# Patient Record
Sex: Male | Born: 1955 | Race: White | Hispanic: No | Marital: Married | Smoking: Never smoker
Health system: Southern US, Community
[De-identification: ages and names within clinical notes are randomized; demographics above are authoritative.]

## PROBLEM LIST (undated history)

## (undated) DIAGNOSIS — Z860100 Personal history of colon polyps, unspecified: Secondary | ICD-10-CM

## (undated) DIAGNOSIS — T7840XA Allergy, unspecified, initial encounter: Secondary | ICD-10-CM

## (undated) DIAGNOSIS — E785 Hyperlipidemia, unspecified: Secondary | ICD-10-CM

## (undated) DIAGNOSIS — I341 Nonrheumatic mitral (valve) prolapse: Secondary | ICD-10-CM

## (undated) DIAGNOSIS — Q211 Atrial septal defect: Secondary | ICD-10-CM

## (undated) DIAGNOSIS — I2510 Atherosclerotic heart disease of native coronary artery without angina pectoris: Secondary | ICD-10-CM

## (undated) DIAGNOSIS — K5900 Constipation, unspecified: Secondary | ICD-10-CM

## (undated) DIAGNOSIS — Z5189 Encounter for other specified aftercare: Secondary | ICD-10-CM

## (undated) DIAGNOSIS — H269 Unspecified cataract: Secondary | ICD-10-CM

## (undated) DIAGNOSIS — I4891 Unspecified atrial fibrillation: Secondary | ICD-10-CM

## (undated) DIAGNOSIS — R011 Cardiac murmur, unspecified: Secondary | ICD-10-CM

## (undated) DIAGNOSIS — N200 Calculus of kidney: Secondary | ICD-10-CM

## (undated) DIAGNOSIS — G4730 Sleep apnea, unspecified: Secondary | ICD-10-CM

## (undated) DIAGNOSIS — M722 Plantar fascial fibromatosis: Secondary | ICD-10-CM

## (undated) DIAGNOSIS — Z8679 Personal history of other diseases of the circulatory system: Secondary | ICD-10-CM

## (undated) DIAGNOSIS — Q2112 Patent foramen ovale: Secondary | ICD-10-CM

## (undated) DIAGNOSIS — I019 Acute rheumatic heart disease, unspecified: Secondary | ICD-10-CM

## (undated) HISTORY — DX: Acute rheumatic heart disease, unspecified: I01.9

## (undated) HISTORY — DX: Nonrheumatic mitral (valve) prolapse: I34.1

## (undated) HISTORY — DX: Atrial septal defect: Q21.1

## (undated) HISTORY — PX: COLONOSCOPY: SHX174

## (undated) HISTORY — DX: Constipation, unspecified: K59.00

## (undated) HISTORY — DX: Patent foramen ovale: Q21.12

## (undated) HISTORY — DX: Hyperlipidemia, unspecified: E78.5

## (undated) HISTORY — DX: Personal history of colonic polyps: Z86.010

## (undated) HISTORY — DX: Allergy, unspecified, initial encounter: T78.40XA

## (undated) HISTORY — DX: Plantar fascial fibromatosis: M72.2

## (undated) HISTORY — DX: Encounter for other specified aftercare: Z51.89

## (undated) HISTORY — DX: Atherosclerotic heart disease of native coronary artery without angina pectoris: I25.10

## (undated) HISTORY — DX: Unspecified cataract: H26.9

## (undated) HISTORY — PX: POLYPECTOMY: SHX149

## (undated) HISTORY — DX: Unspecified atrial fibrillation: I48.91

## (undated) HISTORY — DX: Sleep apnea, unspecified: G47.30

## (undated) HISTORY — DX: Personal history of colon polyps, unspecified: Z86.0100

## (undated) HISTORY — DX: Personal history of other diseases of the circulatory system: Z86.79

## (undated) HISTORY — DX: Calculus of kidney: N20.0

## (undated) HISTORY — DX: Cardiac murmur, unspecified: R01.1

---

## 2001-05-24 ENCOUNTER — Emergency Department (HOSPITAL_COMMUNITY): Admission: EM | Admit: 2001-05-24 | Discharge: 2001-05-24 | Payer: Self-pay | Admitting: Emergency Medicine

## 2001-10-04 ENCOUNTER — Encounter: Payer: Self-pay | Admitting: Emergency Medicine

## 2001-10-04 ENCOUNTER — Emergency Department (HOSPITAL_COMMUNITY): Admission: EM | Admit: 2001-10-04 | Discharge: 2001-10-04 | Payer: Self-pay | Admitting: Emergency Medicine

## 2002-06-23 ENCOUNTER — Emergency Department (HOSPITAL_COMMUNITY): Admission: EM | Admit: 2002-06-23 | Discharge: 2002-06-23 | Payer: Self-pay | Admitting: Emergency Medicine

## 2003-04-02 ENCOUNTER — Encounter: Payer: Self-pay | Admitting: Emergency Medicine

## 2003-04-02 ENCOUNTER — Emergency Department (HOSPITAL_COMMUNITY): Admission: EM | Admit: 2003-04-02 | Discharge: 2003-04-02 | Payer: Self-pay | Admitting: Emergency Medicine

## 2003-05-03 ENCOUNTER — Encounter: Payer: Self-pay | Admitting: Emergency Medicine

## 2003-05-03 ENCOUNTER — Emergency Department (HOSPITAL_COMMUNITY): Admission: EM | Admit: 2003-05-03 | Discharge: 2003-05-03 | Payer: Self-pay | Admitting: *Deleted

## 2004-05-13 ENCOUNTER — Ambulatory Visit (HOSPITAL_COMMUNITY): Admission: RE | Admit: 2004-05-13 | Discharge: 2004-05-13 | Payer: Self-pay | Admitting: General Surgery

## 2006-11-04 ENCOUNTER — Encounter: Admission: RE | Admit: 2006-11-04 | Discharge: 2006-11-04 | Payer: Self-pay | Admitting: Family Medicine

## 2008-03-06 ENCOUNTER — Ambulatory Visit: Payer: Self-pay | Admitting: Internal Medicine

## 2008-03-06 ENCOUNTER — Encounter: Payer: Self-pay | Admitting: Internal Medicine

## 2008-03-06 DIAGNOSIS — J309 Allergic rhinitis, unspecified: Secondary | ICD-10-CM

## 2008-03-06 DIAGNOSIS — R079 Chest pain, unspecified: Secondary | ICD-10-CM

## 2008-03-06 DIAGNOSIS — I059 Rheumatic mitral valve disease, unspecified: Secondary | ICD-10-CM | POA: Insufficient documentation

## 2008-03-06 DIAGNOSIS — R5381 Other malaise: Secondary | ICD-10-CM | POA: Insufficient documentation

## 2008-03-06 DIAGNOSIS — R5383 Other fatigue: Secondary | ICD-10-CM

## 2008-03-06 DIAGNOSIS — R011 Cardiac murmur, unspecified: Secondary | ICD-10-CM

## 2008-03-06 DIAGNOSIS — R519 Headache, unspecified: Secondary | ICD-10-CM | POA: Insufficient documentation

## 2008-03-06 DIAGNOSIS — Z87442 Personal history of urinary calculi: Secondary | ICD-10-CM

## 2008-03-06 DIAGNOSIS — R51 Headache: Secondary | ICD-10-CM

## 2008-03-06 DIAGNOSIS — F528 Other sexual dysfunction not due to a substance or known physiological condition: Secondary | ICD-10-CM | POA: Insufficient documentation

## 2008-03-06 DIAGNOSIS — E785 Hyperlipidemia, unspecified: Secondary | ICD-10-CM

## 2008-03-06 LAB — CONVERTED CEMR LAB
Blood in Urine, dipstick: NEGATIVE
Ketones, urine, test strip: NEGATIVE
Nitrite: NEGATIVE
Urobilinogen, UA: 0.2
WBC Urine, dipstick: NEGATIVE

## 2008-03-09 ENCOUNTER — Encounter: Payer: Self-pay | Admitting: Internal Medicine

## 2008-04-05 LAB — CONVERTED CEMR LAB
ALT: 30 units/L (ref 0–53)
AST: 25 units/L (ref 0–37)
Basophils Relative: 0.7 % (ref 0.0–1.0)
Bilirubin, Direct: 0.2 mg/dL (ref 0.0–0.3)
CO2: 33 meq/L — ABNORMAL HIGH (ref 19–32)
Calcium: 9.8 mg/dL (ref 8.4–10.5)
Chloride: 105 meq/L (ref 96–112)
Creatinine, Ser: 1 mg/dL (ref 0.4–1.5)
Eosinophils Relative: 2.2 % (ref 0.0–5.0)
Glucose, Bld: 92 mg/dL (ref 70–99)
HDL: 49.7 mg/dL (ref >39.0)
Platelets: 199 10*3/uL (ref 150–400)
RBC: 4.99 M/uL (ref 4.22–5.81)
TSH: 1.67 microintl units/mL (ref 0.35–5.50)
Total Protein: 7.1 g/dL (ref 6.0–8.3)
Triglycerides: 94 mg/dL (ref 0–149)
VLDL: 19 mg/dL (ref 0–40)
WBC: 5.2 10*3/uL (ref 4.5–10.5)

## 2008-04-28 ENCOUNTER — Encounter: Payer: Self-pay | Admitting: Internal Medicine

## 2008-09-29 ENCOUNTER — Telehealth (INDEPENDENT_AMBULATORY_CARE_PROVIDER_SITE_OTHER): Payer: Self-pay | Admitting: *Deleted

## 2008-11-06 ENCOUNTER — Ambulatory Visit: Payer: Self-pay | Admitting: Internal Medicine

## 2008-11-06 DIAGNOSIS — R12 Heartburn: Secondary | ICD-10-CM

## 2008-11-06 LAB — CONVERTED CEMR LAB
Ketones, urine, test strip: NEGATIVE
Nitrite: NEGATIVE
Specific Gravity, Urine: 1.015
Urobilinogen, UA: 0.2

## 2008-12-01 ENCOUNTER — Encounter: Payer: Self-pay | Admitting: Internal Medicine

## 2008-12-01 ENCOUNTER — Ambulatory Visit: Payer: Self-pay

## 2008-12-01 ENCOUNTER — Ambulatory Visit: Payer: Self-pay | Admitting: Cardiology

## 2008-12-11 ENCOUNTER — Ambulatory Visit: Payer: Self-pay | Admitting: Cardiovascular Disease

## 2008-12-11 ENCOUNTER — Ambulatory Visit (HOSPITAL_COMMUNITY): Admission: RE | Admit: 2008-12-11 | Discharge: 2008-12-11 | Payer: Self-pay | Admitting: Cardiology

## 2008-12-12 ENCOUNTER — Encounter: Payer: Self-pay | Admitting: Internal Medicine

## 2008-12-12 LAB — CONVERTED CEMR LAB
AST: 23 units/L (ref 0–37)
Alkaline Phosphatase: 46 units/L (ref 39–117)
Basophils Absolute: 0 10*3/uL (ref 0.0–0.1)
Bilirubin, Direct: 0.1 mg/dL (ref 0.0–0.3)
Chloride: 105 meq/L (ref 96–112)
Eosinophils Absolute: 0.2 10*3/uL (ref 0.0–0.7)
Eosinophils Relative: 3 % (ref 0.0–5.0)
GFR calc non Af Amer: 95 mL/min
HDL: 41.6 mg/dL (ref >39.0)
MCHC: 34.8 g/dL (ref 30.0–36.0)
MCV: 90 fL (ref 78.0–100.0)
Neutrophils Relative %: 52.4 % (ref 43.0–77.0)
Platelets: 172 10*3/uL (ref 150–400)
Potassium: 4.1 meq/L (ref 3.5–5.1)
Sodium: 140 meq/L (ref 135–145)
Total Bilirubin: 0.9 mg/dL (ref 0.3–1.2)
Triglycerides: 100 mg/dL (ref 0–149)
WBC: 5.1 10*3/uL (ref 4.5–10.5)

## 2008-12-14 ENCOUNTER — Ambulatory Visit: Payer: Self-pay | Admitting: Cardiology

## 2008-12-19 ENCOUNTER — Inpatient Hospital Stay (HOSPITAL_BASED_OUTPATIENT_CLINIC_OR_DEPARTMENT_OTHER): Admission: RE | Admit: 2008-12-19 | Discharge: 2008-12-19 | Payer: Self-pay | Admitting: Cardiology

## 2008-12-19 ENCOUNTER — Ambulatory Visit: Payer: Self-pay | Admitting: Cardiology

## 2008-12-22 DIAGNOSIS — Z5189 Encounter for other specified aftercare: Secondary | ICD-10-CM

## 2008-12-22 HISTORY — PX: THORACOTOMY: SUR1349

## 2008-12-22 HISTORY — DX: Encounter for other specified aftercare: Z51.89

## 2008-12-22 HISTORY — PX: PATENT FORAMEN OVALE CLOSURE: SHX2181

## 2008-12-25 ENCOUNTER — Ambulatory Visit: Payer: Self-pay | Admitting: Thoracic Surgery (Cardiothoracic Vascular Surgery)

## 2009-01-19 ENCOUNTER — Ambulatory Visit (HOSPITAL_COMMUNITY)
Admission: RE | Admit: 2009-01-19 | Discharge: 2009-01-19 | Payer: Self-pay | Admitting: Thoracic Surgery (Cardiothoracic Vascular Surgery)

## 2009-01-19 ENCOUNTER — Ambulatory Visit: Payer: Self-pay | Admitting: Vascular Surgery

## 2009-01-19 ENCOUNTER — Encounter: Payer: Self-pay | Admitting: Thoracic Surgery (Cardiothoracic Vascular Surgery)

## 2009-01-22 ENCOUNTER — Ambulatory Visit: Payer: Self-pay | Admitting: Thoracic Surgery (Cardiothoracic Vascular Surgery)

## 2009-01-23 ENCOUNTER — Inpatient Hospital Stay (HOSPITAL_COMMUNITY)
Admission: RE | Admit: 2009-01-23 | Discharge: 2009-01-27 | Payer: Self-pay | Admitting: Thoracic Surgery (Cardiothoracic Vascular Surgery)

## 2009-01-23 ENCOUNTER — Encounter: Payer: Self-pay | Admitting: Thoracic Surgery (Cardiothoracic Vascular Surgery)

## 2009-01-23 ENCOUNTER — Ambulatory Visit: Payer: Self-pay | Admitting: Thoracic Surgery (Cardiothoracic Vascular Surgery)

## 2009-01-30 ENCOUNTER — Ambulatory Visit: Payer: Self-pay | Admitting: Thoracic Surgery (Cardiothoracic Vascular Surgery)

## 2009-01-30 ENCOUNTER — Ambulatory Visit: Payer: Self-pay | Admitting: Cardiology

## 2009-01-30 ENCOUNTER — Encounter: Payer: Self-pay | Admitting: Internal Medicine

## 2009-02-05 ENCOUNTER — Encounter
Admission: RE | Admit: 2009-02-05 | Discharge: 2009-02-05 | Payer: Self-pay | Admitting: Thoracic Surgery (Cardiothoracic Vascular Surgery)

## 2009-02-05 ENCOUNTER — Ambulatory Visit: Payer: Self-pay | Admitting: Thoracic Surgery (Cardiothoracic Vascular Surgery)

## 2009-02-05 ENCOUNTER — Ambulatory Visit: Payer: Self-pay | Admitting: Internal Medicine

## 2009-02-05 ENCOUNTER — Encounter: Payer: Self-pay | Admitting: Internal Medicine

## 2009-02-10 DIAGNOSIS — I080 Rheumatic disorders of both mitral and aortic valves: Secondary | ICD-10-CM

## 2009-02-10 DIAGNOSIS — Q211 Atrial septal defect: Secondary | ICD-10-CM

## 2009-02-10 DIAGNOSIS — I4891 Unspecified atrial fibrillation: Secondary | ICD-10-CM

## 2009-02-10 HISTORY — DX: Unspecified atrial fibrillation: I48.91

## 2009-02-12 ENCOUNTER — Encounter: Payer: Self-pay | Admitting: Cardiology

## 2009-02-12 ENCOUNTER — Ambulatory Visit: Payer: Self-pay | Admitting: Cardiology

## 2009-02-12 ENCOUNTER — Ambulatory Visit: Payer: Self-pay | Admitting: Cardiovascular Disease

## 2009-02-26 ENCOUNTER — Ambulatory Visit: Payer: Self-pay | Admitting: Internal Medicine

## 2009-03-01 ENCOUNTER — Encounter: Payer: Self-pay | Admitting: Cardiology

## 2009-03-01 ENCOUNTER — Encounter (HOSPITAL_COMMUNITY): Admission: RE | Admit: 2009-03-01 | Discharge: 2009-05-30 | Payer: Self-pay | Admitting: Cardiology

## 2009-03-05 ENCOUNTER — Encounter: Payer: Self-pay | Admitting: Internal Medicine

## 2009-03-05 ENCOUNTER — Ambulatory Visit: Payer: Self-pay | Admitting: Thoracic Surgery (Cardiothoracic Vascular Surgery)

## 2009-03-19 ENCOUNTER — Ambulatory Visit: Payer: Self-pay | Admitting: Internal Medicine

## 2009-03-26 ENCOUNTER — Ambulatory Visit: Payer: Self-pay | Admitting: Internal Medicine

## 2009-03-26 DIAGNOSIS — M79609 Pain in unspecified limb: Secondary | ICD-10-CM | POA: Insufficient documentation

## 2009-03-27 ENCOUNTER — Encounter: Payer: Self-pay | Admitting: Internal Medicine

## 2009-04-02 ENCOUNTER — Ambulatory Visit: Payer: Self-pay | Admitting: Internal Medicine

## 2009-04-13 ENCOUNTER — Ambulatory Visit: Payer: Self-pay | Admitting: Cardiology

## 2009-04-20 ENCOUNTER — Ambulatory Visit: Payer: Self-pay | Admitting: Cardiology

## 2009-04-20 ENCOUNTER — Encounter: Payer: Self-pay | Admitting: Cardiology

## 2009-04-25 ENCOUNTER — Ambulatory Visit: Payer: Self-pay | Admitting: Internal Medicine

## 2009-04-25 ENCOUNTER — Encounter: Payer: Self-pay | Admitting: Cardiology

## 2009-04-25 DIAGNOSIS — R635 Abnormal weight gain: Secondary | ICD-10-CM | POA: Insufficient documentation

## 2009-04-27 ENCOUNTER — Ambulatory Visit: Payer: Self-pay | Admitting: Cardiovascular Disease

## 2009-05-04 ENCOUNTER — Telehealth: Payer: Self-pay | Admitting: Cardiology

## 2009-05-04 LAB — CONVERTED CEMR LAB
Alkaline Phosphatase: 59 units/L (ref 39–117)
BUN: 16 mg/dL (ref 6–23)
Basophils Relative: 0.1 % (ref 0.0–3.0)
Bilirubin, Direct: 0 mg/dL (ref 0.0–0.3)
Calcium: 9.2 mg/dL (ref 8.4–10.5)
Cholesterol: 232 mg/dL — ABNORMAL HIGH (ref 0–200)
Creatinine, Ser: 1 mg/dL (ref 0.4–1.5)
Eosinophils Absolute: 0.1 10*3/uL (ref 0.0–0.7)
Eosinophils Relative: 2.7 % (ref 0.0–5.0)
Free T4: 0.9 ng/dL (ref 0.6–1.6)
Lymphocytes Relative: 35.9 % (ref 12.0–46.0)
MCHC: 33.7 g/dL (ref 30.0–36.0)
Neutrophils Relative %: 53.7 % (ref 43.0–77.0)
Platelets: 185 10*3/uL (ref 150.0–400.0)
RBC: 4.88 M/uL (ref 4.22–5.81)
T3, Free: 3.3 pg/mL (ref 2.3–4.2)
Total Bilirubin: 1.1 mg/dL (ref 0.3–1.2)
Total CHOL/HDL Ratio: 6
Total Protein: 7.2 g/dL (ref 6.0–8.3)
Triglycerides: 249 mg/dL — ABNORMAL HIGH (ref 0.0–149.0)
VLDL: 49.8 mg/dL — ABNORMAL HIGH (ref 0.0–40.0)
WBC: 5.2 10*3/uL (ref 4.5–10.5)

## 2009-05-09 ENCOUNTER — Encounter: Payer: Self-pay | Admitting: Cardiology

## 2009-05-09 ENCOUNTER — Ambulatory Visit: Payer: Self-pay | Admitting: Cardiology

## 2009-05-10 ENCOUNTER — Ambulatory Visit: Payer: Self-pay | Admitting: Cardiology

## 2009-05-23 ENCOUNTER — Ambulatory Visit: Payer: Self-pay | Admitting: Cardiology

## 2009-05-23 ENCOUNTER — Encounter: Payer: Self-pay | Admitting: *Deleted

## 2009-05-23 LAB — CONVERTED CEMR LAB
POC INR: 2
Protime: 17.6

## 2009-05-28 ENCOUNTER — Encounter: Payer: Self-pay | Admitting: Cardiology

## 2009-05-28 ENCOUNTER — Ambulatory Visit: Payer: Self-pay

## 2009-05-29 ENCOUNTER — Encounter: Payer: Self-pay | Admitting: Cardiology

## 2009-05-31 ENCOUNTER — Encounter (HOSPITAL_COMMUNITY): Admission: RE | Admit: 2009-05-31 | Discharge: 2009-06-19 | Payer: Self-pay | Admitting: Cardiology

## 2009-06-01 ENCOUNTER — Telehealth: Payer: Self-pay | Admitting: Cardiology

## 2009-06-07 ENCOUNTER — Encounter: Payer: Self-pay | Admitting: Cardiology

## 2009-06-21 ENCOUNTER — Encounter (HOSPITAL_COMMUNITY): Admission: RE | Admit: 2009-06-21 | Discharge: 2009-06-26 | Payer: Self-pay | Admitting: Cardiology

## 2009-06-27 ENCOUNTER — Encounter: Payer: Self-pay | Admitting: *Deleted

## 2009-07-18 ENCOUNTER — Encounter: Payer: Self-pay | Admitting: Cardiology

## 2009-07-18 ENCOUNTER — Ambulatory Visit: Payer: Self-pay | Admitting: Thoracic Surgery (Cardiothoracic Vascular Surgery)

## 2009-07-18 ENCOUNTER — Encounter: Payer: Self-pay | Admitting: Internal Medicine

## 2009-08-03 ENCOUNTER — Encounter: Payer: Self-pay | Admitting: Cardiology

## 2009-08-06 ENCOUNTER — Encounter (INDEPENDENT_AMBULATORY_CARE_PROVIDER_SITE_OTHER): Payer: Self-pay | Admitting: *Deleted

## 2009-08-28 ENCOUNTER — Encounter: Payer: Self-pay | Admitting: *Deleted

## 2009-10-11 ENCOUNTER — Telehealth: Payer: Self-pay | Admitting: Internal Medicine

## 2009-10-12 ENCOUNTER — Ambulatory Visit: Payer: Self-pay | Admitting: Internal Medicine

## 2009-10-12 DIAGNOSIS — R0989 Other specified symptoms and signs involving the circulatory and respiratory systems: Secondary | ICD-10-CM

## 2009-10-12 DIAGNOSIS — R6882 Decreased libido: Secondary | ICD-10-CM

## 2009-10-12 DIAGNOSIS — R0609 Other forms of dyspnea: Secondary | ICD-10-CM

## 2009-10-12 LAB — CONVERTED CEMR LAB
AST: 28 units/L (ref 0–37)
Albumin: 4.2 g/dL (ref 3.5–5.2)
Alkaline Phosphatase: 58 units/L (ref 39–117)
BUN: 16 mg/dL (ref 6–23)
Basophils Absolute: 0 10*3/uL (ref 0.0–0.1)
Calcium: 9 mg/dL (ref 8.4–10.5)
Creatinine, Ser: 1 mg/dL (ref 0.4–1.5)
Eosinophils Relative: 3 % (ref 0.0–5.0)
GFR calc non Af Amer: 83.12 mL/min (ref >60)
Glucose, Bld: 101 mg/dL — ABNORMAL HIGH (ref 70–99)
HDL: 43.3 mg/dL (ref >39.00)
LDL Cholesterol: 121 mg/dL — ABNORMAL HIGH (ref 0–99)
Lymphocytes Relative: 36.3 % (ref 12.0–46.0)
Lymphs Abs: 1.7 10*3/uL (ref 0.7–4.0)
Monocytes Relative: 6.1 % (ref 3.0–12.0)
Neutrophils Relative %: 54.3 % (ref 43.0–77.0)
Platelets: 180 10*3/uL (ref 150.0–400.0)
Potassium: 4.8 meq/L (ref 3.5–5.1)
RDW: 12.3 % (ref 11.5–14.6)
TSH: 0.67 microintl units/mL (ref 0.35–5.50)
Testosterone: 220.67 ng/dL — ABNORMAL LOW (ref 350.00–890.00)
Total Bilirubin: 1.1 mg/dL (ref 0.3–1.2)
Total CK: 127 units/L (ref 7–232)
VLDL: 22.4 mg/dL (ref 0.0–40.0)
Vitamin B-12: 536 pg/mL (ref 211–911)
WBC: 4.8 10*3/uL (ref 4.5–10.5)

## 2009-10-14 DIAGNOSIS — E291 Testicular hypofunction: Secondary | ICD-10-CM

## 2009-10-17 LAB — CONVERTED CEMR LAB: PSA: 0.69 ng/mL (ref 0.10–4.00)

## 2009-10-24 ENCOUNTER — Ambulatory Visit: Payer: Self-pay | Admitting: Pulmonary Disease

## 2009-10-24 DIAGNOSIS — G4710 Hypersomnia, unspecified: Secondary | ICD-10-CM

## 2009-11-08 ENCOUNTER — Ambulatory Visit: Payer: Self-pay | Admitting: Internal Medicine

## 2009-11-23 ENCOUNTER — Encounter: Payer: Self-pay | Admitting: Pulmonary Disease

## 2009-11-23 ENCOUNTER — Ambulatory Visit (HOSPITAL_BASED_OUTPATIENT_CLINIC_OR_DEPARTMENT_OTHER): Admission: RE | Admit: 2009-11-23 | Discharge: 2009-11-23 | Payer: Self-pay | Admitting: Pulmonary Disease

## 2009-12-06 ENCOUNTER — Ambulatory Visit: Payer: Self-pay | Admitting: Pulmonary Disease

## 2009-12-06 ENCOUNTER — Telehealth (INDEPENDENT_AMBULATORY_CARE_PROVIDER_SITE_OTHER): Payer: Self-pay | Admitting: *Deleted

## 2009-12-12 ENCOUNTER — Encounter (INDEPENDENT_AMBULATORY_CARE_PROVIDER_SITE_OTHER): Payer: Self-pay | Admitting: *Deleted

## 2009-12-13 ENCOUNTER — Ambulatory Visit: Payer: Self-pay | Admitting: Gastroenterology

## 2009-12-13 ENCOUNTER — Ambulatory Visit: Payer: Self-pay | Admitting: Pulmonary Disease

## 2009-12-13 DIAGNOSIS — G4733 Obstructive sleep apnea (adult) (pediatric): Secondary | ICD-10-CM

## 2009-12-19 ENCOUNTER — Telehealth: Payer: Self-pay | Admitting: *Deleted

## 2009-12-20 ENCOUNTER — Ambulatory Visit: Payer: Self-pay | Admitting: Gastroenterology

## 2010-01-10 ENCOUNTER — Encounter: Payer: Self-pay | Admitting: Gastroenterology

## 2010-01-11 ENCOUNTER — Ambulatory Visit: Payer: Self-pay | Admitting: Internal Medicine

## 2010-01-11 LAB — CONVERTED CEMR LAB: Testosterone: 225.57 ng/dL — ABNORMAL LOW (ref 350.00–890.00)

## 2010-01-28 ENCOUNTER — Ambulatory Visit: Payer: Self-pay | Admitting: Internal Medicine

## 2010-02-18 IMAGING — CR DG CHEST 1V PORT
1 series · 1 of 1 positions shown · non-contrast
Comparison: 01/23/2009

CLINICAL DATA: Mitral regurgitation

PORTABLE CHEST - 1 VIEW

[view not recorded]
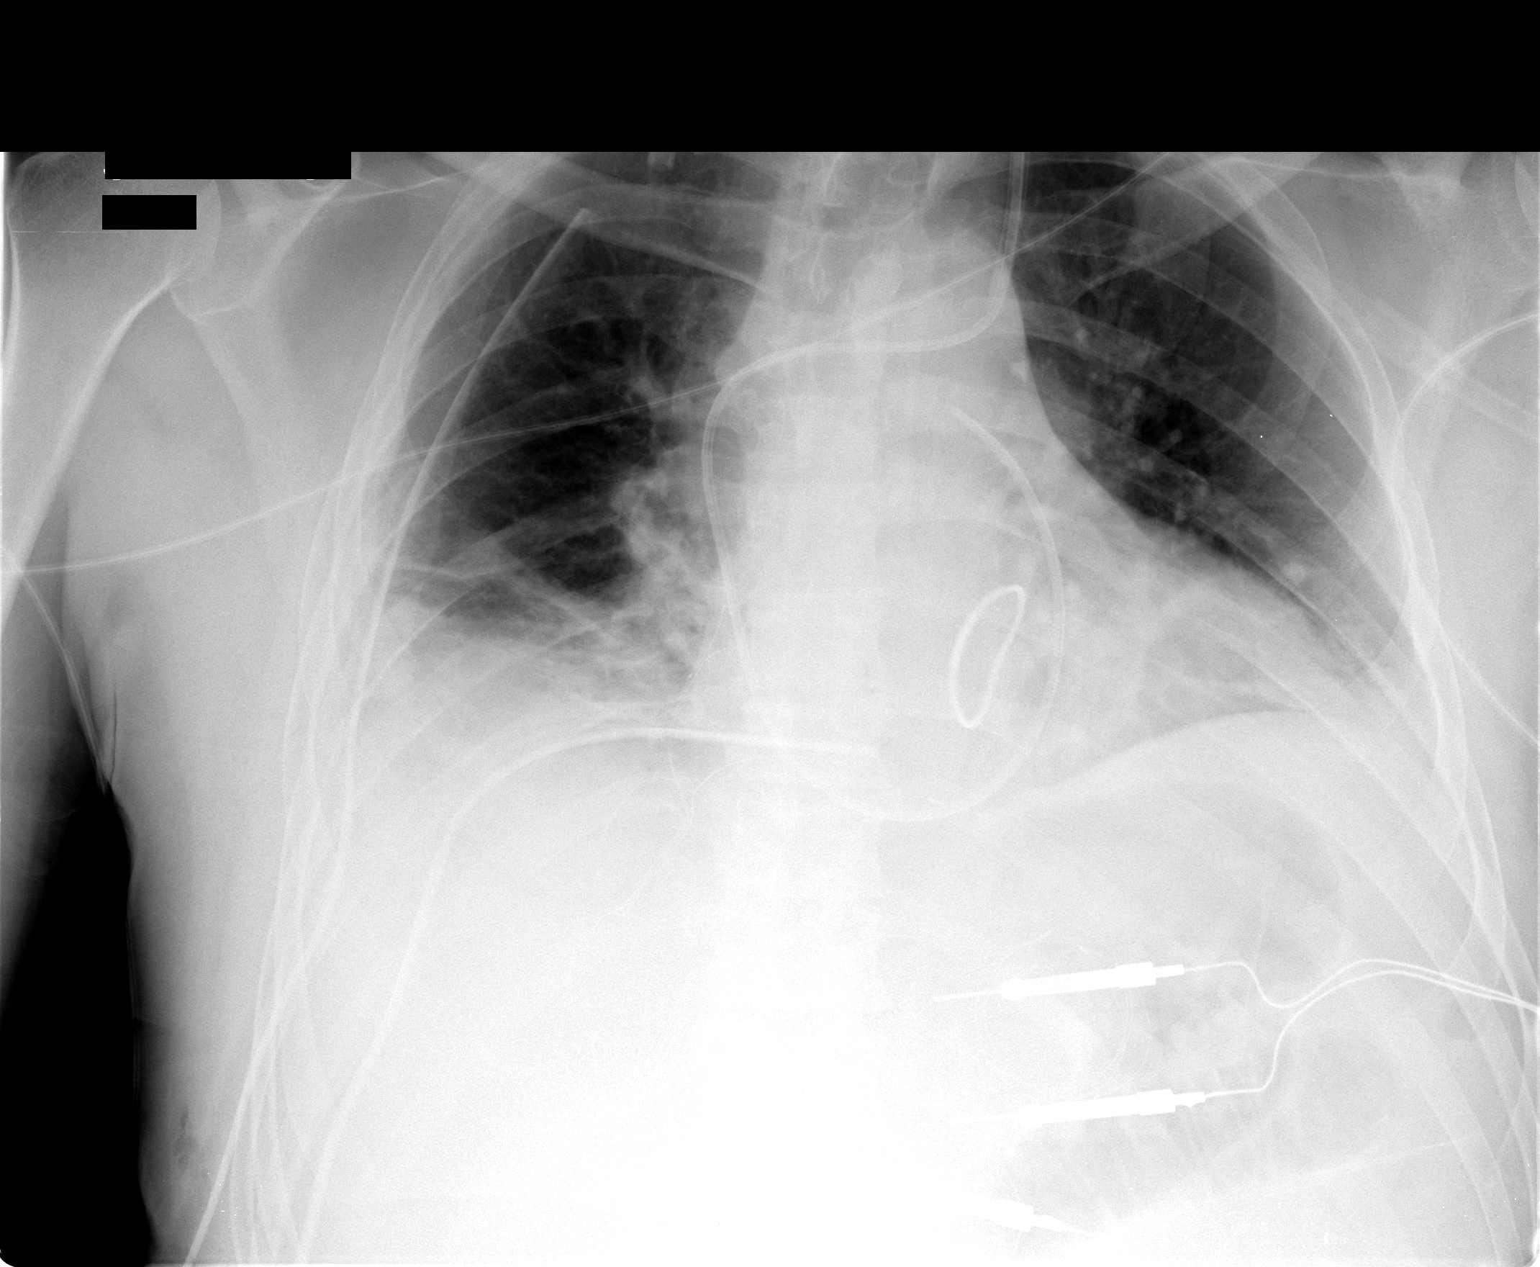

[1 of 1 positions shown; findings below may reference images not displayed]

FINDINGS: Cardiomediastinal silhouette is stable.  Bilateral
basilar atelectasis again noted without significant change in
aeration.  No diagnostic pneumothorax.  No change in left Swan-Ganz
catheter position.  Mitral valve prosthesis again noted.  Right
chest tubes are unchanged in position.  No pulmonary edema.
Endotracheal tube and NG tube has been removed.
IMPRESSION: Endotracheal tube and NG tube has been removed.  Bilateral basilar
atelectasis right greater than left again noted. Stable right chest
tubes .  No diagnostic pneumothorax.

## 2010-02-20 IMAGING — CR DG CHEST 2V
2 series · 2 of 2 positions shown · non-contrast
Comparison: Chest x-ray of 01/25/2009

CLINICAL DATA: Postop for surgery for mitral regurgitation

CHEST - 2 VIEW

[w chest pa]
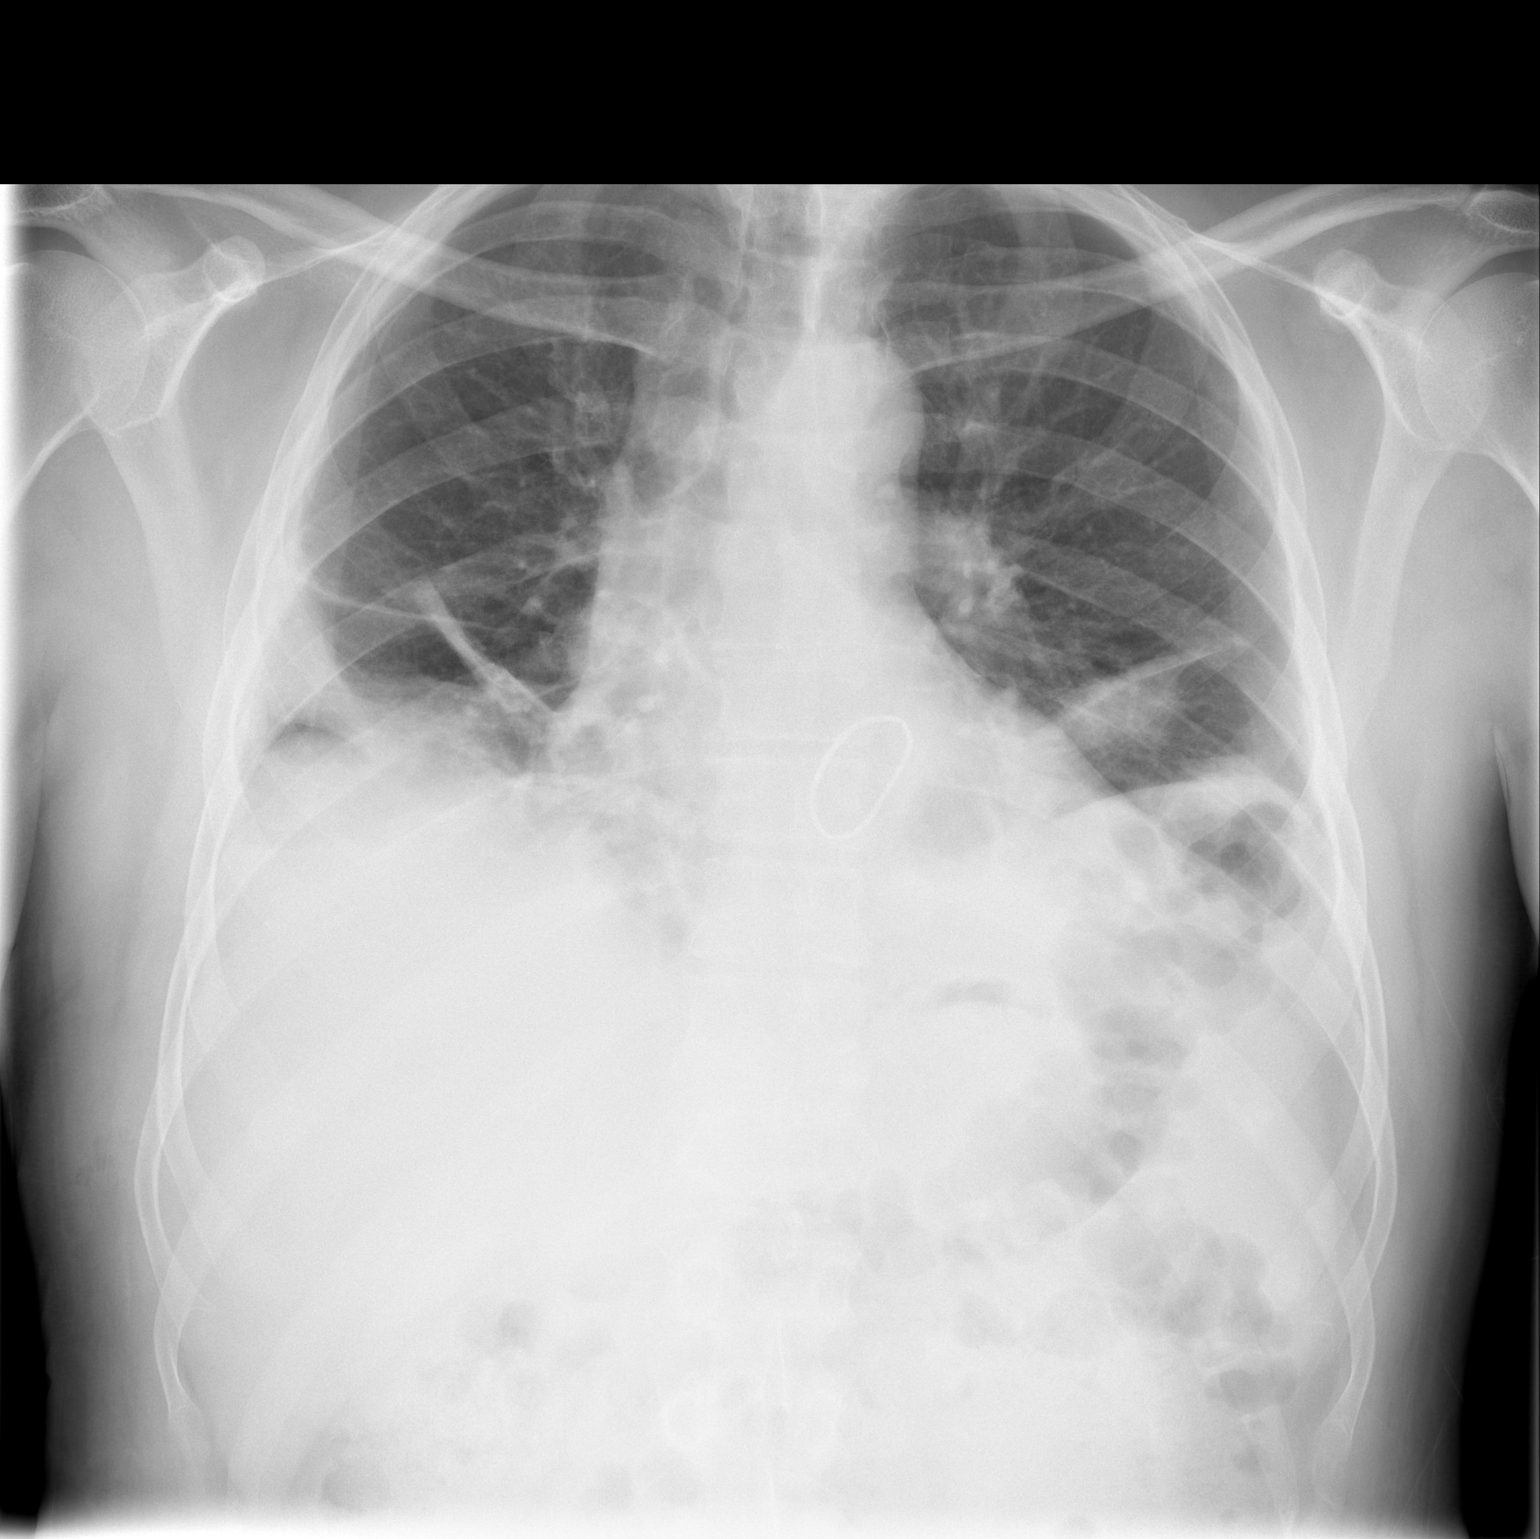

[w chest lat]
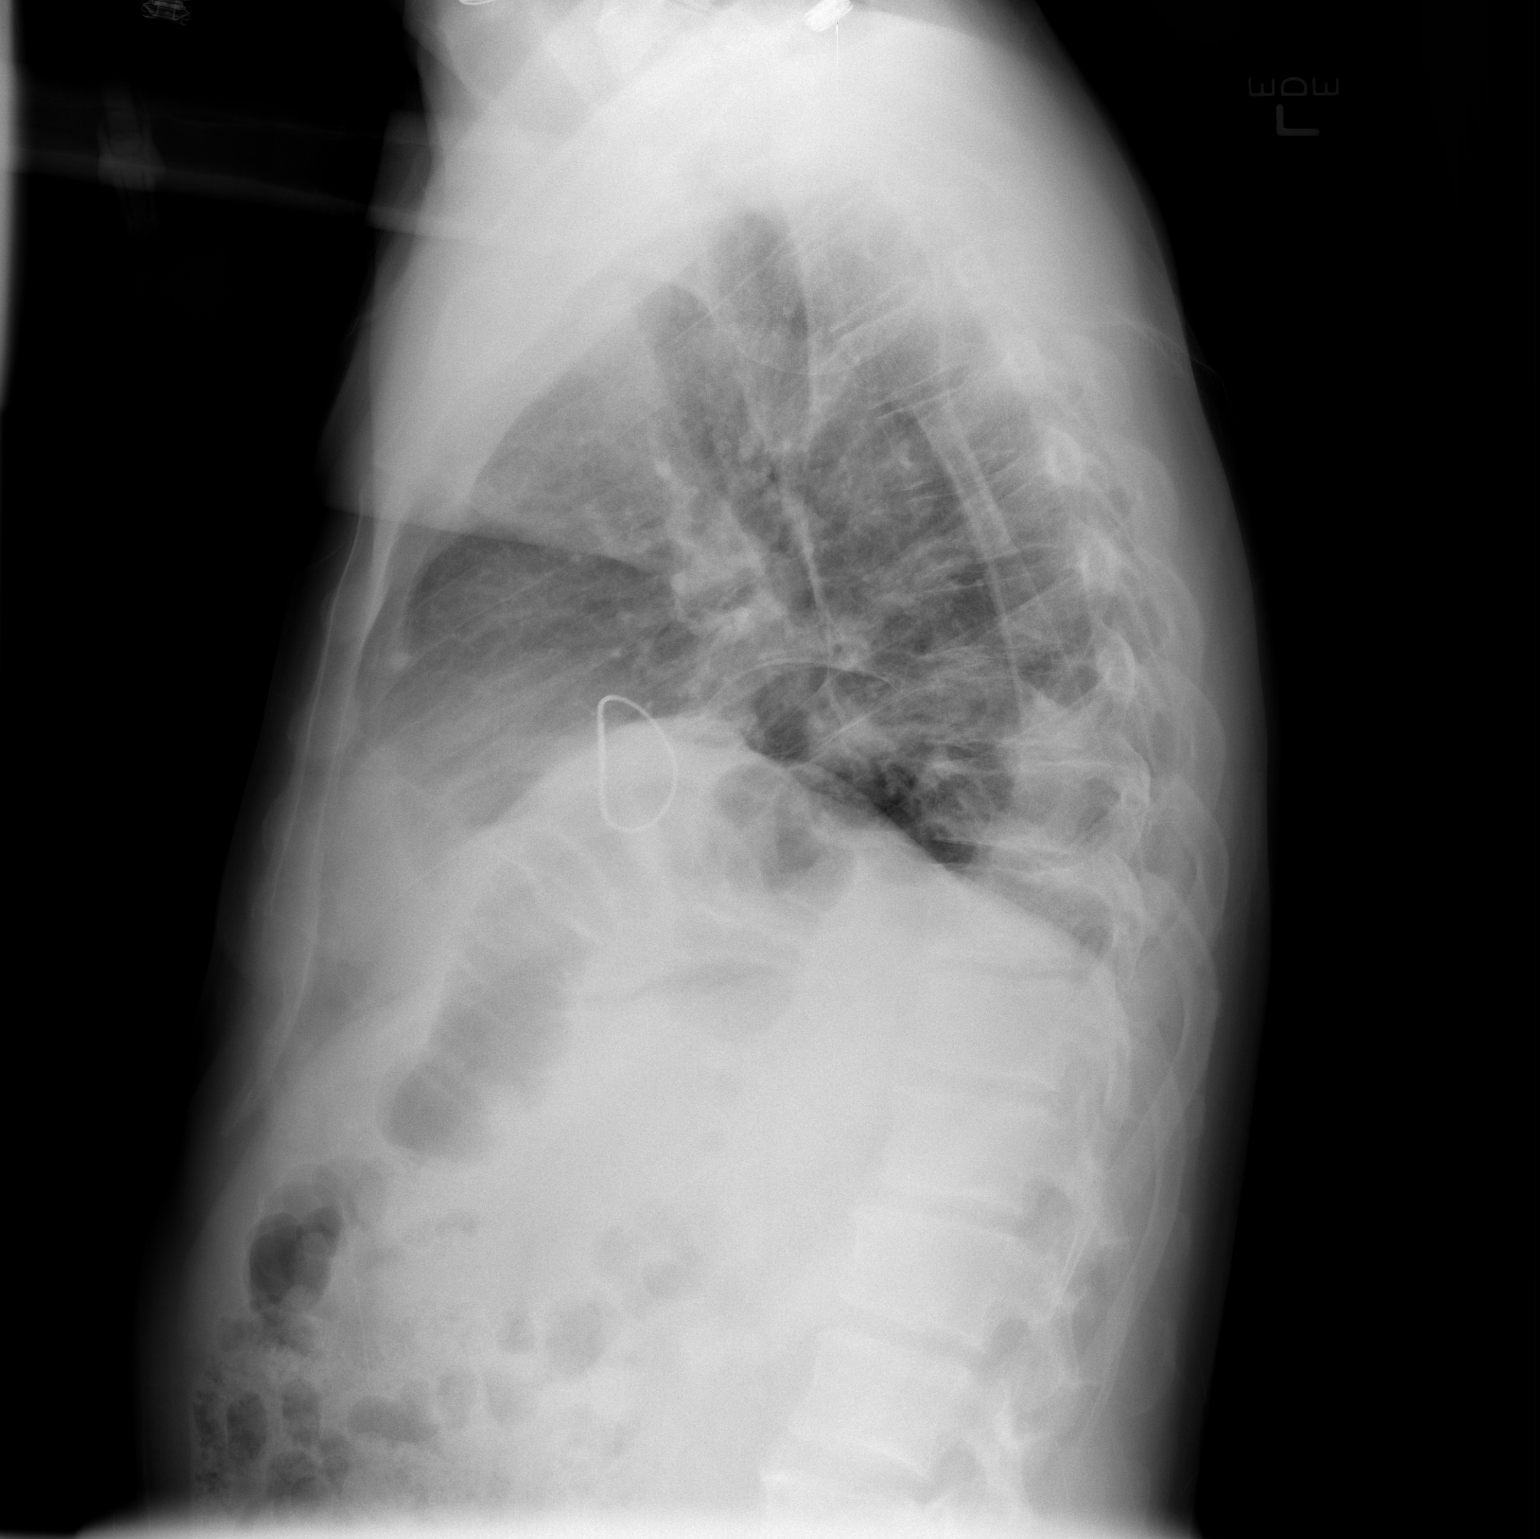

[2 of 2 positions shown; findings below may reference images not displayed]

FINDINGS: The right chest tube has been removed.  Aeration has
improved slightly.  Bibasilar opacities remain most consistent with
atelectasis and possible small right effusion.  Heart size is
stable.  No pneumothorax is noted.
IMPRESSION: Right chest tube removed.  Minimally better aeration.  Persistent
basilar atelectasis.

## 2010-04-16 ENCOUNTER — Telehealth: Payer: Self-pay | Admitting: *Deleted

## 2010-05-13 ENCOUNTER — Ambulatory Visit: Payer: Self-pay | Admitting: Family Medicine

## 2010-05-14 LAB — CONVERTED CEMR LAB
ALT: 31 units/L (ref 0–53)
AST: 21 units/L (ref 0–37)
Albumin: 4.4 g/dL (ref 3.5–5.2)
Chloride: 103 meq/L (ref 96–112)
Eosinophils Relative: 0.4 % (ref 0.0–5.0)
GFR calc non Af Amer: 90.18 mL/min (ref >60)
Glucose, Bld: 89 mg/dL (ref 70–99)
HCT: 43.2 % (ref 39.0–52.0)
Hemoglobin: 14.9 g/dL (ref 13.0–17.0)
Lymphs Abs: 2 10*3/uL (ref 0.7–4.0)
MCV: 89.2 fL (ref 78.0–100.0)
Monocytes Absolute: 0.4 10*3/uL (ref 0.1–1.0)
Monocytes Relative: 5.7 % (ref 3.0–12.0)
Neutro Abs: 3.9 10*3/uL (ref 1.4–7.7)
Potassium: 5 meq/L (ref 3.5–5.1)
Sodium: 142 meq/L (ref 135–145)
TSH: 0.76 microintl units/mL (ref 0.35–5.50)
Total Protein: 7.3 g/dL (ref 6.0–8.3)
Vitamin B-12: 475 pg/mL (ref 211–911)
WBC: 6.4 10*3/uL (ref 4.5–10.5)

## 2010-05-23 ENCOUNTER — Encounter: Payer: Self-pay | Admitting: Cardiology

## 2010-05-27 ENCOUNTER — Ambulatory Visit: Payer: Self-pay | Admitting: Cardiology

## 2010-06-28 ENCOUNTER — Ambulatory Visit (HOSPITAL_COMMUNITY): Admission: RE | Admit: 2010-06-28 | Discharge: 2010-06-28 | Payer: Self-pay | Admitting: Cardiology

## 2010-06-28 ENCOUNTER — Ambulatory Visit: Payer: Self-pay

## 2010-06-28 ENCOUNTER — Encounter: Payer: Self-pay | Admitting: Cardiology

## 2010-06-28 ENCOUNTER — Ambulatory Visit: Payer: Self-pay | Admitting: Cardiovascular Disease

## 2010-06-28 ENCOUNTER — Ambulatory Visit: Payer: Self-pay | Admitting: Cardiology

## 2010-07-23 ENCOUNTER — Encounter: Payer: Self-pay | Admitting: Internal Medicine

## 2010-08-16 ENCOUNTER — Encounter: Payer: Self-pay | Admitting: Internal Medicine

## 2010-10-11 ENCOUNTER — Encounter: Payer: Self-pay | Admitting: Internal Medicine

## 2010-12-06 ENCOUNTER — Encounter: Payer: Self-pay | Admitting: Internal Medicine

## 2010-12-09 ENCOUNTER — Ambulatory Visit (HOSPITAL_COMMUNITY)
Admission: RE | Admit: 2010-12-09 | Discharge: 2010-12-10 | Payer: Self-pay | Source: Home / Self Care | Attending: Neurosurgery | Admitting: Neurosurgery

## 2010-12-09 HISTORY — PX: SPINE SURGERY: SHX786

## 2010-12-13 ENCOUNTER — Encounter: Payer: Self-pay | Admitting: Internal Medicine

## 2010-12-22 HISTORY — PX: HERNIA REPAIR: SHX51

## 2011-01-21 NOTE — Letter (Signed)
Summary: [facility]  [facility]   Imported By: Maryln Gottron 09/03/2010 13:22:02  _____________________________________________________________________  External Attachment:    Type:   Image     Comment:   External Document

## 2011-01-21 NOTE — Letter (Signed)
Summary: [facility]  [facility]   Imported By: Sherian Rein 08/30/2010 11:23:23  _____________________________________________________________________  External Attachment:    Type:   Image     Comment:   External Document

## 2011-01-21 NOTE — Assessment & Plan Note (Signed)
Summary: weakness arms and legs/lightheaded/low bp/cjr   Vital Signs:  Patient profile:   55 year old male Weight:      191 pounds BMI:     27.50 Temp:     98.3 degrees F oral Pulse rate:   92 / minute Pulse rhythm:   regular BP sitting:   120 / 84  (left arm) Cuff size:   regular  Vitals Entered By: Raechel Ache, RN (May 13, 2010 10:50 AM) CC: C/o tired, dizziness, arms, legs and chest heavy- says BP has been low.   History of Present Illness: Here with his wife for fatigue. He has been dealing with this for several years, but it became more profound over this past weekend. he describes feeling weak and lightheaded, but no SOB or palpitations or chest pain. No other specific symptoms. he last saw Dr. Antoine Poche one year ago.   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       LAST Td: 4 years ago Colonscopy: n/a Humphrey saw him   in the past.     hx of Atrial fibrillation, sees Dr. Antoine Poche  Past Surgical History: Reviewed history from 01/28/2010 and no changes required.  Right miniature thoracotomy for mitral valve repair   (quadrangular resection of posterior leaflet with transposition of   native chordae tendineae x2 and 34-mm Edwards Physio II ring   annuloplasty) and closure of patent foramen ovale.  Inguinal herniorrhaphy   3 years ago.      Left testicular   trauma wrestling.   [facility]   2007   Review of Systems  The patient denies anorexia, fever, weight loss, weight gain, vision loss, decreased hearing, hoarseness, chest pain, syncope, dyspnea on exertion, peripheral edema, prolonged cough, headaches, hemoptysis, abdominal pain, melena, hematochezia, severe indigestion/heartburn, hematuria, incontinence, genital sores, muscle weakness, suspicious skin lesions,  transient blindness, difficulty walking, depression, unusual weight change, abnormal bleeding, enlarged lymph nodes, angioedema, breast masses, and testicular masses.    Physical Exam  General:  Well-developed,well-nourished,in no acute distress; alert,appropriate and cooperative throughout examination Head:  Normocephalic and atraumatic without obvious abnormalities. No apparent alopecia or balding. Eyes:  No corneal or conjunctival inflammation noted. EOMI. Perrla. Funduscopic exam benign, without hemorrhages, exudates or papilledema. Vision grossly normal. Ears:  External ear exam shows no significant lesions or deformities.  Otoscopic examination reveals clear canals, tympanic membranes are intact bilaterally without bulging, retraction, inflammation or discharge. Hearing is grossly normal bilaterally. Nose:  External nasal examination shows no deformity or inflammation. Nasal mucosa are pink and moist without lesions or exudates. Mouth:  Oral mucosa and oropharynx without lesions or exudates.  Teeth in good repair. Neck:  No deformities, masses, or tenderness noted. Lungs:  Normal respiratory effort, chest expands symmetrically. Lungs are clear to auscultation, no crackles or wheezes. Heart:  Normal rate and regular rhythm. S1 and S2 normal without gallop, murmur, click, rub or other extra sounds. Abdomen:  Bowel sounds positive,abdomen soft and non-tender without masses, organomegaly or hernias noted. Extremities:  no edema Neurologic:  alert & oriented X3, cranial nerves II-XII intact, and gait normal.   Cervical Nodes:  No lymphadenopathy noted Psych:  Cognition and judgment appear intact. Alert and cooperative with normal attention span and concentration. No apparent delusions, illusions, hallucinations   Impression & Recommendations:  Problem # 1:  FATIGUE (  ICD-780.79)  Orders: UA Dipstick w/o Micro (automated)  (81003) Venipuncture (67893) TLB-BMP (Basic Metabolic Panel-BMET)  (80048-METABOL) TLB-CBC Platelet - w/Differential (85025-CBCD) TLB-Hepatic/Liver Function Pnl (80076-HEPATIC) TLB-TSH (Thyroid Stimulating Hormone) (84443-TSH) TLB-B12, Serum-Total ONLY (81017-P10)  Problem # 2:  ATRIAL FIBRILLATION (ICD-427.31)  His updated medication list for this problem includes:    Aspirin 81 Mg Tbec (Aspirin) ..... One by mouth dialy  Problem # 3:  OBSTRUCTIVE SLEEP APNEA (ICD-327.23)  Complete Medication List: 1)  Simvastatin 40 Mg Tabs (Simvastatin) .Marland Kitchen.. 1 by mouth once daily 2)  Aspirin 81 Mg Tbec (Aspirin) .... One by mouth dialy 3)  Fish Oil Oil (Fish oil) .... Take one tablet by mouth once daily. 4)  Multivitamins Tabs (Multiple vitamin) .Marland Kitchen.. 1 tab once daily 5)  Androgel Pump 1 % Gel (Testosterone) .... 6 pumps daily  Patient Instructions: 1)  it is not clear where this fatigue is coming from. Screen with labs today. He is due to see Dr. Antoine Poche again soon.   Appended Document: weakness arms and legs/lightheaded/low bp/cjr  Laboratory Results   Urine Tests    Routine Urinalysis   Color: yellow Appearance: Clear Glucose: negative   (Normal Range: Negative) Bilirubin: negative   (Normal Range: Negative) Ketone: negative   (Normal Range: Negative) Spec. Gravity: 1.020   (Normal Range: 1.003-1.035) Blood: negative   (Normal Range: Negative) pH: 6.0   (Normal Range: 5.0-8.0) Protein: negative   (Normal Range: Negative) Urobilinogen: 0.2   (Normal Range: 0-1) Nitrite: negative   (Normal Range: Negative) Leukocyte Esterace: negative   (Normal Range: Negative)    Comments: Rita Ohara  May 13, 2010 12:06 PM

## 2011-01-21 NOTE — Letter (Signed)
Summary: Patient Notice- Polyp Results  [facility]  [address] [place], [place] [address]   Phone: [phone]  Fax: [phone]        January 10, 2010 MRN: [id number]    JERE BOSTROM [address] [place], [place]  [address]    Dear Mr. ALIPIO,  I am pleased to inform you that the colon polyp(s) removed during your recent colonoscopy was (were) found to be benign (no cancer detected) upon pathologic examination.  I recommend you have a repeat colonoscopy examination in 5_ years to look for recurrent polyps, as having colon polyps increases your risk for having recurrent polyps or even colon cancer in the future.  Should you develop new or worsening symptoms of abdominal pain, bowel habit changes or bleeding from the rectum or bowels, please schedule an evaluation with either your primary care physician or with me.  Additional information/recommendations:  __ No further action with gastroenterology is needed at this time. Please      follow-up with your primary care physician for your other healthcare      needs.  __ Please call [phone] to schedule a return visit to review your      situation.  __ Please keep your follow-up visit as already scheduled.  _x_ Continue treatment plan as outlined the day of your exam.  Please call us if you are having persistent problems or have questions about your condition that have not been fully answered at this time.  Sincerely,  Louis Meckel MD  This letter has been electronically signed by your physician.  Appended Document: Patient Notice- Polyp Results Letter mailed 1.25.11

## 2011-01-21 NOTE — Letter (Signed)
Summary: Work Writer, Main Office  [address]   [place], [place] [address]   Phone: [phone]  Fax: [phone]         May 27, 2010      [facility]   The above named patient had a medical visit today.  Please take this into consideration when reviewing the time away from work/school.      Sincerely yours,    Sander Nephew, RN for Dr Rollene Rotunda [facility]

## 2011-01-21 NOTE — Miscellaneous (Signed)
Clinical Lists Changes  Observations: Added new observation of SLEEP STUDY: 1. Mild obstructive sleep apnea/hypopnea syndrome with an apnea-       hypopnea index of 7 events per hour and oxygen desaturation as low       as 82%.  Treatment for this degree of sleep apnea can include a       trial of weight loss, upper airway surgery, dental appliance, and       also CPAP.  Clinical correlation is suggested.   2. Premature ventricular contractions noted during the night, but no       clinically significant arrhythmias seen.        (11/23/2009 11:38) Added new observation of ECHOINTERP:   1. Left ventricle: The cavity size was normal. Wall thickness was      normal. Systolic function was mildly reduced. The estimated      ejection fraction was in the range of 45% to 50%. Mild global      hypokinesis. Doppler parameters are consistent with abnormal left      ventricular relaxation (grade 1 diastolic dysfunction).   2. Mitral valve: Status post mitral valve repair. There is an      annuloplasty ring. There appears to be mild to moderate mitral      regurgitation. Doubt severe regurgitation (delayed relaxation      mitral inflow pattern). The findings are consistent with trivial      stenosis. Mean gradient: 5mm Hg (D).   3. Left atrium: The atrium was mildly dilated.   4. Right atrium: The atrium was mildly dilated.   5. Pulmonary arteries: PA peak pressure: 39mm Hg (S).   Impressions:    - Normal LV size with mildly decreased systolic function, EF 45-50%.     There is mild global hypokinesis. The patient is status post     mitral valve repair with an annuloplasty ring noted. There is no     significant mitral stenosis. There is mitral regurgitation     present. It is difficult to quantify due to shadowing from     surgical changes, but it is not severe. It is mild to moderate.     There is mild pulmonary hypertension.  (05/28/2009 11:39)      Sleep Study  Procedure date:   11/23/2009  Findings:      1. Mild obstructive sleep apnea/hypopnea syndrome with an apnea-       hypopnea index of 7 events per hour and oxygen desaturation as low       as 82%.  Treatment for this degree of sleep apnea can include a       trial of weight loss, upper airway surgery, dental appliance, and       also CPAP.  Clinical correlation is suggested.   2. Premature ventricular contractions noted during the night, but no       clinically significant arrhythmias seen.         Echocardiogram  Procedure date:  05/28/2009  Findings:        1. Left ventricle: The cavity size was normal. Wall thickness was      normal. Systolic function was mildly reduced. The estimated      ejection fraction was in the range of 45% to 50%. Mild global      hypokinesis. Doppler parameters are consistent with abnormal left      ventricular relaxation (grade 1 diastolic dysfunction).   2. Mitral valve: Status post mitral valve repair. There is  an      annuloplasty ring. There appears to be mild to moderate mitral      regurgitation. Doubt severe regurgitation (delayed relaxation      mitral inflow pattern). The findings are consistent with trivial      stenosis. Mean gradient: 5mm Hg (D).   3. Left atrium: The atrium was mildly dilated.   4. Right atrium: The atrium was mildly dilated.   5. Pulmonary arteries: PA peak pressure: 39mm Hg (S).   Impressions:    - Normal LV size with mildly decreased systolic function, EF 45-50%.     There is mild global hypokinesis. The patient is status post     mitral valve repair with an annuloplasty ring noted. There is no     significant mitral stenosis. There is mitral regurgitation     present. It is difficult to quantify due to shadowing from     surgical changes, but it is not severe. It is mild to moderate.     There is mild pulmonary hypertension.

## 2011-01-21 NOTE — Assessment & Plan Note (Signed)
Summary: F/U ON LABS // RS   Vital Signs:  Patient profile:   55 year old male Weight:      198 pounds Pulse rate:   72 / minute BP sitting:   110 / 70  (right arm) Cuff size:   regular  Vitals Entered By: Romualdo Bolk, CMA (AAMA) (January 28, 2010 8:38 AM) CC: Follow-up visit on labs    History of Present Illness: Jeremy Wolf  comesin today for  follow up of his low testoterone level.  Still tired but has long days upt to 12 hours  and   decresed libido which he still relates to hernia repair surgery in the past   6-8 hours  of sleep.  Has seen  sleep consult. Using 4 pumps a day of androgel but sometimes  misses because of work. concern about not  transfering to others also .   no Cp sob or se of medications .   Preventive Screening-Counseling & Management  Alcohol-Tobacco     Alcohol drinks/day: 0     Smoking Status: never  Caffeine-Diet-Exercise     Caffeine use/day: 1-2      Does Patient Exercise: no     Type of exercise: cardiac rehab  Current Medications (verified): 1)  Simvastatin 40 Mg Tabs (Simvastatin) .Marland Kitchen.. 1 By Mouth Once Daily 2)  Aspirin 81 Mg Tbec (Aspirin) .... One By Mouth Dialy 3)  Fish Oil   Oil (Fish Oil) .... Take One Tablet By Mouth Once Daily. 4)  Multivitamins   Tabs (Multiple Vitamin) .Marland Kitchen.. 1 Tab Once Daily 5)  Androgel Pump 1 % Gel (Testosterone) .... 4 Pumps Daily  Allergies (verified): No Known Drug Allergies  Past History:  Past Surgical History:  Right miniature thoracotomy for mitral valve repair   (quadrangular resection of posterior leaflet with transposition of   native chordae tendineae x2 and 34-mm Edwards Physio II ring   annuloplasty) and closure of patent foramen ovale.  Inguinal herniorrhaphy   3 years ago.      Left testicular   trauma wrestling.   [facility]   2007   Review of Systems  The patient denies anorexia, fever, weight loss, weight gain, vision loss, hoarseness, chest pain, syncope, peripheral edema,  prolonged cough, headaches, abdominal pain, difficulty walking, unusual weight change, enlarged lymph nodes, angioedema, and testicular masses.    Physical Exam  General:  Well-developed,well-nourished,in no acute distress; alert,appropriate and cooperative throughout examination looks tired  Head:  normocephalic and atraumatic.   Eyes:  vision grossly intact, pupils equal, and pupils round.   Neck:  No deformities, masses, or tenderness noted. Lungs:  Normal respiratory effort, chest expands symmetrically. Lungs are clear to auscultation, no crackles or wheezes. Heart:  Normal rate and regular rhythm. S1 and S2 normal without gallop, murmur, click, rub or other extra sounds. Abdomen:  Bowel sounds positive,abdomen soft and non-tender without masses, organomegaly or   noted. Pulses:  pulses intact without delay   Extremities:  no clubbing cyanosis or edema  Neurologic:  grossly non focal  Skin:  turgor normal and color normal.   Cervical Nodes:  No lymphadenopathy noted Psych:  Oriented X3, good eye contact, not anxious appearing, and subdued.     Impression & Recommendations:  Problem # 1:  TESTICULAR HYPOFUNCTION (ICD-257.2) Assessment Unchanged levels still low   still not applying every day but better   poss  technique     would do better at higher level.   Problem # 2:  LIBIDO, DECREASED (ICD-799.81) see above   with fatigue   sleep is poss inadequate with his job.    will monitor.   Problem # 3:  ERECTILE DYSFUNCTION (ICD-302.72) continuing     pt relates this to his  hernia repair.  not better     Problem # 4:  OBSTRUCTIVE SLEEP APNEA (ICD-327.23)  mild  not felt to be sig enough to intervene except for lifestyle intervention .   Problem # 5:  * MITRAL VALVE REPAIR  Problem # 6:  HYPERSOMNIA (ICD-780.54) Assessment: Comment Only consider   medication  if appropriate if continuing with nl testosterone.   Medications Added to Medication List This Visit: 1)  Androgel  Pump 1 % Gel (Testosterone) .... 6 pumps daily  Complete Medication List: 1)  Simvastatin 40 Mg Tabs (Simvastatin) .Marland Kitchen.. 1 by mouth once daily 2)  Aspirin 81 Mg Tbec (Aspirin) .... One by mouth dialy 3)  Fish Oil Oil (Fish oil) .... Take one tablet by mouth once daily. 4)  Multivitamins Tabs (Multiple vitamin) .Marland Kitchen.. 1 tab once daily 5)  Androgel Pump 1 % Gel (Testosterone) .... 6 pumps daily  Patient Instructions: 1)  increase    androgel to 6 pumps per day.    2)  recheck  testosterone level in  2-3 months. and then rov.   3)  If problem persist after getting a good level then we can refer to another urology group.  4)  Try to get more sleep  7 -8 hours is better than 6 .     5-10 pounds of weight loss may help also.    5)  Before your come back   do a 2 week  sleep diary before visit.  Prescriptions: ANDROGEL PUMP 1 % GEL (TESTOSTERONE) 6 pumps daily  #1 bottle x 3   Entered and Authorized by:   Madelin Headings MD   Signed by:   Madelin Headings MD on 01/28/2010   Method used:   Print then Give to Patient   RxID:   [id number]

## 2011-01-21 NOTE — Assessment & Plan Note (Signed)
Summary: ok per pam/f/u fitague/weakness/pressure in chest/saf   Visit Type:  Follow-up Primary Provider:  Madelin Headings MD  CC:  MR, Fatigue, and Chest Pain.  History of Present Illness: The  patient presents for followup status post mitral valve are clear. He says that for several weeks he has not been feeling well. He's had some chest discomfort. This happened sporadically and is a pressure-like sensation that may last for hours at a time. He's had one severe episode while unloading a truck. He has not ever had this kind of discomfort before. He doesn't seem to radiate to his neck or to his arms. He doesn't describe associated symptoms. He has had no nausea vomiting or diaphoresis. He has been more fatigued. He had episodes of lightheadedness associated with some lower blood pressures and symptoms of orthostasis. Is not describing palpitations and has had no frank syncope. He did have labs drawn recently to include basic metabolic profile CBC and TSH and I reviewed these and they were normal. He had any fevers or chills. He has not had any weight loss, PND or orthopnea. His abdomen change in bowel habits and no rashes.  Current Medications (verified): 1)  Simvastatin 40 Mg Tabs (Simvastatin) .Marland Kitchen.. 1 By Mouth Once Daily 2)  Aspirin 81 Mg Tbec (Aspirin) .... One By Mouth Dialy 3)  Multivitamins   Tabs (Multiple Vitamin) .Marland Kitchen.. 1 Tab Once Daily 4)  Androgel Pump 1 % Gel (Testosterone) .... 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 LAST Td: 4 years ago Colonscopy: n/a Humph[place] saw him   in the past.     hx of Atrial fibrillation, sees Dr. Antoine Poche  Past Surgical History: Right miniature thoracotomy for mitral valve repair   (quadrangular resection of posterior leaflet  with transposition of   native chordae tendineae x2 and 34-mm Edwards Physio II ring   annuloplasty) and closure of patent foramen ovale.  Inguinal herniorrhaphy   3 years ago.      Left testicular   trauma wrestling.   [place]   2007   Review of Systems       As stated in the HPI and negative for all other systems.   Vital Signs:  Patient profile:   55 year old male Height:      70 inches Weight:      198 pounds BMI:     28.51 Pulse rate:   78 / minute Pulse (ortho):   82 / minute Pulse rhythm:   regular Resp:     16 per minute BP sitting:   134 / 68  (right arm) BP standing:   121 / 83  Vitals Entered By: Marrion Coy, CNA (May 27, 2010 10:24 AM)  Serial Vital Signs/Assessments:  Time      Position  BP       Pulse  Resp  Temp     By           Lying RA  121/81   74                    Christy Tuck, CNA           Sitting   124/88   76  Marrion Coy, CNA           Standing  121/83   [address] CNA 2 Min     Standing  124/89   [address], CNA 5  Min    Standing  129/90   82                    [place], CNA  Comments: Lying- No conmplaints Sitting- No complaints Standing- No complaints By: Marrion Coy, CNA  2 Min 2 Min- Dizziness By: Marrion Coy, CNA  5  Min 5 Min- Dizziness By: Marrion Coy, CNA  \  Physical Exam  General:  Well developed, well nourished, in no acute distress. Head:  normocephalic and atraumatic Eyes:  PERRLA/EOM intact; conjunctiva and lids normal. Mouth:  Teeth, gums and palate normal. Oral mucosa normal. Neck:  Neck supple, no JVD. No masses, thyromegaly or abnormal cervical nodes. Chest Wall:  chest well-healed right mini thoracotomy scar Lungs:  Clear bilaterally to auscultation and percussion. Abdomen:  Bowel sounds positive; abdomen soft and non-tender without masses, organomegaly, or hernias noted. No hepatosplenomegaly. Msk:  Back normal, normal gait.  Muscle strength and tone normal. Extremities:  No clubbing or cyanosis. Neurologic:  Alert and oriented x 3. Skin:  Intact without lesions or rashes. Cervical Nodes:  no significant adenopathy Axillary Nodes:  no significant adenopathy Inguinal Nodes:  no significant adenopathy Psych:  Normal affect.   Detailed Cardiovascular Exam  Neck    Carotids: Carotids full and equal bilaterally without bruits.      Neck Veins: Normal, no JVD.    Heart    Inspection: no deformities or lifts noted.      Palpation: normal PMI with no thrills palpable.      Auscultation: regular rate and rhythm, S1, S2 without murmurs, rubs, gallops, or clicks.    Vascular    Abdominal Aorta: no palpable masses, pulsations, or audible bruits.      Femoral Pulses: normal femoral pulses bilaterally.      Pedal Pulses: normal pedal pulses bilaterally.      Radial Pulses: normal radial pulses bilaterally.      Peripheral Circulation: no clubbing, cyanosis, or edema noted with normal capillary refill.     EKG  Procedure date:  05/27/2010  Findings:      Sinus rhythm, rate 78, left axis deviation, no acute ST-T wave changes  Impression & Recommendations:  Problem # 1:  * MITRAL VALVE REPAIR The patient did have some mild mitral regurgitation on followup echo with a slightly reduced ejection fraction. Given the acute complaints I will review this with another echocardiogram. However, I do not strongly suspect a valvular etiology to his complaints. In particular he has no symptoms consistent with endocarditis. Orders: Echocardiogram (Echo)  Problem # 2:  CHEST PAIN (ICD-786.50) He did have some mild nonobstructive coronary disease on catheterization with a 50% diagonal lesion. I will evaluate his chest discomfort with exercise treadmill test.  Problem # 3:  FATIGUE (ICD-780.79) The etiology of his fatigue is not clear. His sleep study and extensive blood work. He is on testosterone replacement therapy. At  this point I will evaluate as above. Orders: Treadmill (Treadmill)  Other Orders: EKG w/ Interpretation (93000)  Patient Instructions: 1)  Your physician recommends that you schedule a follow-up appointment as directed 2)  Your physician recommends that you continue on your current medications as directed. Please refer to the Current Medication list given to you today. 3)  Your physician has requested that you have an echocardiogram.  Echocardiography is a painless test that uses sound waves to create images of your heart. It provides your doctor with information about the size and shape of your heart and how well your heart's chambers and valves are working.  This procedure takes approximately one hour. There are no restrictions for this procedure. 4)  Your physician has requested that you have an exercise tolerance test.  For further information please visit [email or web address]  Please also follow instruction sheet, as given.

## 2011-01-21 NOTE — Letter (Signed)
Summary: [facility]  [facility]   Imported By: Maryln Gottron 10/25/2010 11:17:58  _____________________________________________________________________  External Attachment:    Type:   Image     Comment:   External Document

## 2011-01-21 NOTE — Progress Notes (Signed)
Summary: pt did d/c androgel but will try it again  ---- Converted from flag ---- ---- 04/06/2010 8:41 AM, Madelin Headings MD wrote: make sure he does his follow up lab and appt  by end of MAY  Also tell him if he isnt doing this already to try pump  the androgel directly on this skin and then rub it in ( instad of pumpinf it on to hand) . H e may get better levels that way. ------------------------------    Pt aware of this. He had actually stopped the androgel but will start this back up and apply it to the skin then rub it in. Pt to call back to schedule labs and rov in May. Romualdo Bolk, CMA [facility])  April 16, 2010 2:47 PM   If he has just restarted this then May would be too early .. rec  3 months  (July   )on a given dose before getting the labs done. Madelin Headings MD  April 16, 2010 5:38 PM  Pt aware of this. Romualdo Bolk, CMA (AAMA)  April 17, 2010 1:45 PM

## 2011-01-23 NOTE — Letter (Signed)
Summary: [facility]  [facility]   Imported By: Maryln Gottron 01/16/2011 10:39:05  _____________________________________________________________________  External Attachment:    Type:   Image     Comment:   External Document

## 2011-01-23 NOTE — Letter (Signed)
Summary: [facility]  [facility]   Imported By: Maryln Gottron 01/10/2011 11:09:51  _____________________________________________________________________  External Attachment:    Type:   Image     Comment:   External Document

## 2011-03-03 LAB — BASIC METABOLIC PANEL
BUN: 13 mg/dL (ref 6–23)
Calcium: 9.5 mg/dL (ref 8.4–10.5)
Creatinine, Ser: 0.88 mg/dL (ref 0.4–1.5)
GFR calc non Af Amer: >60 mL/min (ref >60)
Glucose, Bld: 101 mg/dL — ABNORMAL HIGH (ref 70–99)

## 2011-03-03 LAB — CBC
HCT: 45.1 % (ref 39.0–52.0)
MCHC: 33.7 g/dL (ref 30.0–36.0)
Platelets: 215 10*3/uL (ref 150–400)
RDW: 12.4 % (ref 11.5–15.5)

## 2011-03-03 LAB — SURGICAL PCR SCREEN: MRSA, PCR: NEGATIVE

## 2011-04-02 ENCOUNTER — Telehealth: Payer: Self-pay | Admitting: Internal Medicine

## 2011-04-02 MED ORDER — SIMVASTATIN 40 MG PO TABS: 40.0000 mg | ORAL_TABLET | Freq: Every day | ORAL | Status: DC

## 2011-04-02 NOTE — Telephone Encounter (Signed)
Rx sent to pharmacy   

## 2011-04-02 NOTE — Telephone Encounter (Signed)
Pt needs renewal of script for Simvastatin 40 mg 90 day supply, to [facility] order pharmacy [phone]. Pt has sch fasting cpx for 07/09/11 at 10:45am.

## 2011-04-07 LAB — URINALYSIS, ROUTINE W REFLEX MICROSCOPIC
Bilirubin Urine: NEGATIVE
Glucose, UA: NEGATIVE mg/dL
Hgb urine dipstick: NEGATIVE
Ketones, ur: NEGATIVE mg/dL
Nitrite: NEGATIVE
Specific Gravity, Urine: 1.015 (ref 1.005–1.030)
pH: 6.5 (ref 5.0–8.0)

## 2011-04-07 LAB — CBC
Hemoglobin: 14 g/dL (ref 13.0–17.0)
MCHC: 33.5 g/dL (ref 30.0–36.0)
MCV: 90.4 fL (ref 78.0–100.0)
RBC: 4.63 MIL/uL (ref 4.22–5.81)

## 2011-04-07 LAB — TYPE AND SCREEN: Antibody Screen: NEGATIVE

## 2011-04-07 LAB — COMPREHENSIVE METABOLIC PANEL
CO2: 22 mEq/L (ref 19–32)
Calcium: 9.2 mg/dL (ref 8.4–10.5)
Creatinine, Ser: 0.86 mg/dL (ref 0.4–1.5)
GFR calc Af Amer: >60 mL/min (ref >60)
GFR calc non Af Amer: >60 mL/min (ref >60)
Glucose, Bld: 86 mg/dL (ref 70–99)

## 2011-04-07 LAB — DIFFERENTIAL
Lymphocytes Relative: 36 % (ref 12–46)
Lymphs Abs: 1.8 10*3/uL (ref 0.7–4.0)
Neutrophils Relative %: 55 % (ref 43–77)

## 2011-04-07 LAB — BLOOD GAS, ARTERIAL
Acid-Base Excess: 2.1 mmol/L — ABNORMAL HIGH (ref 0.0–2.0)
Drawn by: 206361
O2 Saturation: 95.2 %
Patient temperature: 98.6
pO2, Arterial: 73.4 mmHg — ABNORMAL LOW (ref 80.0–100.0)

## 2011-04-07 LAB — ABO/RH: ABO/RH(D): O POS

## 2011-04-07 LAB — HEMOGLOBIN A1C
Hgb A1c MFr Bld: 5.2 % (ref 4.6–6.1)
Mean Plasma Glucose: 103 mg/dL

## 2011-04-08 LAB — POCT I-STAT 3, ART BLOOD GAS (G3+)
Acid-Base Excess: 3 mmol/L — ABNORMAL HIGH (ref 0.0–2.0)
Acid-base deficit: 1 mmol/L (ref 0.0–2.0)
Acid-base deficit: 2 mmol/L (ref 0.0–2.0)
Bicarbonate: 22 mEq/L (ref 20.0–24.0)
Bicarbonate: 22.6 mEq/L (ref 20.0–24.0)
Bicarbonate: 24.4 mEq/L — ABNORMAL HIGH (ref 20.0–24.0)
Bicarbonate: 25.3 mEq/L — ABNORMAL HIGH (ref 20.0–24.0)
Bicarbonate: 29 mEq/L — ABNORMAL HIGH (ref 20.0–24.0)
O2 Saturation: 100 %
O2 Saturation: 100 %
O2 Saturation: 100 %
Patient temperature: 33.9
TCO2: 23 mmol/L (ref 0–100)
TCO2: 26 mmol/L (ref 0–100)
TCO2: 26 mmol/L (ref 0–100)
TCO2: 26 mmol/L (ref 0–100)
TCO2: 31 mmol/L (ref 0–100)
pCO2 arterial: 37.5 mmHg (ref 35.0–45.0)
pH, Arterial: 7.286 — ABNORMAL LOW (ref 7.350–7.450)
pH, Arterial: 7.337 — ABNORMAL LOW (ref 7.350–7.450)
pH, Arterial: 7.369 (ref 7.350–7.450)
pH, Arterial: 7.461 — ABNORMAL HIGH (ref 7.350–7.450)
pO2, Arterial: 341 mmHg — ABNORMAL HIGH (ref 80.0–100.0)
pO2, Arterial: 67 mmHg — ABNORMAL LOW (ref 80.0–100.0)
pO2, Arterial: 94 mmHg (ref 80.0–100.0)

## 2011-04-08 LAB — CBC
HCT: 28 % — ABNORMAL LOW (ref 39.0–52.0)
HCT: 30.7 % — ABNORMAL LOW (ref 39.0–52.0)
Hemoglobin: 10.3 g/dL — ABNORMAL LOW (ref 13.0–17.0)
Hemoglobin: 10.8 g/dL — ABNORMAL LOW (ref 13.0–17.0)
Hemoglobin: 9.9 g/dL — ABNORMAL LOW (ref 13.0–17.0)
Platelets: 114 10*3/uL — ABNORMAL LOW (ref 150–400)
Platelets: 114 10*3/uL — ABNORMAL LOW (ref 150–400)
Platelets: 131 10*3/uL — ABNORMAL LOW (ref 150–400)
Platelets: 94 10*3/uL — ABNORMAL LOW (ref 150–400)
RBC: 3.42 MIL/uL — ABNORMAL LOW (ref 4.22–5.81)
RDW: 12.7 % (ref 11.5–15.5)
RDW: 12.8 % (ref 11.5–15.5)
RDW: 13.3 % (ref 11.5–15.5)
WBC: 10.3 10*3/uL (ref 4.0–10.5)
WBC: 8.7 10*3/uL (ref 4.0–10.5)
WBC: 8.8 10*3/uL (ref 4.0–10.5)

## 2011-04-08 LAB — BASIC METABOLIC PANEL
BUN: 13 mg/dL (ref 6–23)
BUN: 16 mg/dL (ref 6–23)
Calcium: 7.5 mg/dL — ABNORMAL LOW (ref 8.4–10.5)
Calcium: 8.3 mg/dL — ABNORMAL LOW (ref 8.4–10.5)
Calcium: 8.8 mg/dL (ref 8.4–10.5)
Creatinine, Ser: 1 mg/dL (ref 0.4–1.5)
GFR calc Af Amer: >60 mL/min (ref >60)
GFR calc non Af Amer: >60 mL/min (ref >60)
GFR calc non Af Amer: >60 mL/min (ref >60)
GFR calc non Af Amer: >60 mL/min (ref >60)
GFR calc non Af Amer: >60 mL/min (ref >60)
Glucose, Bld: 156 mg/dL — ABNORMAL HIGH (ref 70–99)
Glucose, Bld: 98 mg/dL (ref 70–99)
Potassium: 3.9 mEq/L (ref 3.5–5.1)
Sodium: 135 mEq/L (ref 135–145)
Sodium: 135 mEq/L (ref 135–145)

## 2011-04-08 LAB — POCT I-STAT 4, (NA,K, GLUC, HGB,HCT)
Glucose, Bld: 110 mg/dL — ABNORMAL HIGH (ref 70–99)
Glucose, Bld: 114 mg/dL — ABNORMAL HIGH (ref 70–99)
Glucose, Bld: 133 mg/dL — ABNORMAL HIGH (ref 70–99)
HCT: 26 % — ABNORMAL LOW (ref 39.0–52.0)
HCT: 26 % — ABNORMAL LOW (ref 39.0–52.0)
HCT: 35 % — ABNORMAL LOW (ref 39.0–52.0)
Hemoglobin: 9.2 g/dL — ABNORMAL LOW (ref 13.0–17.0)
Potassium: 3.6 mEq/L (ref 3.5–5.1)
Potassium: 3.9 mEq/L (ref 3.5–5.1)
Potassium: 4.2 mEq/L (ref 3.5–5.1)
Potassium: 4.3 mEq/L (ref 3.5–5.1)
Potassium: 5.8 mEq/L — ABNORMAL HIGH (ref 3.5–5.1)
Potassium: 5.9 mEq/L — ABNORMAL HIGH (ref 3.5–5.1)
Sodium: 134 mEq/L — ABNORMAL LOW (ref 135–145)
Sodium: 134 mEq/L — ABNORMAL LOW (ref 135–145)
Sodium: 135 mEq/L (ref 135–145)
Sodium: 136 mEq/L (ref 135–145)
Sodium: 140 mEq/L (ref 135–145)
Sodium: 143 mEq/L (ref 135–145)

## 2011-04-08 LAB — PROTIME-INR
INR: 1.2 (ref 0.00–1.49)
INR: 1.3 (ref 0.00–1.49)
Prothrombin Time: 14.4 seconds (ref 11.6–15.2)
Prothrombin Time: 15.1 seconds (ref 11.6–15.2)
Prothrombin Time: 16.3 seconds — ABNORMAL HIGH (ref 11.6–15.2)

## 2011-04-08 LAB — GLUCOSE, CAPILLARY
Glucose-Capillary: 119 mg/dL — ABNORMAL HIGH (ref 70–99)
Glucose-Capillary: 130 mg/dL — ABNORMAL HIGH (ref 70–99)
Glucose-Capillary: 137 mg/dL — ABNORMAL HIGH (ref 70–99)
Glucose-Capillary: 154 mg/dL — ABNORMAL HIGH (ref 70–99)
Glucose-Capillary: 154 mg/dL — ABNORMAL HIGH (ref 70–99)

## 2011-04-08 LAB — POCT I-STAT, CHEM 8
Glucose, Bld: 166 mg/dL — ABNORMAL HIGH (ref 70–99)
HCT: 30 % — ABNORMAL LOW (ref 39.0–52.0)
Hemoglobin: 10.2 g/dL — ABNORMAL LOW (ref 13.0–17.0)
Potassium: 4.5 mEq/L (ref 3.5–5.1)
Sodium: 140 mEq/L (ref 135–145)

## 2011-04-08 LAB — PREPARE FRESH FROZEN PLASMA

## 2011-04-08 LAB — POCT I-STAT 3, VENOUS BLOOD GAS (G3P V)
Acid-base deficit: 2 mmol/L (ref 0.0–2.0)
O2 Saturation: 75 %
pCO2, Ven: 56 mmHg — ABNORMAL HIGH (ref 45.0–50.0)

## 2011-04-08 LAB — CREATININE, SERUM
Creatinine, Ser: 0.83 mg/dL (ref 0.4–1.5)
GFR calc non Af Amer: >60 mL/min (ref >60)

## 2011-04-08 LAB — APTT: aPTT: 31 seconds (ref 24–37)

## 2011-04-08 LAB — MAGNESIUM: Magnesium: 2.3 mg/dL (ref 1.5–2.5)

## 2011-04-08 LAB — PLATELET COUNT: Platelets: 94 10*3/uL — ABNORMAL LOW (ref 150–400)

## 2011-04-08 LAB — POCT I-STAT GLUCOSE: Operator id: 300801

## 2011-04-08 LAB — HEMOGLOBIN AND HEMATOCRIT, BLOOD: HCT: 27.2 % — ABNORMAL LOW (ref 39.0–52.0)

## 2011-05-06 NOTE — Op Note (Signed)
NAME:  Jeremy Wolf, Jeremy Wolf NO.:  [id number]   MEDICAL RECORD NO.:  [id number]          PATIENT TYPE:  INP   LOCATION:  2316                         FACILITY:  [facility]   PHYSICIAN:  Guadalupe Maple, M.D.  DATE OF BIRTH:  1956-10-04   DATE OF PROCEDURE:  01/23/2009  DATE OF DISCHARGE:                               OPERATIVE REPORT   PROCEDURE:  Intraoperative transesophageal echocardiography.   Mr. Jeremy Wolf is a 55 year old white male with a history of severe  mitral regurgitation and normal coronary artery who is scheduled to  undergo mitral valve repair by Dr. Cornelius Moras.  Intraoperative transesophageal  echocardiography was requested to evaluate the mitral apparatus to  assist with the repair and determine the adequacy of repair and to serve  as a monitor for intraoperative volume status.   The patient was brought to the operating room of [facility] and  general anesthesia was induced without difficulty.  Trachea was  intubated without difficulty.  The transesophageal echocardiography  probe was inserted into the esophagus without difficulty.   IMPRESSION:  Prebypass Findings:  1. Aortic valve.  The aortic valve was trileaflet and the leaflets      were thin and pliable and were not calcified and opened normally      and there was no aortic insufficiency.  2. Mitral valve.  There was severe mitral regurgitation.  There was a      flail segment in the posterior leaflet involving the P2 and P3      region.  There were torn chordae and a severe anteriorly-directed      mitral regurgitation jet.  The anterior leaflet was thickened and      appeared myxomatous, but was not prolapsing.  3-D images confirmed      the location of the flail segments to the P2 and a small polyp at      the P3 region.  3. The left ventricle.  The left ventricular function appeared      vigorous.  The left ventricular cavity was dilated and measured 63      mm at end diastole at mid  papillary level.  Left ventricular wall      thickness measured 1.2 cm.  Concentrically, there was good      contractility in all segments interrogated.  Ejection fraction was      calculated at 60%.  There was no thrombus in the left ventricular      apex.  4. Right ventricle.  The right ventricular size was normal with good      contractility of the right ventricular free wall.  5. Tricuspid valve.  The tricuspid valve appeared structurally intact      with no tricuspid insufficiency.  6. Interatrial septum.  There appeared to be no patent foramen ovale      by color Doppler or bubble study.  7. Left atrium.  The left atrial size was enlarged with no thrombus in      the left atrial appendage or left atrial cavity.  8. Ascending aorta.  The ascending aorta appeared normal  with a well-      defined sinotubular ridge and no atheromatous disease of the      ascending aorta noted.  9. Descending aorta.  The descending aorta appeared normal with no      atheromatous disease noted.   Postbypass Findings:  1. Aortic valve.  The aortic valve appeared normal and was unchanged      from the prebypass study.  2. Mitral valve.  The mitral valve showed that there was annuloplasty      ring in place.  The anterior and posterior leaflets were mobile      with restricted mobility of the posterior leaflet.  There was no      mitral insufficiency noted.  This was confirmed by multiple 2-D      views as well as 3-D, and three color views.  3. Left ventricle.  The left ventricular size was decreased.  There      appeared to be good contractility in all segments.  Ejection      fraction estimated at 50-55%.  4. Right ventricle.  The right ventricular size appeared normal with      good contractility of the right ventricular free      wall.  5. Tricuspid valve.  The tricuspid valve was unchanged from the      prebypass study.  It appeared normal with no tricuspid      insufficiency.            ______________________________  Guadalupe Maple, M.D.     DCJ/MEDQ  D:  01/23/2009  T:  01/24/2009  Job:  [id number]

## 2011-05-06 NOTE — Assessment & Plan Note (Signed)
OFFICE VISIT   Jeremy Wolf, Jeremy Wolf  DOB:  [birth year]                                        March 05, 2009  CHART #:  [id number]   The patient returns for further followup, status post right miniature  thoracotomy for mitral valve repair on January 23, 2009.  His  postoperative recovery has been uneventful.  He was last seen here in  the office on February 05, 2009.  Since then, he has continued to do  quite well.  The patient reports that he has very mild residual soreness  associated with his minithoracotomy.  This is no longer requiring any  sort of pain reliever.  He has some mild numbness extending anteriorly  with occasional paresthesias, typically for mild intercostal neurapraxia  which is resolving.  He has no shortness of breath.  His activity level  is good.  He reports that a couple of weeks ago, he had a slight amount  of drainage from his right groin incision, but this resolved.  He has  not had any fevers or chills.  He has no other complaints and overall he  feels well.   PHYSICAL EXAMINATION:  GENERAL:  A well-appearing male.  VITAL SIGNS:  Blood pressure 119/80, pulse 100, and oxygen saturation  98% on room air.  CHEST:  A miniature thoracotomy incision that has healed nicely.  LUNGS:  Breath sounds are clear to auscultation and symmetrical  bilaterally.  CARDIOVASCULAR:  Regular rate and rhythm.  No murmurs, rubs, or gallops  are appreciated.  ABDOMEN:  Soft and nontender.  The right groin incision has healed  nicely.  There is no surrounding cellulitis, palpable fluid collection,  or fluctuation.  EXTREMITIES:  Warm and well perfused.  There is no lower extremity  edema.   IMPRESSION:  The patient continues to do quite well.   PLAN:  I have encouraged the patient to continue to increase his  physical activity as tolerated.  He has no specific physical limitations  at this point in time, other than the fact that if he still has some  mild soreness, he should avoid any heavy lifting or strenuous use of his  arms that might exacerbate soreness as this continues to slowly resolve.  All of his questions have been addressed.  We will plan to see him back  in 4 months for routine followup.  At some point, late  followup, repeat echocardiogram might be appropriate, and I suspect this  may have already been arranged through Dr. Jenene Slicker office.   Jeremy Wolf, M.D.  Electronically Signed   CHO/MEDQ  D:  03/05/2009  T:  03/06/2009  Job:  [id number]   cc:   Rollene Rotunda, MD, [facility]  Neta Mends. Fabian Sharp, MD

## 2011-05-06 NOTE — Assessment & Plan Note (Signed)
OFFICE VISIT   LINDBERGH, WINKLES  DOB:  01-26-1956                                        July 18, 2009  CHART #:  [id number]   HISTORY OF PRESENT ILLNESS:  The patient returns for followup status  post right miniature thoracotomy for mitral valve repair on January 23, 2009.  He was last seen here in the office on March 05, 2009, at which  time he was doing well.  Since then, he has continued to do well and he  is back at work on a part-time basis.  He underwent a followup  echocardiogram on May 28, 2009, at the [facility].  He  has not had any further clinical problems and he is scheduled to go back  to see Dr. Antoine Poche for followup sometime next year.  The patient  reports that since he finished the cardiac rehab program, he is no  longer exercising on a regular basis.  He cannot comment on his exercise  tolerance.  He still has some vague occasional spells of weakness and  feeling weak in his legs, particularly in the extreme hot weather.  He  thinks that he is bothered by the heat more than he had been in the  past.  He has not had any shortness of breath.  He has not had any tachy  palpitations.  He reports occasional atypical chest pain that seems to  be related to spicy foods, although he also reports occasional spells  where he feels tenderness along the left costal margin.  He otherwise  seems to be doing quite well and he is back at work and has no other  problems or complaints.  He has not had any fevers or chills.   PHYSICAL EXAMINATION:  General:  Well-appearing male.  Vital Signs:  Blood pressure 106/64, pulse 90 and regular, oxygen saturation 97% on  room air.  Chest: Well-healed surgical scar from his mini thoracotomy.  Breath sounds are clear to auscultation and symmetrical bilaterally.  Cardiovascular:  Regular rate and rhythm.  There is a soft systolic  murmur heard best between the left lower sternal border and the apex.  The murmur is not heard at the apex, and it cannot be appreciated in the  axilla.  Otherwise, no murmurs are appreciated.  Abdomen:  Soft,  nontender.  Extremities:  Warm and well perfused.  Distal pulses are  palpable.   DIAGNOSTICS TEST:  A 2-D echocardiogram performed May 28, 2009, at  [facility] is reviewed.  This demonstrates normal left  ventricular size and slightly reduced systolic function.  Ejection  fraction is probably 50%.  There is mitral annuloplasty ring in place  and the mitral valve appears to be functioning normally.  I do not see  much of any mitral regurgitation.  At most, there is mild mitral  regurgitation.  There is no sign of mitral stenosis.  Annuloplasty ring  was fairly large.  The valve is opening well.  The aortic valve was  functioning normally.  No other abnormalities are noted.   IMPRESSION:  The patient seems to be doing well now nearly 6 months  status post mitral valve repair.   PLAN:  In the future, the patient will call or return to see [place] here at  [facility] only  should further problems or  difficulties arise.  I have encouraged him to get back into a regular  exercise program.  All of his questions have been addressed.   [name], M.D.  Electronically Signed   CHO/MEDQ  D:  07/18/2009  T:  07/19/2009  Job:  [id number]   cc:   Rollene Rotunda, MD, [facility]  Neta Mends. Fabian Sharp, MD

## 2011-05-06 NOTE — H&P (Signed)
HISTORY AND PHYSICAL EXAMINATION   January 22, 2009   Re:  Jeremy Wolf, Jeremy Wolf         DOB:  25-Sep-1956   Date of hospital admission January 23, 2009.   HISTORY OF PRESENT ILLNESS:  The patient returns to the office today  with tentative plans to proceed with elective mitral valve repair  tomorrow.  He was originally seen in consultation on December 25, 2008.  A  full consultation note and history and physical exam was dictated at  that time.  Since then, the patient reports no new problems or  complaints.  He has occasional atypical symptoms of chest pain.  He has  mild exertional shortness of breath.  He otherwise feels well.  The  remainder of his review of systems is unchanged from previously.   Past medical history, past surgical history, family history, and social  history all remain unchanged.   CURRENT MEDICATIONS:  1. Aspirin 81 mg daily.  2. Simvastatin 20 mg daily.  3. Amiodarone 400 mg p.o. twice daily.   DRUG ALLERGIES:  None known.   PHYSICAL EXAMINATION:  GENERAL:  The patient is a well-appearing male  who appears of stated age, in no acute distress.  VITAL SIGNS:  Blood pressure 133/80, pulse 70 and regular, and oxygen  saturation 97% on room air.  HEENT:  Unrevealing.  NECK:  Supple.  There is no cervical or supraclavicular lymphadenopathy.  CHEST:  Auscultation of the chest demonstrates clear breath sounds,  which are symmetrical bilaterally.  No wheezes or rhonchi are noted.  CARDIOVASCULAR:  Notable for regular rate and rhythm.  There is a  prominent grade 4/6 holosystolic murmur heard all across the precordium  with radiation to the axilla and back.  No diastolic murmurs are noted.  ABDOMEN:  Soft, nontender.  Bowel sounds are present.  EXTREMITIES:  Warm and well perfused.  Pulses are palpable.  There is no  lower extremity edema.  RECTAL/GU:  Both deferred.  NEUROLOGIC:  Grossly nonfocal.   IMPRESSION:  Mitral valve prolapse with  severe mitral regurgitation.   PLAN:  We will plan to proceed with elective mitral valve repair through  right miniature thoracotomy in the operating room tomorrow.  The patient  and his wife understand and accept all potential associated risks of  surgery and desire to proceed as described.  All their questions have  been addressed.   [name], M.D.  Electronically Signed   CHO/MEDQ  D:  01/22/2009  T:  01/22/2009  Job:  [id number]

## 2011-05-06 NOTE — Op Note (Signed)
NAME:  Jeremy Wolf, Jeremy Wolf NO.:  [id number]   MEDICAL RECORD NO.:  [id number]          PATIENT TYPE:  INP   LOCATION:  2316                         FACILITY:  [facility]   PHYSICIAN:  [name], M.D. DATE OF BIRTH:  Apr 30, 1956   DATE OF PROCEDURE:  01/23/2009  DATE OF DISCHARGE:                               OPERATIVE REPORT   PREOPERATIVE DIAGNOSIS:  Mitral valve prolapse with severe mitral  regurgitation.   POSTOPERATIVE DIAGNOSIS:  Mitral valve prolapse with severe mitral  regurgitation and small patent foramen ovale.   PROCEDURE:  Right miniature thoracotomy for mitral valve repair  (quadrangular resection of posterior leaflet with transposition of  native chordae tendineae x2 and 34-mm Edwards Physio II ring  annuloplasty) and closure of patent foramen ovale.   SURGEON:  [name], MD   ASSISTANT:  Rowe Clack, PA-C   ANESTHESIA:  General.   BRIEF CLINICAL NOTE:  The patient is a 55 year old male who was found to  have heart murmur on routine physical exam.  He subsequently underwent  echocardiogram demonstrating mitral valve prolapse with severe mitral  regurgitation.  Transesophageal echocardiogram confirmed the presence of  mitral valve prolapse with a flail segment of the posterior leaflet of  the mitral valve and severe (4+) mitral regurgitation.  There was normal  left ventricular function.  Cardiac catheterization demonstrates no  significant coronary artery disease with normal coronary artery anatomy.  A full consultation has been dictated previously.  The patient has been  counseled at length regarding the indications, risks, and potential  benefits of surgery.  Alternative treatment strategies have been  discussed.  He understands and accepts all potential associated risks  and desires to proceed with surgery as described.   OPERATIVE FINDINGS:  1. Normal left ventricular size and function.  2. Barlow's-type degenerative mitral  valve prolapse with flail segment      of the posterior leaflet (P2).  3. Severe mitral regurgitation.  4. Small patent foramen ovale.  5. No residual mitral regurgitation following successful mitral valve      repair.   OPERATIVE NOTE IN DETAIL:  The patient was brought to the operating room  on the above-mentioned date.  A central monitoring was established by  the anesthesia team under the care and direction of Dr. Kipp Brood.  Specifically, a Swan-Ganz catheter was placed through the left internal  jugular approach.  A radial arterial line was placed.  Intravenous  antibiotics were administered.  Following induction with general  endotracheal anesthesia, a Foley catheter was placed.  Initially, the  patient was indicated using a dual-lumen endotracheal tube.  The patient  was placed in the supine position on the operating table with a soft  roll behind the right scapula to bump the right side somewhat.  The neck  was turned towards the left and gently extended.  The patient's right  neck, chest, abdomen, both groins, and both lower extremities were  prepared and draped in a sterile manner.   Baseline transesophageal echocardiogram was performed by Dr. Noreene Larsson.  This confirms the presence of mitral valve prolapse  with severe mitral  regurgitation.  The mitral valve was large with billowing leaflets and a  fairly tall posterior leaflet which was flail in the middle scallop  (P2).  There were numerous ruptured primary chords to the middle scallop  of the posterior leaflet.  The jet of mitral regurgitation extends  eccentrically anteriorly around the left atrium and was broad and filled  the entire left atrium.  There was normal left ventricular size and  function.  No other significant abnormalities were noted.   A small incision was made in the right inguinal crease.  Through this  incision, the anterior surface of the right common femoral artery and  the right common femoral vein  were dissected away from the associated  structures.  The right common femoral artery was soft and normal in  appearance.   Single-lung ventilation was begun.  A right miniature anterolateral  thoracotomy incision was made just lateral to the right nipple.  The  right pleural space was entered through the fourth intercostal space.  Exposure was felt to be excellent.  Two 11-mm thoracoscopic ports were  placed through separate stab incisions inferiorly.  Ultimately, these  incisions would be utilized for chest tube placement at the completion  of the operation.  Carbon dioxide gas was insufflated through one of  these ports to insufflate the right chest throughout the duration of the  remainder of the procedure.  A soft tissue retractor was placed through  the small incision, and a small retractor was gently placed with care to  avoid significant rib spreading.  A longitudinal incision was made in  the pericardium 2 cm anterior to the phrenic nerve.  Silk traction  sutures were placed in the pericardium and exited posteriorly and  anteriorly to facilitate exposure.   The patient was heparinized systemically.  Two concentric purse-string  sutures were placed on the anterior surface of the right common femoral  artery.  A single concentric purse-string suture was placed on the  anterior surface of the right common femoral vein.  The femoral vein was  now cannulated using a Seldinger technique through the purse-string  suture, and a long flexible guidewire was advanced up through the vein  through the inferior vena cava, through the right atrium into the  superior vena cava using transesophageal echocardiogram for guidance.  The vein was then dilated with serial dilators and the 23 x 25 French  long femoral venous cannula was then advanced over the guidewire through  the inferior vena cava up through the right atrium until the tip extends  into the superior vena cava, again using  transesophageal echocardiogram  for guidance.  The right common femoral artery was now cannulated with  the Seldinger technique through the purse-string sutures, the artery was  subsequently dilated and an 18-French femoral arterial cannula was  advanced uneventfully.  A small stab incision was made now made low in  the right neck.  The right internal jugular vein was cannulated with  Seldinger technique and flexible guidewire was advanced into the  superior vena cava and right atrium using transesophageal cardiogram for  guidance.  The internal jugular vein was then dilated with serial  dilators, and a 16-French femoral cannula was advanced into the internal  jugular vein to serve as bicaval venous drainage.   Cardiopulmonary pass was begun.  Vacuum-assist venous drainage was  utilized.  Drainage was notably excellent and exposure was excellent.  The incision in the pericardium was extended in both directions.  The  ascending aorta was normal in appearance.  Two concentric purse-string  sutures were placed in the right anterolateral surface of the ascending  aorta for placement of the antegrade cardioplegic cannula.  This cannula  was placed uneventfully and snared with keepers.  A purse-string suture  was placed in the surface of the right atrium.  A retrograde  cardioplegic cannula was then placed through the right atrium into the  coronary sinus using transesophageal echocardiogram for guidance.  The  patient was cooled to 28 degrees systemic temperature.  The aortic  crossclamp was applied and cold blood cardioplegia was delivered  initially in an antegrade fashion through the aortic root.  The initial  cardioplegic arrest was rapid.  Supplemental cardioplegia was  administered retrograde through the coronary sinus catheter.  Repeat  doses of cardioplegia were administered intermittently throughout the  crossclamp portion of the operation every 20 minutes or more frequently  both  antegrade through the aortic root and retrograde through the  coronary sinus catheter to maintain completely isoelectric  electrocardiogram.   A left atriotomy incision was performed posteriorly through the  interatrial groove.  The mitral valve was exposed using a retractor  blade, attached to a separate side arm, placed through a small stab  incision just to the right side of the sternum through the fourth  intercostal space.  Exposure was felt to be excellent.  The mitral valve  was carefully examined.  The mitral valve was large with billowing  leaflets and redundant leaflet tissue.  There was a flail segment of the  middle scallop (P2) of the posterior leaflet of the mitral valve.  There  were numerous ruptured primary chords to this leaflet.  There was no  significant anterior leaflet pathology.  The remainder of the  subvalvular apparatus appeared reasonably normal, although there was  some mild calcification and foreshortening of secondary chords on the  undersurface of the posterior leaflet in the P2-P3 region.   Interrupted 2-0 Ethibond horizontal mattress sutures were placed  circumferentially around the mitral annulus.  These sutures ultimately  were to be utilized for ring annuloplasty, and at this juncture they  facilitated suspension of the valve for further analysis and repair.  A  quadrangular resection of the flail segment of the middle scallop (P2)  of the posterior leaflet was then performed.  Once this has been  accomplished, the somewhat foreshortened and calcified tertiary chords  on the underside of the posterior leaflet were divided to free up this  area of the leaflet.  Two secondary chords from the undersurface of  posterior leaflet that were nearby appeared to be normal length and were  preserved.  These were transected from the underside surface of the  posterior leaflet and subsequently transposed to the free margin of the  posterior leaflet alongside the  quadrangular resection.  The vertical  defect in the posterior leaflet was closed initially with a single 2-0  Ethibond compression suture placed in the posterior annulus.  The  remaining vertical defect was then closed with interrupted CV-5 Gore-Tex  sutures.  The transposed secondary chords were then fixed to the free  margin of the posterior leaflet with CV-5 Gore-Tex sutures to provide  extra support in the midportion of the repair.  At this juncture, the  valve was tested with saline and appears to be competent even without  annuloplasty completed.   The valve was sized to accept a 34-mm annuloplasty ring.  This size  corresponded fairly precisely  to the dimensions of the anterior leaflet.  Care was taken not to underside this ring.  An Edwards Physio II  annuloplasty ring (model number 5200, serial number [id number]) was secured  in place uneventfully.  The valve was tested with saline and appeared to  be perfectly competent.  There was a broad surface area of coaptation of  the anterior posterior leaflets which was symmetrical.  There was no  residual mitral regurgitation.  Rewarming was begun.   A sump drain was placed across the mitral valve to serve as a left  ventricular vent.  A bipolar epicardial pacing wires were fixed to the  undersurface of the left ventricle.  A small patent foramen ovale was  noted, and this was closed with a single figure of eight suture.  The  left atriotomy incision was now closed using a 2-layer closure of  running 3-0 Prolene suture.  The lungs were ventilated and heart allowed  to fill briefly.  One final dose of warm hot-shot cardioplegia was  administered.  The aortic crossclamp was removed after a total  crossclamp time of 137 minutes.   The heart began to beat spontaneously without need for cardioversion.  The left ventricular vent was removed and the atriotomy closure was  completed.  The atrial pacing wires were fixed to the right atrial   appendage after removal of the retrograde cardioplegic cannula.  The  patient was rewarmed to 37 degrees centigrade temperature.  The aortic  cardioplegic cannula was removed and cannulation site was carefully  inspected for meticulous hemostasis.   The patient weaned from cardiopulmonary bypass without difficulty.  The  patient's rhythm at separation from bypass was sinus bradycardia.  Atrial pacing was employed to increase heart rate.  No inotropic support  was required.  Total cardiopulmonary bypass at the time of the operation  was 185 minutes.   Followup transesophageal echocardiogram performed by Dr. Noreene Larsson after  separation from bypass demonstrated well-seated annuloplasty ring in the  mitral position with normal functioning mitral valve.  There was no  residual mitral regurgitation.  There was no sign of any systolic  anterior motion of the mitral valve.  There was no residual air.  No  other abnormalities were noted.   Protamine was administered to reverse the anticoagulation.  The femoral  venous cannula was removed and the purse-string suture was secured.  The  femoral arterial cannula was removed and its purse-string suture was  secured.  There was a palpable pulse in the distal right common femoral  artery.  The internal jugular cannula was removed and the cannulation  site was controlled with a single mattress-pledgeted sutures through and  through the skin.   Single-lung ventilation was begun.  The right chest was inspected for  meticulous hemostasis.  The pericardial space was drained with a single  28-French Bard drain exited through one of the inferior port incisions.  The pericardium was reapproximated laterally.  The On-Q continuous pain  management system was utilized to facilitate postoperative pain control.  One 5-inch catheter supplied with the On-Q kit was tunneled initially  through the subcutaneous tissues to the posterior port incision and then  passed  through the intercostal space and tunneled into the subpleural  space posteriorly to cover the second through the sixth intercostal  spaces.  The catheter was flushed with 5 mL of 0.5% bupivacaine solution  and ultimately connected to a continuous infusion pump.  The right  pleural space drained with a 28-French Bard  drain exited through the posterior port incision.  The miniature thoracotomy incision was closed  in multiple layers in a routine fashion.  The skin incision was closed  with subcuticular skin closure.  The right groin incision was inspected  for hemostasis and subsequently closed in multiple layers in routine  fashion.  The skin incision was closed with subcuticular skin closure.  Chest tubes were fixed to closed suction drainage device.   The patient tolerated the procedure well.  The patient was reintubated  using a single-lumen endotracheal tube and subsequently transported to  the Surgical Intensive Care Unit in stable condition.  There were no  intraoperative complications.  All sponge, instrument, and needle counts  were verified as correct at the completion of the operation.      [name], M.D.  Electronically Signed     CHO/MEDQ  D:  01/23/2009  T:  01/24/2009  Job:  [id number]   cc:   Rollene Rotunda, MD, [facility]  Peter C. Eden Emms, MD, [facility]  Neta Mends. Fabian Sharp, MD

## 2011-05-06 NOTE — Cardiovascular Report (Signed)
NAME:  Jeremy Wolf, Jeremy Wolf NO.:  [id number]   MEDICAL RECORD NO.:  [id number]          PATIENT TYPE:  OIB   LOCATION:  1963                         FACILITY:  [facility]   PHYSICIAN:  Rollene Rotunda, MD, FACCDATE OF BIRTH:  01/05/1956   DATE OF PROCEDURE:  12/19/2008  DATE OF DISCHARGE:  12/19/2008                            CARDIAC CATHETERIZATION   PRIMARY CARE PHYSICIAN:  Neta Mends. Panosh, MD   CARDIOLOGIST:  Rollene Rotunda, MD, [facility]   PROCEDURE:  Right left and heart catheterization/coronary arteriography.  I also did a distal aortogram.   INDICATIONS:  Severe mitral regurgitation requiring mitral valve repair.   PROCEDURE NOTE:  Left and right heart catheterization performed via the  left femoral artery.  The vessels were cannulated using the anterior  wall puncture.  A #4-French arterial sheath and a #7-French venous  sheath were inserted via the modified Seldinger technique.  Preformed  Judkins, pigtail, and a Swan-Ganz catheter were utilized.  The patient  tolerated the procedure well and left the lab in stable condition.   RESULTS:  Hemodynamics:  RA mean 7, RV 31/8, PA 37/15 with a mean of 26,  pulmonary capillary wedge pressure mean 16 with a large V-wave, LV  101/13, and AO 101/70.  Cardiac output/cardiac index (Fick) 4.2/2.1.  Coronaries:  Left main was normal.  The LAD had mild proximal luminal  irregularities.  First diagonal was moderate size with ostial 50%  stenosis.  Circumflex in the AV groove was normal.  There was mid obtuse  marginal which was large and normal.  Posterolateral was small and  normal.  The right coronary artery was dominant vessel and normal  throughout its course.  PDA was moderate size and normal.  Left  ventriculogram:  The left ventriculogram was obtained in the RAO  projection.  The EF was 65% with mild inferior hypokinesis and severe  mitral regurgitation.  Aortogram:  A distal aortogram was obtained at  the request of Dr.  Cornelius Moras.  He will be cannulating the right groin for  the heart and lung bypass.  The aortogram including the bifurcation and  iliac vessels/femorals were free of any plaque.   CONCLUSIONS:  Severe mitral regurgitation.  Mild coronary plaquing.   PLAN:  The patient will be referred back to Dr. Cornelius Moras for consideration  of mitral valve repair.      Rollene Rotunda, MD, [facility]  Electronically Signed     JH/MEDQ  D:  12/19/2008  T:  12/20/2008  Job:  [id number]   cc:   Neta Mends. Fabian Sharp, MD  Salvatore Decent Cornelius Moras, M.D.

## 2011-05-06 NOTE — Assessment & Plan Note (Signed)
OFFICE VISIT   Jeremy Wolf, Jeremy Wolf  DOB:  [birth year]                                        February 05, 2009  CHART #:  [id number]   HISTORY OF PRESENT ILLNESS:  The patient returns for routine followup  now approximately 2 weeks following right miniature thoracotomy for  mitral valve repair and closure of patent foramen ovale.  The patient's  early postoperative recovery was notable for some transient  postoperative atrial fibrillation, which resolved while he remained in  the hospital.  He was discharged home in normal sinus rhythm on the  fourth postoperative day.  Following hospital discharge, she has  continued to recover well.  He was seen in the office last week by Dr.  Dorris Fetch due to concerns regarding the small incision in his right  lower neck.  He was noted to have some mild erythema around this  incision.  This was treated with topical antibiotics (Polysporin  ointment) and the redness has resolved.  The patient has otherwise  continued to do quite well.  He has had his prothrombin time checked and  monitored through the [facility].  He returned to our  office today.  He reports that he has only mild residual soreness in his  chest.  He has stopped taking all pain medicines.  He has no shortness  of breath.  His activity level is quite good.  His appetite is good.  He  feels as though he can start pushing his physical activity.  He has no  complaints.   PHYSICAL EXAMINATION:  GENERAL:  Notable for a well-appearing male.  VITAL SIGNS:  Blood pressure 117/81.  Pulse is 96 and regular.  A 2-  channel telemetry rhythm strip demonstrates what appears to be normal  sinus rhythm.  Oxygen saturation is 98% on room air.  CHEST:  A miniature thoracotomy incision that is healing nicely.  Chest  tube sutures are removed.  Breath sounds are clear to auscultation and  symmetrical bilaterally.  CARDIOVASCULAR:  Notable for regular rate and  rhythm.  No murmurs, rubs,  or gallops are noted.  ABDOMEN:  Soft and nontender.  The right groin incision is healing well.  EXTREMITIES:  There is no lower extremity edema.  Distal pulses are  palpable.   DIAGNOSTIC TEST:  Chest x-ray obtained today at the [facility] is reviewed.  This demonstrates clear lung fields with improved  bibasilar atelectasis.  There is a trace right pleural effusion,  decreased from previously.  No other abnormalities are noted.   IMPRESSION:  The patient appears to be recovering quite nicely.   PLAN:  I have instructed the patient to cut his dose of amiodarone back  to 200 mg daily.  He will continue to take 200 mg daily until his  current prescription runs out.  I do think he should resume this unless  he has evidence for recurrent atrial fibrillation.  Assuming that he  remains in normal sinus rhythm, I would plan on stopping Coumadin within  2-3 months.  I think, he can resume driving an automobile.  I have also  encouraged him start increasing his physical activity as tolerated.  His  only limitation at this point is that he should avoid any heavy lifting  with his arms or shoulders for at least  another couple of weeks.  All of  his questions have been addressed.  We will plan to see him back for  further followup in 4 weeks.   [name], M.D.  Electronically Signed   CHO/MEDQ  D:  02/05/2009  T:  02/05/2009  Job:  [id number]   cc:   Rollene Rotunda, MD, [facility]  Neta Mends. Fabian Sharp, MD

## 2011-05-06 NOTE — H&P (Signed)
HISTORY AND PHYSICAL EXAMINATION   December 25, 2008   Re:  Jeremy Wolf, Jeremy Wolf         DOB:  1956-07-24   Date of planned hospital admission January 23, 2009   PRESENTING CHIEF COMPLAINT:  Leaking mitral valve.   HISTORY OF PRESENT ILLNESS:  The patient is a 55 year old male from  [place] who has been fairly healthy most of his life.  Many years  ago, he was told that he had an abnormal heart sound and was instructed  to take antibiotics prior to any dental work for dental procedure.  However, no formal evaluation was entertained.  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  He was noted to have a prominent heart  murmur on exam.  Subsequent to this, the patient started seeing Dr.  Berniece Andreas at [facility].  She noted prominent murmur  consistent with mitral regurgitation and ordered an echocardiogram and  referred him for formal cardiac evaluation.  The patient underwent  echocardiogram on December 01, 2008, and was seen by Dr. Rollene Rotunda.  He was found to have mitral valve prolapse with severe mitral  regurgitation.  He subsequently underwent transesophageal echocardiogram  and left and right heart catheterization.  Transesophageal  echocardiogram confirmed the presence of mitral valve prolapse with  severe (4+) mitral regurgitation.  There is normal left ventricular size  and function with significant left atrial enlargement.  Cardiac  catheterization revealed normal coronary artery anatomy with no  significant coronary artery disease.  There is mild pulmonary  hypertension with large V-wave on pulmonary capillary wedge pressure.  The patient has now been referred for possible elective mitral valve  repair.   REVIEW OF SYSTEMS:  General:  The patient reports normal appetite.  He  has not been gaining nor losing weight recently.  He does report some  exertional  fatigue.  Cardiac:  The patient essentially denies any  symptoms of congestive heart failure.  He does report that occasionally  he feels some tightness and shortness of breath with strenuous activity,  particularly during cold weather.  However, this has been very mild and  has not limited his physical activity in anyway whatsoever.  He  otherwise denies any chest pain, chest tightness, chest pressure either  with activity or at rest.  He denies any problems with shortness of  breath.  He has not had PND, orthopnea, nor lower extremity edema.  He  does report some tachypalpitations.  He has never had any dizzy spells.  Respiratory:  Negative.  The patient denies productive cough,  hemoptysis, and wheezing.  Gastrointestinal:  Notable for some symptoms  of reflux in the past, but this resolved without intervention.  He has  no difficulty swallowing.  He denies any hematochezia, hematemesis, and  melena.  His bowel function is regular.  Musculoskeletal:  Negative.  The patient denies significant arthritis or arthralgias.  Neurologic:  Negative.  The patient denies transient monocular blindness, transient  numbness, or weakness involving either upper or lower extremity.  Genitourinary:  Notable for some erectile dysfunction.  The patient has  no difficulty urinating.  He denies urgency or frequency.  Infectious:  Negative.  HEENT:  Negative.  The patient sees his dentist twice every  year for routine dental exams and gets treated with antibiotics prior to  all dental work.  Psychiatric:  Negative.   PAST MEDICAL HISTORY:  Hyperlipidemia.   PAST SURGICAL  HISTORY:  Right inguinal hernia repair with mesh  approximately 4 years ago.   FAMILY HISTORY:  Noncontributory.   SOCIAL HISTORY:  The patient is married, lives with his wife here in  [place].  He currently works full-time as a Naval architect.  This  requires fairly strenuous physical activity.  He is a nonsmoker.  He  denies  significant alcohol consumption.   CURRENT MEDICATIONS:  1. Simvastatin 20 mg daily  2. Enteric-coated aspirin 81 mg daily.   DRUG ALLERGIES:  None known.   PHYSICAL EXAMINATION:  GENERAL:  The patient is a well-appearing male  who appears of stated age, in no acute distress.  VITAL SIGNS:  Pulse 66 and regular, oxygen saturation 96% on room air,  and blood pressure 133/78.  HEENT:  Unrevealing.  NECK:  Supple.  There are no carotid bruits.  There is no palpable  lymphadenopathy.  CHEST:  Auscultation of the chest demonstrates clear breath sounds,  which are symmetrical bilaterally.  No wheezes nor rhonchi are noted.  CARDIOVASCULAR:  Regular rate and rhythm.  There is a prominent grade  4/6 holosystolic murmur heard best at the apex with radiation all across  the precordium to the axilla into the back.  No diastolic murmurs are  noted.  ABDOMEN:  Soft, nondistended, nontender.  The liver edge is not  palpable.  Bowel sounds are present.  EXTREMITIES:  Warm and well perfused.  There is no lower extremity  edema.  There is ecchymosis on the left groin from recent  catheterization.  There are palpable femoral pulses.  There are palpable  posterior tibial pulses bilaterally.  There is no significant venous  insufficiency.  RECTAL/GU:  Both deferred.  NEUROLOGIC:  Grossly nonfocal and symmetrical throughout.   DIAGNOSTIC TESTS:  Transesophageal echocardiogram performed on December 11, 2008, by Dr. Charlton Haws is reviewed.  This demonstrates mitral  valve prolapse with severe (4+) mitral regurgitation.  Specifically, the  valve itself is somewhat large with slightly thickened anterior and  posterior leaflets.  There is a very long middle scallop of the  posterior leaflet (P2, which is flail with multiple ruptured primary  cords).  The anterior leaflet does not appear to be prolapsing.  The  remainder of the subvalvular apparatus appears essentially normal.  Left  ventricular size  and function appears normal.  There is a broad jet of  mitral regurgitation standing eccentrically around the anterior portion  of the left atrium.  There does not appear to be any aortic  insufficiency.  The aortic valve is tricuspid.  Left atrium is enlarged.  No other significant abnormalities are noted.   Left and right heart catheterization performed by Dr. Antoine Poche on  December 19, 2008, is reviewed.  This demonstrates normal coronary  artery anatomy with right dominant coronary circulation and no  significant coronary artery disease.  Left ventricular function appears  normal, although there is severe mitral regurgitation.  PA pressures  measured 37/15 with a pulmonary capillary wedge pressure of 16 and a  large V-wave.  Cardiac output was normal.  Central venous pressure was  7.  Aortogram was normal with no significant aortoiliac disease.   IMPRESSION:  Mitral valve prolapse with flail posterior leaflet and  severe (4+) mitral regurgitation.  I believe that the patient would be  an excellent candidate for elective mitral valve repair using minimally  invasive technique.   PLAN:  I have discussed matters at length with the patient and his wife  here  in the office today.  Long-term prognosis with medical therapy has  been reviewed.  The reasons to proceed with surgery have been discussed.  All potential associated risks of surgery have been reviewed.  The  relative risks and benefits of minimally invasive approach versus a  conventional sternotomy has been discussed at length.  I believe, the  patient's valve will likely be repairable.  However, the small  possibility that valve replacement would be necessary was discussed and  under the circumstances, we would replace his valve using a mechanical  prosthesis.  The patient and his wife both understand and accept all  potential associated risks of surgery including but not limited to risk  of death, stroke, myocardial infarction,  congestive heart failure,  respiratory failure, pneumonia, bleeding requiring blood transfusion,  arrhythmia, heart block or bradycardia requiring permanent pacemaker,  late complications related to valve repair, and possible need of valve  replacement.  All of their questions have been addressed.  The patient  desires to wait until January 22, 2009, to proceed with surgery.  We  will tentatively plan to proceed with surgery on Tuesday January 23, 2009.  He has been given a prescription for amiodarone 400 mg p.o. twice  daily to begin 1 week prior to surgery on Tuesday January 16, 2009.  We  will see him in the office for final consultation and followup the day  before surgery on Monday January 22, 2009.   [name], M.D.  Electronically Signed   CHO/MEDQ  D:  12/25/2008  T:  12/25/2008  Job:  [id number]   cc:   Rollene Rotunda, MD, Berniece Andreas C. Eden Emms, MD, [facility]  Neta Mends. Fabian Sharp, MD

## 2011-05-06 NOTE — Assessment & Plan Note (Signed)
[facility]                            CARDIOLOGY OFFICE NOTE   DWAN, HEMMELGARN                      MRN:          [id number]  DATE:12/01/2008                            DOB:          [birth year]    PRIMARY CARE PHYSICIAN:  Neta Mends. Panosh, MD   REASON FOR PRESENTATION:  Evaluate the patient with systolic murmur.   HISTORY OF PRESENT ILLNESS:  The patient is a very pleasant 55 year old  gentleman who was noted to have a murmur a year ago on a work physical.  However, he deferred any evaluation.  He saw Dr. Fabian Sharp recently and she  convinced him to have an echocardiogram.  He had that done today.  It  demonstrates severe mitral regurgitation with posterior prolapsing and  flail leaflet.  The left atrial dimension is slightly enlarged.  The  ejection fraction is well preserved.   The patient works in active job, though he does not exercise routinely.  He has no significant symptoms.  He denies any shortness of breath  unless it is very cold outside.  This has been a long-standing issue,  however.  He has no PND or orthopnea.  He will rarely have some  palpitations.  These have not been associated with presyncope or  syncope.  He has not had any chest pressure, neck, or arm discomfort.  He will occasionally get some fleeting chest discomfort under his left  breast.  This has been chronic for the last couple of years.  He cannot  bring this on with any activity.  He has never had any other  cardiovascular testing.   PAST MEDICAL HISTORY:  Hyperlipidemia x2 years.   PAST SURGICAL HISTORY:  Right hernia repair.   ALLERGIES:  None.   MEDICATIONS:  1. Simvastatin 20 mg daily.  2. Aspirin 81 mg daily.   SOCIAL HISTORY:  The patient is a Naval architect.  He is married.  He has  4 children and 1 stepchild.  He does not smoke cigarettes.  He  occasionally drinks alcohol.   FAMILY HISTORY:  Noncontributory for early coronary artery disease,  though  both of his parents in an elderly age have had bypass.  They are  alive and well.  He interestingly had 2 sisters with rheumatic fever as  children, but no apparent sequelae.  He did not have this that he has  ever have been told.   REVIEW OF SYSTEMS:  As stated in the HPI and positive for reading  glasses, palpitations as described, and reflux.  Negative for all other  systems.   PHYSICAL EXAMINATION:  GENERAL:  The patient is pleasant and in no  distress.  VITAL SIGNS:  Blood pressure 123/78, heart rate 60 and regular, weight  186 pounds, and body mass index 24.  HEENT:  Eyelids are unremarkable, pupils are equal, round, and reactive  to light, fundi within normal limits, oral mucosa unremarkable.  NECK:  No jugular venous distention at 45 degrees.  Carotid upstroke  brisk and symmetric, no bruits, transmitted systolic murmur, no  thyromegaly.  LYMPHATICS:  No cervical, axillary,  or inguinal adenopathy.  LUNGS:  Clear to auscultation bilaterally.  BACK:  No costovertebral angle tenderness.  CHEST:  Unremarkable.  HEART:  PMI not displaced or sustained, S1 and S2 within normal limits,  no S3, no S4, 3/6 apical holosystolic murmur radiating to the entire  anterior precordium, but not to the axilla, no diastolic murmurs.  ABDOMEN:  Flat, positive bowel sounds.  Normal in frequency and pitch,  no bruits, no rebound, no guarding, no midline pulsatile mass, no  hepatomegaly, no splenomegaly.  SKIN:  No rashes, no nodules.  EXTREMITIES:  Pulses are 2+ throughout, no edema, no cyanosis, no  clubbing.  NEURO:  Oriented to person, place, and time, cranial nerves II-XII are  grossly intact, motor grossly intact.   EKG; sinus rhythm, rate 60, axis within normal limits, intervals within  normal limits, and no acute ST-T wave changes.   ASSESSMENT AND PLAN:  1. Mitral regurgitation.  The patient has severe mitral regurgitation      wrapping the left atrium.  He has an abnormal posterior  leaflet as      described.  I have reviewed this extensively with the patient and      his wife.  This looks to be a repairable leaflet.  It is something      that should be fixed in the next few months in my opinion.  I will      get a transesophageal echocardiography as the next step in      evaluating this.  We will then proceed with probable      catheterization and surgical referral.  2. Dyslipidemia.  He will remain on the medications as listed.  We can      check a lipid profile.  3. Fatigue.  This may or may not be related to his valve though I      could not guarantee this.  I would like to see if Dr. Fabian Sharp has      done the usual labs including CBC, TSH, and vitamin D.  4. Erectile dysfunction.  We will discuss this further.  5. Followup.  I will see the patient again after his transesophageal      echocardiography.     Rollene Rotunda, MD, [facility]  Electronically Signed    JH/MedQ  DD: 12/01/2008  DT: 12/02/2008  Job #: [id number]   cc:   Neta Mends. Fabian Sharp, MD

## 2011-05-06 NOTE — Consult Note (Signed)
NAME:  Jeremy Wolf, Jeremy Wolf NO.:  [id number]   MEDICAL RECORD NO.:  [id number]          PATIENT TYPE:  AMB   LOCATION:  ENDO                         FACILITY:  [facility]   PHYSICIAN:  Peter C. Eden Emms, MD, FACCDATE OF BIRTH:  1956-01-14   DATE OF CONSULTATION:  DATE OF DISCHARGE:                                 CONSULTATION   A 55 year old patient with severe mitral insufficiency assess mitral  valve anatomy.   The patient was sedated with 6 mg of Versed and 100 mcg of fentanyl.   Using digital technique, an OmniPlane probe was advanced into the distal  esophagus without incident.   Transgastric imaging revealed mild left ventricular cavity enlargement,  the EF was preserved at 55-60%.   The papillary muscles and primary cords appeared intact.   The posterior leaflet of the mitral valve was markedly abnormal.  The  middle scallop P2 and the lateral scallop P1 appeared to be severely  prolapsed.  The middle scallop also appeared to be flail.   There was severe eccentric mitral insufficiency which coursed anteriorly  and wrapped the entire left atrium.  There was a mild left atrial  enlargement.  The atrial septum was stretched but did not have a PFO.  Right-sided cardiac chambers were normal.  Aortic valve was trileaflet  and structurally normal.  Left atrial appendage was normal with no  thrombus.  Imaging of the aorta showed no significant debris.   FINAL IMPRESSION:  1. Severe posterior mitral leaflet prolapse involving the P1 and P2      segments.  Probable flail segment to the P2 scallop with severe      anteriorly directed MR.  This valve should be repairable.  2. Normal aortic valve.  3. Mild left ventricular cavity enlargement, normal left ventricular      function, ejection fraction 55-60%.  4. Normal right heart chambers.  5. No left atrial appendage sinus.  6. No atrial septal defect.   I personally called Dr. Tressie Stalker regarding seeing the patient  in  the next couple of weeks.  The patient will have a diagnostic right and  left heart catheterization by Dr. Antoine Poche which will be scheduled on  December 19, 2008.   I spoke to the patient's wife and extended family and advised that given  his current anatomy that he should probably proceed to mitral valve  repair sometime in the month of January.   The patient will follow up with Dr. Antoine Poche for his heart cath on  December 19, 2008, and then hopefully see Dr. Cornelius Moras in the first part of  January.      Noralyn Pick. Eden Emms, MD, [facility]  Electronically Signed     PCN/MEDQ  D:  12/11/2008  T:  12/11/2008  Job:  [id number]   cc:   Rollene Rotunda, MD, [facility]

## 2011-05-06 NOTE — Discharge Summary (Signed)
NAME:  Jeremy Wolf, Jeremy Wolf NO.:  [id number]   MEDICAL RECORD NO.:  [id number]          PATIENT TYPE:  INP   LOCATION:  2037                         FACILITY:  [facility]   PHYSICIAN:  [name], M.D. DATE OF BIRTH:  February 14, 1956   DATE OF ADMISSION:  01/23/2009  DATE OF DISCHARGE:  01/27/2009                               DISCHARGE SUMMARY   ADMITTING DIAGNOSES:  1. Mitral valve prolapse with severe mitral regurgitation.  2. History of hyperlipidemia.   DISCHARGE DIAGNOSES:  1. Mitral valve prolapse with severe mitral regurgitation.  2. History of hyperlipidemia.  3. Postoperative atrial fibrillation (resolved).  4. Mild acute blood loss anemia (last hematocrit 29.2).  5. Thrombocytopenia (last platelet count 94000).  6. Patent foramen ovale (closed)   PROCEDURE:  Right mini thoracotomy for mitral valve repair (quadrangular  resection of posterior leaflet with transposition of native chordae  tendineae x2, 34 mm Edwards Physio II ring annuloplasty) and closure of  patent foramen ovale by Dr. Cornelius Moras on January 23, 2009.   HISTORY OF PRESENT ILLNESS:  This is a 55 year old Caucasian male with a  past medical history of hyperlipidemia, who was told for many years that  he had a abnormal heart sounds and instructed to take antibiotics  prior to undergoing any dental procedure.  The patient has been  evaluated by Dr. Fabian Sharp at [facility].  She noted that the  patient had a prominent heart murmur, consistent with mitral  regurgitation.  The patient was then referred to Dr. Antoine Poche for  further cardiac evaluation, as well as to obtain an echocardiogram.  The  patient was seen by Dr. Antoine Poche on December 01, 2008.  He was found to  have mitral valve prolapse with severe MR.  An echocardiogram was then  performed.  The patient was found to have a preserved EF of 55-65%.  There was moderate thickening of the mitral valve.  There was a  partially flail mitral valve  prolapse involving the posterior leaflet  and severe MR (a great 4+ on a scale of 0-4).  The left atrium was mild  to moderately dilated as well.  The patient also underwent cardiac  catheterization on December 19, 2008.  This revealed normal coronary  artery anatomy with no significant coronary artery disease.  The patient  was noted to have mild pulmonary hypertension with a large V wave on  pulmonary capillary wedge pressure, however.  The patient was then  referred to Dr. Cornelius Moras for possible mitral valve repair.  He was seen and  evaluated by Dr. Cornelius Moras in the office on December 25, 2008.  The patient was  placed on amiodarone preoperatively, and he was admitted to [facility]  on January 23, 2009, to undergo a right mini thoracotomy for mitral  valve repair and closure of patent foramen ovale.   BRIEF HOSPITAL COURSE STAY:  The patient was extubated late the evening  of surgery.  He did go into postoperative atrial fibrillation.  Upon  arrival to the intensive care unit, he did convert back to normal sinus  rhythm with amiodarone.  The patient remained afebrile, hemodynamically  stable.  He was initially AAI paced.  He was volume overloaded and  diuresed accordingly.  He was also placed on Coumadin.  He was  transferred from the intensive care unit to [facility] for further  convalescence.  He was also found to have mild acute blood loss anemia.  He did not require a transfusion postoperatively.  Mediastinal tube was  removed on January 24, 2009.  Followup chest x-ray revealed no  pneumothorax, bibasilar atelectasis, right greater than left, and mild  enlargement of the cardiac silhouette.  Remaining chest tube was then  removed on January 26, 2009.  The patient continued to progress well  with cardiac rehab, and currently on postoperative day #3, the patient's  T-max 99.2, later became afebrile.  Vital signs were stable.  O2 sat was  97% on 2 liters nasal cannula.  On tele, the patient was in  sinus rhythm  with a primary A-V block.  Preoperative weight was 86 kg, today's weight  is 87 kg.   PHYSICAL EXAMINATION:  CARDIOVASCULAR:  Regular rate and rhythm.  No  murmur.  No rubs.  LUNGS:  Diminished at bases bilaterally.  EXTREMITIES:  No edema.  Wounds clean, dry, and continuing to heal.   Epicardial pacing wires were removed on this day; however, the patient  remains afebrile, hemodynamically stable.  He was discharged on January 27, 2009.  Today's laboratory studies revealed potassium of 4.5, BUN and  creatinine were 17 and 0.92 respectively.  CBC done on January 25, 2009,  revealed H&H to be 10.2 and 29.2 respectively with white count of 8700,  platelet count of 94,000.  Last chest x-ray done on January 25, 2009,  again revealed no evidence pneumothorax, bibasilar atelectasis, right  greater than left.  A chest x-ray will be obtained in the morning as  well.   Discharge instructions include the following,  1. The patient is to remain on low-fat, low-salt diet.  No driving or      lifting more than 10 pounds.  The patient is to continue with      breathing exercise daily, is to walk every day, and increase his      frequency and duration as tolerates.  He may shower.  He may      cleanse his wounds with mild soap and water.  He is to call the      office if any wound problems arise.  He will have a PT and INR      obtained 48 hours after his date of discharge.  The patient will      have to contact Dr. Jenene Slicker office to arrange this.  2. The patient needs to call Dr. Jenene Slicker office for a followup      appointment in 2 weeks, and the patient has a followup appointment      to see Dr. Cornelius Moras on February 05, 2009, at 12:30 p.m.  Prior to this      office appointment, a chest x-ray will be obtained.   DISCHARGE MEDICATIONS:  1. Enteric-coated aspirin 81 mg p.o. daily.  2. Simvastatin 20 mg p.o. at bedtime.  3. Amiodarone 200 mg p.o. 2 times daily.  4. Coumadin p.o.  daily or (as directed), dosage will be determined at      the time of discharge.  5. Oxycodone 5 mg 1-2 tablets q.4-6 h. as needed for pain.      Doree Fudge, PA  [name], M.D.  Electronically Signed    DZ/MEDQ  D:  01/26/2009  T:  01/27/2009  Job:  [id number]   cc:   Rollene Rotunda, MD, [facility]  Neta Mends. Fabian Sharp, MD

## 2011-05-06 NOTE — Assessment & Plan Note (Signed)
OFFICE VISIT   VANDY, TSUCHIYA  DOB:  23-Jul-1956                                        January 30, 2009  CHART #:  [id number]   The patient is a 55 year old gentleman who recently underwent mitral  valve repair through a right thoracotomy on January 23, 2009.  He was  being seen at Dr. Henrietta Hoover office today and complained about some redness  around his central line site over his right clavicle.  Since the office  to check on that issue, he otherwise is having some soreness in his  groin, some discomfort in his chest particularly when he coughs.   He has noted some hot flashes associated with taking oxycodone, but does  not had any fevers or chills.   PHYSICAL EXAMINATION:  GENERAL:  The patient is a 55 year old white  male, in no acute distress.  VITAL SIGNS:  Stable.  He is afebrile.  There is minimal erythema around the central line site in the right  supraclavicular region.   IMPRESSION:  Localized wound site infection, has no evidence of  cellulitis.  This can be treated with local care.  I advised him to use  topical antibiotic ointment such as Polysporin and keep the site  covered.  He has an appointment with Dr. Cornelius Moras next week and it can be re-  examined at that time.  There is no indication for systemic antibiotics.   Salvatore Decent Dorris Fetch, M.D.  Electronically Signed   SCH/MEDQ  D:  01/30/2009  T:  01/30/2009  Job:  [id number]   cc:   [name], M.D.  Rollene Rotunda, MD, [facility]  Neta Mends. Fabian Sharp, MD

## 2011-05-09 NOTE — Op Note (Signed)
NAME:  Jeremy Wolf, Jeremy Wolf NO.:  [id number]   MEDICAL RECORD NO.:  [id number]                   PATIENT TYPE:  AMB   LOCATION:  DAY                                  FACILITY:  [facility]   PHYSICIAN:  Angelia Mould. Derrell Lolling, M.D.             DATE OF BIRTH:  12-01-1956   DATE OF PROCEDURE:  05/13/2004  DATE OF DISCHARGE:                                 OPERATIVE REPORT   PREOPERATIVE DIAGNOSIS:  Right inguinal hernia.   POSTOPERATIVE DIAGNOSIS:  Right inguinal hernia.   OPERATION PERFORMED:  Laparoscopic preperitoneal repair of right inguinal  hernia with polypropylene mesh.   SURGEON:  Dr. Claud Kelp   OPERATIVE INDICATIONS:  This is a 55 year old white man, who has had a large  right inguinal hernia for several years.  It has become more painful.  On  exam, he has a moderately large right inguinal hernia.  There is no evidence  of hernia on the left clinically.  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  He is brought to the operating  room electively for repair of his right inguinal hernia.   OPERATIVE TECHNIQUE:  Following the induction of general endotracheal  anesthesia, the bladder was emptied with a Foley catheter.  And after that  was done, the Foley balloon was deflated.  The abdomen and genitalia were  prepped and draped in a sterile fashion.  Marcaine 0.5% with epinephrine was  used for local infiltration anesthetic.  A curved transverse incision was  made at the lower rim of the umbilicus.  The fascia was incised  transversely, exposing the medial border of the right rectus muscle.  The  space medial and posterior to the right rectus muscle was bluntly dissected  with a Kelly clamp and my finger, and then a dissector balloon was inserted  into the right rectus sheath, down to the midline and the symphysis pubis.  Great care was taken to keep this anterior.  The video cam was inserted into  the lumen of  the balloon dissector trocar.  The balloon was inflated under  direct vision with air using the bulb.  The balloon deployed fairly nicely,  a little bit more on the right than on the left but did deploy quite nicely  in the preperitoneal space.  We held this in place for about 5 minutes for  tamponade and then removed the balloon.  The insufflation trocar was  inserted into the right rectus muscle.  The 10 mL balloon was inflated with  saline.  this was then secured and connected to the insufflator at 12 mmHg.  The preperitoneal space was insufflated.  Video cam was inserted.  We had  good visus of the symphysis pubis and what appeared to be a right direct  hernia.  We cleaned off the symphysis pubis and Cooper's ligament on both  sides.  We  identified the iliac vessels from their pulsations on both sides.  We identified the inferior epigastric vessels anteriorly, and they were left  uninjured.  We placed a 5 mm trocar into the right lower quadrant and a 5 mm  trocar in the left lower quadrant.  We cleaned off the right lower quadrant  abdominal muscles.  We mobilized the cord structures on the right.  We  searched for an indirect hernia, and he did not have an indirect hernia.  In  fact, we did see the edge of the peritoneum more superiorly and dissected  that back a little bit.  What he did have was a large direct hernia.  We  identified the hernia sac and dissected it away from the fatty tissues, and  this allowed the direct hernia pseudosac to retract anteriorly.  We then  made a search on the left side for hernia.  He did not have any evidence of  indirect or direct hernia on the left side, and we did dissect the cord  structures adequately, and we did identify the peritoneal edge more  superiorly.   The hernia on the right side was repaired with a large piece of  polypropylene convex mesh.  This was rolled up and inserted into the  preperitoneal space.  This was positioned so as to  overlap the symphysis  pubis medially about 1 cm, the Cooper's ligament inferiorly about 1 cm, and  it was then tacked in place with a 5 mm tacking device.  The tacks were  placed along the superior rim of the symphysis pubis and Cooper's ligament  as far as was safe.  We then tacked the mesh up along the midline and across  the posterior belly of the rectus muscle.  We were careful to avoid injury  to the inferior epigastric vessels.  Laterally, we tacked the lateral aspect  of the mesh to the abdominal wall muscles above the ileopubic tract.  We  were very careful to palpate the tip of the tacking device through the  abdominal wall to keep the tacks above the ileopubic tract.  After this was  done, the best fixation was complete.  We used 14 tacks.  The mesh lay quite  nicely over the direct and indirect spaces and appeared quite secure.  We  inspected for bleeding bilaterally, and there did not appear to be any  bleeding.  The trocars were removed and the pneumoperitoneum released.  The  fascia at the umbilicus was closed with two interrupted figure-of-eight  sutures of 0 Vicryl after we removed the insufflating trocar.  The skin  incisions were closed with subcuticular sutures of 4-0 Vicryl and Steri-  Strips.  Clean bandages were placed and the patient taken to the recovery  room in stable condition.  Estimated blood loss was about 20 mL.  Complications none.  Sponge, needle, and instrument counts were correct.                                               Angelia Mould. Derrell Lolling, M.D.    HMI/MEDQ  D:  05/13/2004  T:  05/13/2004  Job:  [id number]   cc:   Dario Guardian, M.D.  [address]  [place]  [place] [address]  Fax: [phone]

## 2011-07-03 ENCOUNTER — Encounter: Payer: Self-pay | Admitting: Internal Medicine

## 2011-07-09 ENCOUNTER — Ambulatory Visit (INDEPENDENT_AMBULATORY_CARE_PROVIDER_SITE_OTHER): Payer: 59 | Admitting: Internal Medicine

## 2011-07-09 ENCOUNTER — Encounter: Payer: Self-pay | Admitting: Internal Medicine

## 2011-07-09 VITALS — BP 134/82 | HR 66 | Ht 69.75 in | Wt 195.0 lb

## 2011-07-09 DIAGNOSIS — E785 Hyperlipidemia, unspecified: Secondary | ICD-10-CM

## 2011-07-09 DIAGNOSIS — Z0000 Encounter for general adult medical examination without abnormal findings: Secondary | ICD-10-CM | POA: Insufficient documentation

## 2011-07-09 DIAGNOSIS — E291 Testicular hypofunction: Secondary | ICD-10-CM

## 2011-07-09 DIAGNOSIS — F528 Other sexual dysfunction not due to a substance or known physiological condition: Secondary | ICD-10-CM

## 2011-07-09 DIAGNOSIS — Z9889 Other specified postprocedural states: Secondary | ICD-10-CM

## 2011-07-09 DIAGNOSIS — I341 Nonrheumatic mitral (valve) prolapse: Secondary | ICD-10-CM | POA: Insufficient documentation

## 2011-07-09 DIAGNOSIS — N200 Calculus of kidney: Secondary | ICD-10-CM | POA: Insufficient documentation

## 2011-07-09 LAB — BASIC METABOLIC PANEL
BUN: 18 mg/dL (ref 6–23)
Calcium: 9.5 mg/dL (ref 8.4–10.5)
Creatinine, Ser: 0.9 mg/dL (ref 0.4–1.5)
GFR: 90.92 mL/min (ref >60.00)
Glucose, Bld: 101 mg/dL — ABNORMAL HIGH (ref 70–99)
Sodium: 141 mEq/L (ref 135–145)

## 2011-07-09 LAB — PSA: PSA: 0.53 ng/mL (ref 0.10–4.00)

## 2011-07-09 LAB — POCT URINALYSIS DIPSTICK
Leukocytes, UA: NEGATIVE
Protein, UA: NEGATIVE
Urobilinogen, UA: 0.2
pH, UA: 5.5

## 2011-07-09 LAB — HEPATIC FUNCTION PANEL
Bilirubin, Direct: 0.1 mg/dL (ref 0.0–0.3)
Total Bilirubin: 0.8 mg/dL (ref 0.3–1.2)
Total Protein: 7.1 g/dL (ref 6.0–8.3)

## 2011-07-09 LAB — CBC WITH DIFFERENTIAL/PLATELET
Basophils Absolute: 0 10*3/uL (ref 0.0–0.1)
Eosinophils Absolute: 0.1 10*3/uL (ref 0.0–0.7)
Lymphocytes Relative: 35.7 % (ref 12.0–46.0)
MCHC: 34 g/dL (ref 30.0–36.0)
Neutrophils Relative %: 54.7 % (ref 43.0–77.0)
Platelets: 167 10*3/uL (ref 150.0–400.0)
RBC: 4.54 Mil/uL (ref 4.22–5.81)
RDW: 13.5 % (ref 11.5–14.6)

## 2011-07-09 LAB — LIPID PANEL
HDL: 47 mg/dL (ref >39.00)
Triglycerides: 122 mg/dL (ref 0.0–149.0)

## 2011-07-09 LAB — TESTOSTERONE: Testosterone: 127.76 ng/dL — ABNORMAL LOW (ref 350.00–890.00)

## 2011-07-09 MED ORDER — SIMVASTATIN 40 MG PO TABS: 40.0000 mg | ORAL_TABLET | Freq: Every day | ORAL | Status: DC

## 2011-07-09 NOTE — Progress Notes (Signed)
Subjective:    Patient ID: Jeremy Wolf, male    DOB: 06/09/1956, 55 y.o.   MRN: [id number]  HPI Patient comes in today for wellness visit. Followup of medications. Since his last visit he did have surgery on his C-spine for upper extremity weakness and numbness in December of 2011. He did well without complication he does have some mild residual numbness in his right fingers.  He ran out of his simvastatin most recently and hasn't been on it for almost a month. 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  He is off the AndroGel as he taking it 6 times a day but he noticed no difference but was lost to followup. He has mild obstructive sleep apnea no intervention needed. Denies any recent increase in snoring or fatigue. UTD PHC  Review of Systems ROS:  GEN/ HEENTNo fever, significant weight changes sweats headaches but does get opthalmic migraines  vision problems hearing changes, being followed for spot on right eye. CV/ PULM; No chest pain shortness of breath cough, syncope,edema  change in exercise tolerance. GI /GU: No adominal pain, vomiting, change in bowel habits. No blood in the stool. No significant GU symptoms. But Decrease libido and ED .   SKIN/HEME: ,no acute skin rashes suspicious lesions or bleeding. No lymphadenopathy, nodules, masses.  NEURO/ PSYCH:  No neurologic signs such as weakness numbness No depression anxiety. IMM/ Allergy: No unusual infections.  Allergy .    REST of 12 system review negative   Past history family history social history reviewed  And updated as possible. in the electronic medical record.     Objective:   Physical Exam Physical Exam: Vital signs  reviewed NGE:XBMW is a well-developed well-nourished alert cooperative  White male  who appears   stated age in no acute distress.  HEENT: normocephalic  traumatic , Eyes: PERRL EOM's full, conjunctiva clear, Nares: patent no deformity discharge or tenderness., Ears: no deformity EAC's clear TMs with normal landmarks. Mouth: clear OP, no lesions, edema.  Moist mucous membranes. Dentition in adequate repair. NECK: supple without masses, thyromegaly or bruits. Well healed scar CHEST/PULM:  Clear to auscultation and percussion breath sounds equal no wheeze , rales or rhonchi. No chest wall deformities or tenderness. CV: PMI is nondisplaced, S1 S2 no gallops, murmurs, rubs. Peripheral pulses are full without delay.No JVD .  ABDOMEN: Bowel sounds normal nontender  No guard or rebound, no hepato splenomegal no CVA tenderness.  No hernia. Extremtities:  No clubbing cyanosis , no acute joint swelling or redness no focal atrophy VV noted with loss of hair on le pulses intact and normal cap refill  NEURO:  Oriented x3, cranial nerves 3-12 appear to be intact, no obvious focal weakness,gait within normal limits no abnormal reflexes or asymmetrical SKIN: No acute rashes normal turgor, color, no bruising or petechiae. PSYCH: Oriented, good eye contact, no obvious depression anxiety, cognition and judgment appear normal. LN:  No cervical axillary or inguinal adenopathy GU rectal prostate 1+ no nodules or tenderness no masses     Assessment & Plan:  Preventive Health Care Counseled regarding healthy nutrition, exercise, sleep, injury prevention, calcium vit  d and healthy weight . DIsc sleep etc. Is somewhat difficult for her to come in on a regular basis Fridays and very early in the mornings at this time because of his job.  Elevated LIPIDS    Out f med srecnetly Non critical CAD by cath [facility] MVRepair  Last echo a year ago and stable with some LAE. Good lv function.  Hypogonadism   Patient relates sx to  after inguinal herniorrhaphy   But Didn't respond to adrogel in past but never had adequate follow up  check level today and baseline PSA will review records and make plan for recommendation to try different replacement therapy.  Mild osa    Weight control

## 2011-07-09 NOTE — Patient Instructions (Addendum)
We'll review your record and make recommendations about any cardiovascular followup needed.  Consider other testosterone replacement. Leg elevation and activity as possible to reduce risk of leg swelling. Will notify you  of labs when available. Then plan  Follow up. Any med changes .

## 2011-07-10 DIAGNOSIS — Z9889 Other specified postprocedural states: Secondary | ICD-10-CM | POA: Insufficient documentation

## 2011-07-21 ENCOUNTER — Telehealth: Payer: Self-pay | Admitting: Cardiology

## 2011-07-21 NOTE — Telephone Encounter (Signed)
Lm on voicemail for pt to call back

## 2011-07-21 NOTE — Telephone Encounter (Signed)
C/o strange pain in his chest. Radiate down to back / arm. Pt is a Naval architect.

## 2011-07-21 NOTE — Telephone Encounter (Signed)
Per pt - he has have 2 episodes now with chest pain from the center of his chest around to his back and left arm pain lasting about 10 minutes, no n/v or SOB and resolves on their own.  Both times he was at rest.  Once while laying in bed and once sitting in his truck.  Pt is pain free and this time.  He was given an appointment with Tereso Newcomer, PA at 9 am in the morning.  He is instructed to report to the closest ED if s/s reoccur prior to then.  He states understanding.

## 2011-07-22 ENCOUNTER — Ambulatory Visit: Payer: Self-pay

## 2011-07-22 ENCOUNTER — Ambulatory Visit (INDEPENDENT_AMBULATORY_CARE_PROVIDER_SITE_OTHER): Payer: 59 | Admitting: Physician Assistant

## 2011-07-22 ENCOUNTER — Telehealth: Payer: Self-pay | Admitting: *Deleted

## 2011-07-22 ENCOUNTER — Ambulatory Visit (HOSPITAL_COMMUNITY)
Admission: RE | Admit: 2011-07-22 | Discharge: 2011-07-22 | Disposition: A | Discharge status: Home / Self Care | Payer: 59 | Source: Ambulatory Visit | Attending: Physician Assistant | Admitting: Physician Assistant

## 2011-07-22 ENCOUNTER — Encounter: Payer: Self-pay | Admitting: Physician Assistant

## 2011-07-22 VITALS — BP 129/80 | HR 76 | Ht 69.0 in | Wt 196.0 lb

## 2011-07-22 DIAGNOSIS — J984 Other disorders of lung: Secondary | ICD-10-CM | POA: Insufficient documentation

## 2011-07-22 DIAGNOSIS — M79609 Pain in unspecified limb: Secondary | ICD-10-CM | POA: Insufficient documentation

## 2011-07-22 DIAGNOSIS — Z136 Encounter for screening for cardiovascular disorders: Secondary | ICD-10-CM

## 2011-07-22 DIAGNOSIS — R079 Chest pain, unspecified: Secondary | ICD-10-CM

## 2011-07-22 DIAGNOSIS — Z9889 Other specified postprocedural states: Secondary | ICD-10-CM

## 2011-07-22 DIAGNOSIS — R109 Unspecified abdominal pain: Secondary | ICD-10-CM

## 2011-07-22 DIAGNOSIS — R0789 Other chest pain: Secondary | ICD-10-CM | POA: Insufficient documentation

## 2011-07-22 LAB — LIPASE: Lipase: 30 U/L (ref 11.0–59.0)

## 2011-07-22 LAB — URINALYSIS
Hgb urine dipstick: NEGATIVE
Ketones, ur: NEGATIVE
Leukocytes, UA: NEGATIVE
Specific Gravity, Urine: >=1.03 (ref 1.000–1.030)
Urine Glucose: NEGATIVE
Urobilinogen, UA: 0.2 (ref 0.0–1.0)

## 2011-07-22 LAB — BASIC METABOLIC PANEL
BUN: 19 mg/dL (ref 6–23)
CO2: 27 mEq/L (ref 19–32)
Calcium: 9 mg/dL (ref 8.4–10.5)
GFR: 90.91 mL/min (ref >60.00)
Glucose, Bld: 100 mg/dL — ABNORMAL HIGH (ref 70–99)

## 2011-07-22 LAB — HEPATIC FUNCTION PANEL
AST: 22 U/L (ref 0–37)
Albumin: 4.3 g/dL (ref 3.5–5.2)
Total Protein: 7.2 g/dL (ref 6.0–8.3)

## 2011-07-22 NOTE — Patient Instructions (Signed)
Your physician recommends that you schedule a follow-up appointment in: 2-3 weeks with Dr. Antoine Poche as per as per Lorin Picket weaver, PA-C  Your physician recommends that you return for lab work in: TODAY D-DIMER, U/A, BMET, LFT'S, LIPASE 786.50, 789.09  PLEASE MAKE AN APPOINTMENT TO HAVE AN ABDOMINAL ULTRASOUND DONE DX 789.09  Your physician has requested that you have an echocardiogram DX V45.89. Echocardiography is a painless test that uses sound waves to create images of your heart. It provides your doctor with information about the size and shape of your heart and how well your heart's chambers and valves are working. This procedure takes approximately one hour. There are no restrictions for this procedure.  Your physician has requested that you have an exercise tolerance test DX 786.50 AS PER SCOTT WEAVER, PA-C SCHEDULE THIS WITH HIM. For further information please visit [email or web address] Please also follow instruction sheet, as given.  A chest x-ray takes a picture of the organs and structures inside the chest, including the heart, lungs, and blood vessels DX 786.50. This test can show several things, including, whether the heart is enlarges; whether fluid is building up in the lungs; and whether pacemaker / defibrillator leads are still in place.

## 2011-07-22 NOTE — Assessment & Plan Note (Signed)
I hear a murmur on exam.  His mitral valve prosthesis appeared to be functioning well last year on echocardiogram.  Repeat echocardiogram now.

## 2011-07-22 NOTE — Telephone Encounter (Signed)
Error

## 2011-07-22 NOTE — Progress Notes (Signed)
History of Present Illness: Primary Cardiologist:  Dr. Rollene Rotunda  Jeremy Wolf is a 55 y.o. male with a h/o severe MR in the setting of MVP and PFO who is s/p MV repair and PFO closure in 01/2009.  PMH also includes post operative atrial fibrillation, hyperlipidemia, nephrolithiasis.  He is s/p C-spine surgery.  Last cardiac cath 12/09: oD1 50%, EF 65%.  Last echo 7/11: EF 55-60%, mild to mod RAE, mild LAE, MV prosthesis without significant MR or MS.  He had an ETT done 7/11 due to chest pain.  This was negative for ischemic changes.    He presents for evaluation of chest pain.  He has several different types of symptoms.  His chest discomfort is in the left lower chest.  It comes on at rest.  It will radiate up into his upper chest and into his left arm.  It may last for 5 minutes.  It's a heavy/dull discomfort.  He denies any associated symptoms such as shortness of breath.  He is a Naval architect.  He is able to exert himself without chest discomfort or shortness of breath.  He denies syncope.  Last night, while climbing into bed, he felt near syncopal.  This lasted for a few seconds.  This has never happened before.  He denies orthopnea or PND.  He has mild pedal edema that seems to be related to venous insufficiency.  He also notes a discomfort that starts in his left lower chest over his lower ribs that has been there for quite some time.  He denies palpitations.  Past Medical History  Diagnosis Date  . Allergy   . Hyperlipidemia   . Mitral valve prolapse     with severe regurgitation; s/p MV repair 2/10;  echo 7/11: EF 55-60%, mild to mod RAE, mild LAE, no MR or MS  . Allergic rhinitis   . Patent foramen ovale     s/p closure at MV repair in 2010  . Nephrolithiasis     hx of  . History of atrial fibrillation     post op AFib  . CAD (coronary artery disease)     cath 12/09: oD1 50%, EF 65%;  negative ETT 7/11    Current Outpatient Prescriptions  Medication Sig Dispense Refill  .  aspirin 81 MG EC tablet Take 81 mg by mouth daily.        . Multiple Vitamin (MULTIVITAMIN) tablet Take 1 tablet by mouth daily.        . simvastatin (ZOCOR) 40 MG tablet Take 1 tablet (40 mg total) by mouth at bedtime.  90 tablet  3  . Testosterone (ANDROGEL PUMP) 1.25 GM/ACT (1%) GEL Place onto the skin. 6 pumps daily         Allergies: No Known Allergies  Social history:  Never a smoker  ROS:  Please see the history of present illness.  He denies fevers, melena, hematochezia, hematuria, weight loss, productive cough.  All other systems reviewed and negative.   Vital Signs: BP 129/80  Pulse 76  Ht 5\' 9"  (1.753 m)  Wt 196 lb (88.905 kg)  BMI 28.94 kg/m2  PHYSICAL EXAM: Well nourished, well developed, in no acute distress HEENT: normal Neck: no JVD Endocrine: No thyromegaly Vascular: No carotid bruits Cardiac:  normal S1, S2; RRR; 1/6 systolic murmur Along left sternal border Lungs:  clear to auscultation bilaterally, no wheezing, rhonchi or rales Abd: soft, nontender, no hepatomegaly Ext: Very trace bilateral edema Skin: warm and dry  Neuro:  CNs 2-12 intact, no focal abnormalities noted  EKG:  Sinus rhythm, heart rate 78,  LAD, PRWP, NSSTTW changes, non conducted PAC, no significant change when compared to prior tracing.  ASSESSMENT AND PLAN:

## 2011-07-22 NOTE — Assessment & Plan Note (Addendum)
Symptoms somewhat atypical.  He does have a history of nephrolithiasis.  His symptoms do start in his lower chest/? flank.  He did not have any CVA tenderness on exam.  He does not feel that his symptoms are reminiscent of his previous kidney stone.  He did have a recent physical exam with his PCP.  Of note, his total cholesterol was over 200 as well as his LDL.  His triglycerides were okay.  With a lot of his chest symptoms, he does point to his left upper abdomen.  Etiology of his symptoms are unclear to me at this time.  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  The likelihood of this is quite low at this time.  I will also have him repeat a urinalysis to see if he has any significant hematuria.  I will also check LFTs, a basic metabolic panel and lipase.  I will also have him do an abdominal ultrasound to rule out intra-abdominal pathology causing his symptoms.  I will also have him undergo a followup routine exercise treadmill test to r/o the possibility of ischemia.  He will followup with either me or Dr. Antoine Poche in the next couple of weeks.

## 2011-07-22 NOTE — Assessment & Plan Note (Signed)
As noted, check labs and abdominal ultrasound.

## 2011-08-01 ENCOUNTER — Telehealth: Payer: Self-pay | Admitting: *Deleted

## 2011-08-01 ENCOUNTER — Other Ambulatory Visit: Payer: Self-pay

## 2011-08-01 DIAGNOSIS — R109 Unspecified abdominal pain: Secondary | ICD-10-CM

## 2011-08-01 NOTE — Telephone Encounter (Signed)
PLEASE SCHEDULE THIS PT FOR AN ABDOMINAL ULTRASOUND 789.09.Marland KitchenMarland KitchenSCAN THE WHOLE BELLY INCLUDING PT'S AORTA AS WELL PER SCOTT WEAVER, PA-C. Danielle Rankin

## 2011-08-04 ENCOUNTER — Ambulatory Visit (INDEPENDENT_AMBULATORY_CARE_PROVIDER_SITE_OTHER): Payer: 59 | Admitting: Physician Assistant

## 2011-08-04 ENCOUNTER — Ambulatory Visit (HOSPITAL_BASED_OUTPATIENT_CLINIC_OR_DEPARTMENT_OTHER): Payer: 59 | Admitting: Radiology

## 2011-08-04 ENCOUNTER — Encounter: Payer: 59 | Admitting: Cardiology

## 2011-08-04 ENCOUNTER — Ambulatory Visit (INDEPENDENT_AMBULATORY_CARE_PROVIDER_SITE_OTHER): Payer: 59

## 2011-08-04 ENCOUNTER — Ambulatory Visit (HOSPITAL_COMMUNITY)
Admission: RE | Admit: 2011-08-04 | Discharge: 2011-08-04 | Disposition: A | Discharge status: Home / Self Care | Payer: 59 | Source: Ambulatory Visit | Attending: Physician Assistant | Admitting: Physician Assistant

## 2011-08-04 ENCOUNTER — Other Ambulatory Visit: Payer: Self-pay | Admitting: Physician Assistant

## 2011-08-04 DIAGNOSIS — R109 Unspecified abdominal pain: Secondary | ICD-10-CM

## 2011-08-04 DIAGNOSIS — I059 Rheumatic mitral valve disease, unspecified: Secondary | ICD-10-CM

## 2011-08-04 DIAGNOSIS — R079 Chest pain, unspecified: Secondary | ICD-10-CM

## 2011-08-04 DIAGNOSIS — Z9889 Other specified postprocedural states: Secondary | ICD-10-CM

## 2011-08-04 DIAGNOSIS — R072 Precordial pain: Secondary | ICD-10-CM

## 2011-08-04 DIAGNOSIS — R0989 Other specified symptoms and signs involving the circulatory and respiratory systems: Secondary | ICD-10-CM

## 2011-08-04 NOTE — Progress Notes (Signed)
Exercise Treadmill Test  Pre-Exercise Testing Evaluation Rhythm: normal sinus  Rate: 76   PR:  .19 QRS:  .08  QT:  .38 QTc: .43     Test  Exercise Tolerance Test Ordering MD: Tereso Newcomer PA-C  Interpreting MD:  Tereso Newcomer PA-C  Unique Test No: 1  Treadmill:  1  Indication for ETT: chest pain - rule out ischemia  Contraindication to ETT: No   Stress Modality: exercise - treadmill  Cardiac Imaging Performed: non   Protocol: standard Bruce - maximal  Max BP:  167/89  Max MPHR (bpm):  166 85% MPR (bpm):  141  MPHR obtained (bpm):  153 % MPHR obtained:  94  Reached 85% MPHR (min:sec): 6:19 Total Exercise Time (min-sec):  7:02  Workload in METS:  11.8 Borg Scale: 15  Reason ETT Terminated:  patient's desire to stop    ST Segment Analysis At Rest: normal ST segments - no evidence of significant ST depression With Exercise: no evidence of significant ST depression  Other Information Arrhythmia:  Occ. PVCs Angina during ETT:  absent (0) Quality of ETT:  diagnostic  ETT Interpretation:  normal - no evidence of ischemia by ST analysis  Comments: Good exercise tolerance. No chest pain. Normal BP response to exercise. No ST-T changes to suggest ischemia.  Recommendations: Follow up with Dr. Antoine Poche.

## 2011-08-05 ENCOUNTER — Telehealth: Payer: Self-pay | Admitting: *Deleted

## 2011-08-05 NOTE — Telephone Encounter (Signed)
pt aware of both echo and abdominal US results today. Danielle Rankin

## 2011-08-21 ENCOUNTER — Ambulatory Visit: Payer: 59 | Admitting: Physician Assistant

## 2011-08-22 ENCOUNTER — Encounter: Payer: Self-pay | Admitting: Cardiology

## 2011-08-22 ENCOUNTER — Ambulatory Visit (INDEPENDENT_AMBULATORY_CARE_PROVIDER_SITE_OTHER): Payer: 59 | Admitting: Cardiology

## 2011-08-22 DIAGNOSIS — R5381 Other malaise: Secondary | ICD-10-CM

## 2011-08-22 DIAGNOSIS — Z9889 Other specified postprocedural states: Secondary | ICD-10-CM

## 2011-08-22 DIAGNOSIS — I4891 Unspecified atrial fibrillation: Secondary | ICD-10-CM

## 2011-08-22 DIAGNOSIS — I059 Rheumatic mitral valve disease, unspecified: Secondary | ICD-10-CM

## 2011-08-22 DIAGNOSIS — R079 Chest pain, unspecified: Secondary | ICD-10-CM

## 2011-08-22 NOTE — Progress Notes (Signed)
HPI The patient presents for followup of mitral valve repair. Since I last saw him he has had a couple of episodes of chest discomfort. He describes some left-sided body. One happened while working on at rest. They lasted for a few minutes.  He did have one episode of dizziness.  The patient denies any new symptoms such as chest, neck or arm discomfort. There has been no new shortness of breath, PND or orthopnea. There have been no reported palpitations, presyncope or syncope.  He works vigorously without bringing on these symptoms.  He has had no symptoms in a few weeks.  He did have an echo which demonstrated a stable MV repair.  No Known Allergies  Current Outpatient Prescriptions  Medication Sig Dispense Refill  . aspirin 81 MG EC tablet Take 81 mg by mouth daily.        . Multiple Vitamin (MULTIVITAMIN) tablet Take 1 tablet by mouth daily.        . simvastatin (ZOCOR) 40 MG tablet Take 1 tablet (40 mg total) by mouth at bedtime.  90 tablet  3    Past Medical History  Diagnosis Date  . Allergy   . Hyperlipidemia   . Mitral valve prolapse     with severe regurgitation; s/p MV repair 2/10;  echo 7/11: EF 55-60%, mild to mod RAE, mild LAE, no MR or MS  . Allergic rhinitis   . Patent foramen ovale     s/p closure at MV repair in 2010  . Nephrolithiasis     hx of  . History of atrial fibrillation     post op AFib  . CAD (coronary artery disease)     cath 12/09: oD1 50%, EF 65%;  negative ETT 7/11    Past Surgical History  Procedure Date  . Thoracotomy     Rt miniature for mitral valve repair, quadrangular resection of posterior leaftlet with transposition of  native chordae tendineae x 2 and 34mm Edwards Physio II ring annuloplasty  . Patent foramen ovale closure   . Hernia repair     Inguinal 3 years ago  . Left testicular trauma     due to wrestling evaluated Dr Wanda Plump  . Spine surgery 12/09/2010    vertebra 5 & 6    ROS: PHYSICAL EXAM BP 126/88  Pulse 84  Ht 5' 9.5"  (1.765 m)  Wt 195 lb (88.451 kg)  BMI 28.38 kg/m2 GENERAL:  Well appearing HEENT:  Pupils equal round and reactive, fundi not visualized, oral mucosa unremarkable NECK:  No jugular venous distention, waveform within normal limits, carotid upstroke brisk and symmetric, no bruits, no thyromegaly LYMPHATICS:  No cervical, inguinal adenopathy LUNGS:  Clear to auscultation bilaterally BACK:  No CVA tenderness CHEST:  Well healed surgical scar HEART:  PMI not displaced or sustained,S1 and S2 within normal limits, no S3, no S4, no clicks, no rubs, no murmurs ABD:  Flat, positive bowel sounds normal in frequency in pitch, no bruits, no rebound, no guarding, no midline pulsatile mass, no hepatomegaly, no splenomegaly EXT:  2 plus pulses throughout, no edema, no cyanosis no clubbing SKIN:  No rashes no nodules NEURO:  Cranial nerves II through XII grossly intact, motor grossly intact throughout PSYCH:  Cognitively intact, oriented to person place and time  EKG:  NSR, LAD, no acute ST T wave changes  ASSESSMENT AND PLAN

## 2011-08-22 NOTE — Assessment & Plan Note (Signed)
I reviewed the 2009 cath report.  He had minimal diag disease.  I do not suspect that the current complaint is angina.  At this point I would not suggest further cardiovascular testing

## 2011-08-22 NOTE — Assessment & Plan Note (Signed)
This is unchanged.  No change in therapy is indicated.

## 2011-08-22 NOTE — Patient Instructions (Signed)
Follow up in 1 year with Dr Hochrein.  You will receive a letter in the mail 2 months before you are due.  Please call us when you receive this letter to schedule your follow up appointment.  The current medical regimen is effective;  continue present plan and medications.  

## 2011-08-22 NOTE — Assessment & Plan Note (Signed)
He has a stable valve repair. I reviewed with him the recent echocardiogram. No new testing is indicated. I will follow him clinically.

## 2011-09-03 ENCOUNTER — Telehealth: Payer: Self-pay | Admitting: *Deleted

## 2011-09-03 NOTE — Telephone Encounter (Signed)
Message copied by Romualdo Bolk on Wed Sep 03, 2011  4:58 PM ------      Message from: [facility] [place] [facility]      Created: Wed Sep 03, 2011  9:22 AM       Unsure if he ever got his lab results.   Apologies about this great delay.             His testosterone is still very low and I still think  adding to his fatigue.  It is good that he had another CV assessment that was normal. I realize he did n't get a good response on the androgel. But strongly advise other treatments.            Option 1   Try axiron  60 per day apply to each underarm each day ( have copay card and  30 day coupon if want to try this)       Option 2 : injectable testosterone   Every 2-3 weeks  ( would have to come in to get shots )             Either way we should recheck am testosterone level  And psa in 3 months and follow up visit.Marland Kitchen            Also his lipids were still high   ? Was he taking the simvastatin?    Can repeat this in 3 months also.             Would be happy to discuss this more  On phone or in person if desired  .

## 2011-09-03 NOTE — Telephone Encounter (Signed)
Left message for pt to call back  °

## 2011-09-10 LAB — PREPARE PLATELET PHERESIS

## 2011-09-11 NOTE — Telephone Encounter (Signed)
Left message to call back  

## 2011-09-17 MED ORDER — TESTOSTERONE 30 MG/ACT TD SOLN: 1.0000 | Freq: Every day | TRANSDERMAL | Status: DC

## 2011-09-17 NOTE — Telephone Encounter (Signed)
Pt's wife aware of results. She wants him to take axiron because she thinks that he will do this better. Also he hasn't taken his chol. Medication in about 2 months. Wife is going to get him to take his medication daily.

## 2011-09-17 NOTE — Telephone Encounter (Signed)
Pt wife is calling back concerning her hus. Pt wife stated she has permission to get hus info. Her name is julie wk [phone]

## 2011-09-25 LAB — POCT I-STAT 3, ART BLOOD GAS (G3+)
Bicarbonate: 24.5 mEq/L — ABNORMAL HIGH (ref 20.0–24.0)
O2 Saturation: 96 %
TCO2: 26 mmol/L (ref 0–100)
pCO2 arterial: 38 mmHg (ref 35.0–45.0)
pH, Arterial: 7.418 (ref 7.350–7.450)

## 2011-09-25 LAB — POCT I-STAT 3, VENOUS BLOOD GAS (G3P V)
Acid-base deficit: 1 mmol/L (ref 0.0–2.0)
O2 Saturation: 64 %
TCO2: 27 mmol/L (ref 0–100)
pCO2, Ven: 46.1 mmHg (ref 45.0–50.0)

## 2012-02-11 ENCOUNTER — Telehealth: Payer: Self-pay | Admitting: Internal Medicine

## 2012-02-11 NOTE — Telephone Encounter (Signed)
Pt needs DOT physical asap

## 2012-02-12 NOTE — Telephone Encounter (Signed)
Get more infor  Is this a renewal or new. ? Does he need drug testing ? About what is needed and then we can work him in. May need cardiology to sign off or opinion about the heart status.

## 2012-02-13 NOTE — Telephone Encounter (Signed)
This is a renewal. No drug testing. Standard DOT CPX. I advised pt that his last cpx with [place] was back in July 12. Pt states that he will have his employer do the DOT cpx and come in July for his cpx with [place].

## 2012-06-28 ENCOUNTER — Other Ambulatory Visit (INDEPENDENT_AMBULATORY_CARE_PROVIDER_SITE_OTHER): Payer: 59

## 2012-06-28 DIAGNOSIS — Z0000 Encounter for general adult medical examination without abnormal findings: Secondary | ICD-10-CM

## 2012-06-28 LAB — LIPID PANEL
Cholesterol: 191 mg/dL (ref 0–200)
HDL: 50.5 mg/dL (ref >39.00)
Triglycerides: 73 mg/dL (ref 0.0–149.0)
VLDL: 14.6 mg/dL (ref 0.0–40.0)

## 2012-06-28 LAB — CBC WITH DIFFERENTIAL/PLATELET
Basophils Absolute: 0 10*3/uL (ref 0.0–0.1)
Eosinophils Absolute: 0.2 10*3/uL (ref 0.0–0.7)
Hemoglobin: 13.8 g/dL (ref 13.0–17.0)
Lymphocytes Relative: 42.1 % (ref 12.0–46.0)
MCHC: 33.3 g/dL (ref 30.0–36.0)
Monocytes Absolute: 0.3 10*3/uL (ref 0.1–1.0)
Neutro Abs: 2.3 10*3/uL (ref 1.4–7.7)
RDW: 13.4 % (ref 11.5–14.6)

## 2012-06-28 LAB — BASIC METABOLIC PANEL
CO2: 26 mEq/L (ref 19–32)
Calcium: 9.1 mg/dL (ref 8.4–10.5)
Creatinine, Ser: 0.9 mg/dL (ref 0.4–1.5)
Glucose, Bld: 101 mg/dL — ABNORMAL HIGH (ref 70–99)

## 2012-06-28 LAB — POCT URINALYSIS DIPSTICK
Blood, UA: NEGATIVE
Ketones, UA: NEGATIVE
Protein, UA: NEGATIVE
Spec Grav, UA: 1.025
pH, UA: 5.5

## 2012-06-28 LAB — HEPATIC FUNCTION PANEL
Albumin: 3.9 g/dL (ref 3.5–5.2)
Alkaline Phosphatase: 61 U/L (ref 39–117)

## 2012-07-06 ENCOUNTER — Encounter: Payer: Self-pay | Admitting: Internal Medicine

## 2012-07-06 ENCOUNTER — Ambulatory Visit (INDEPENDENT_AMBULATORY_CARE_PROVIDER_SITE_OTHER): Payer: 59 | Admitting: Internal Medicine

## 2012-07-06 VITALS — BP 130/90 | HR 83 | Temp 98.3°F | Ht 69.75 in | Wt 196.0 lb

## 2012-07-06 DIAGNOSIS — Z0000 Encounter for general adult medical examination without abnormal findings: Secondary | ICD-10-CM

## 2012-07-06 DIAGNOSIS — F528 Other sexual dysfunction not due to a substance or known physiological condition: Secondary | ICD-10-CM

## 2012-07-06 DIAGNOSIS — M722 Plantar fascial fibromatosis: Secondary | ICD-10-CM

## 2012-07-06 DIAGNOSIS — Z9889 Other specified postprocedural states: Secondary | ICD-10-CM

## 2012-07-06 DIAGNOSIS — E291 Testicular hypofunction: Secondary | ICD-10-CM

## 2012-07-06 DIAGNOSIS — E785 Hyperlipidemia, unspecified: Secondary | ICD-10-CM

## 2012-07-06 DIAGNOSIS — R6882 Decreased libido: Secondary | ICD-10-CM

## 2012-07-06 HISTORY — DX: Plantar fascial fibromatosis: M72.2

## 2012-07-06 NOTE — Patient Instructions (Signed)
Continue lifestyle intervention healthy eating and exercise . Will arrange for urology  Opinion. 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 Your blood count is normal.  CPX in a  Year or as needed.

## 2012-07-06 NOTE — Progress Notes (Signed)
Subjective:    Patient ID: Jeremy Wolf, male    DOB: 26-Aug-1956, 56 y.o.   MRN: [id number]  HPI Patient comes in today for Preventive Health Care visit  Fu of med problems: CV stable following cards Fatigue  Sleeps sound and well  Has some  Sleepy episode  When driving.   No sleep  Apnea . No nodding. Sometimes worse after eating. Dec libido still continuing  Tried test replacement for about 6 months and not that helpful so stopped and not on meds for a while.   Ed meds may have helped a little bit .   Has plantar fasciitis   And under rx about the same left heel.  Outpatient Encounter Prescriptions as of 07/06/2012  Medication Sig Dispense Refill  . aspirin 81 MG EC tablet Take 81 mg by mouth daily.        . simvastatin (ZOCOR) 40 MG tablet Take 1 tablet (40 mg total) by mouth at bedtime.  90 tablet  3  . Multiple Vitamin (MULTIVITAMIN) tablet Take 1 tablet by mouth daily.        . Testosterone (AXIRON) 30 MG/ACT SOLN Place 1 Squirt onto the skin daily. Apply 1 pump under both arms daily or as directed  not taking     Review of Systems ROS:  GEN/ HEENT: No fever, significant weight changes sweats headaches vision problems hearing changes, CV/ PULM; No chest pain shortness of breath cough, syncope,edema  change in exercise tolerance. GI /GU: No adominal pain, vomiting, change in bowel habits. No blood in the stool. No significant GU symptoms. SKIN/HEME: ,no acute skin rashes suspicious lesions or bleeding. No lymphadenopathy, nodules, masses.  NEURO/ PSYCH:  No neurologic signs such as weakness numbness. No depression anxiety. IMM/ Allergy: No unusual infections.  Allergy .   Had some right hip groin pain worse with lifting now gone. REST of 12 system review negative except as per HPI Past history family history social history reviewed in the electronic medical record. Father had leukemia recently and poss hereditary     Objective:   Physical Exam BP 130/90  Pulse 83  Temp  98.3 F (36.8 C) (Oral)  Ht 5' 9.75" (1.772 m)  Wt 196 lb (88.905 kg)  BMI 28.33 kg/m2  SpO2 100% Physical Exam: Vital signs reviewed NWG:NFAO is a well-developed well-nourished alert cooperative  White male  who appears   stated age in no acute distress.  HEENT: normocephalic  traumatic , Eyes: PERRL EOM's full, conjunctiva clear, Nares: patent no deformity discharge or tenderness., Ears: no deformity EAC's clear TMs with normal landmarks. Mouth: clear OP, no lesions, edema.  Moist mucous membranes. Dentition in adequate repair. NECK: supple without masses, thyromegaly or bruits. CHEST/PULM:  Clear to auscultation and percussion breath sounds equal no wheeze , rales or rhonchi. No chest wall deformities or tenderness. Well healed chest lap scars  CV: PMI is nondisplaced, S1 S2 no gallops, murmurs, rubs. Peripheral pulses are full without delay.No JVD .  ABDOMEN: Bowel sounds normal nontender  No guard or rebound, no hepato splenomegal no CVA tenderness.  No hernia. Extremtities:  No clubbing cyanosis decrease hhair over stocking area  no acute joint swelling or redness no focal atrophy NEURO:  Oriented x3, cranial nerves 3-12 appear to be intact, no obvious focal weakness,gait within normal limits no abnormal reflexes or asymmetrical SKIN: No acute rashes normal turgor, color, no bruising or petechiae. PSYCH: Oriented, good eye contact, no obvious depression anxiety, cognition and judgment appear  normal. LN:  No cervical axillary or inguinal adenopathy Lab Results  Component Value Date   WBC 4.8 06/28/2012   HGB 13.8 06/28/2012   HCT 41.4 06/28/2012   PLT 171.0 06/28/2012   GLUCOSE 101* 06/28/2012   CHOL 191 06/28/2012   TRIG 73.0 06/28/2012   HDL 50.50 06/28/2012   LDLDIRECT 215.2 07/09/2011   LDLCALC 126* 06/28/2012   ALT 28 06/28/2012   AST 21 06/28/2012   NA 140 06/28/2012   K 4.3 06/28/2012   CL 107 06/28/2012   CREATININE 0.9 06/28/2012   BUN 25* 06/28/2012   CO2 26 06/28/2012   TSH 1.68 06/28/2012   PSA  0.61 06/28/2012   INR 2.0 05/23/2009   HGBA1C  Value: 5.2 (NOTE)   The ADA recommends the following therapeutic goal for glycemic   control related to Hgb A1C measurement:   Goal of Therapy:   < 7.0% Hgb A1C   Reference: American Diabetes Association: Clinical Practice   Recommendations 2008, Diabetes Care,  2008, 31:(Suppl 1). 01/19/2009       Assessment & Plan:  Preventive Health Care Counseled regarding healthy nutrition, exercise, sleep, injury prevention, calcium vit d and healthy weight .  MV disease  Stable at present .  Hypogonadal   No help with test  . Dec libido that onset was  After hernia surgery.   midly response to ed meds   Agree this is problematic  . Would like to get an updated urologic opinion about this.   Hyperlipidemia continue

## 2012-07-11 ENCOUNTER — Encounter: Payer: Self-pay | Admitting: Internal Medicine

## 2012-07-20 ENCOUNTER — Ambulatory Visit (INDEPENDENT_AMBULATORY_CARE_PROVIDER_SITE_OTHER): Payer: 59 | Admitting: Internal Medicine

## 2012-07-20 ENCOUNTER — Encounter: Payer: Self-pay | Admitting: Internal Medicine

## 2012-07-20 VITALS — BP 140/80 | HR 81 | Temp 98.1°F | Wt 200.0 lb

## 2012-07-20 DIAGNOSIS — Z9889 Other specified postprocedural states: Secondary | ICD-10-CM

## 2012-07-20 DIAGNOSIS — L089 Local infection of the skin and subcutaneous tissue, unspecified: Secondary | ICD-10-CM

## 2012-07-20 DIAGNOSIS — Q211 Atrial septal defect: Secondary | ICD-10-CM

## 2012-07-20 MED ORDER — DOXYCYCLINE HYCLATE 100 MG PO CAPS: 100.0000 mg | ORAL_CAPSULE | Freq: Two times a day (BID) | ORAL | Status: AC

## 2012-07-20 NOTE — Progress Notes (Signed)
  Subjective:    Patient ID: Jeremy Wolf, male    DOB: Jul 26, 1956, 56 y.o.   MRN: [id number]  HPI Patient comes in today for SDA for  new problem evaluation. Send in by boss /supervisor. Has had red area  right area almost like a pimple for up to 2 weeks that recently has gotten increased redness and swelling. There was a dark center that he used peroxide on fell off. Never any drainage no other treatment.  Family members work step advised he get checked. He does work and would but not any known specific  bite at that area.  He had a bump on his left thigh that resolved on its time.  Review of Systems No fever chills new joint pains   Other unusual rashes .  Past history family history social history reviewed in the electronic medical record. ? If any remote hx of mrsa or exposure.    Objective:   Physical Exam BP 140/80  Pulse 81  Temp 98.1 F (36.7 C) (Oral)  Wt 200 lb (90.719 kg)  SpO2 95% WDWN in nad Right thighwith a 1.5 cm induration non fluctuant  with center crusted head   And round flushed skin about 5 cm surrounding this.   Not a real  bull's-eye   Gait wnl     Assessment & Plan:   Right thigh infection with center  Poss infected bite but could be a staph infection   Small area  Begin antibiotic ans warm compresses  Small area if enlargens may need I and D.

## 2012-07-20 NOTE — Patient Instructions (Addendum)
This is an infection of either a bite or a poss staph infection either way we will treate with antibiotic and moist warm compresses. Expect improvement within the next 3 days or so if this is not happening or you get worse contact office for reevaluation.  Would not use peroxide unless you needed to debride a crust it can be irritating to the skin tissues.

## 2012-08-15 IMAGING — CR DG CHEST 2V
2 series · 2 of 2 positions shown · non-contrast
Comparison: 12/06/2010

CLINICAL DATA: Left lateral chest pain.  Left arm pain.  History of
heart valve repair.

CHEST - 2 VIEW

[w chest pa]
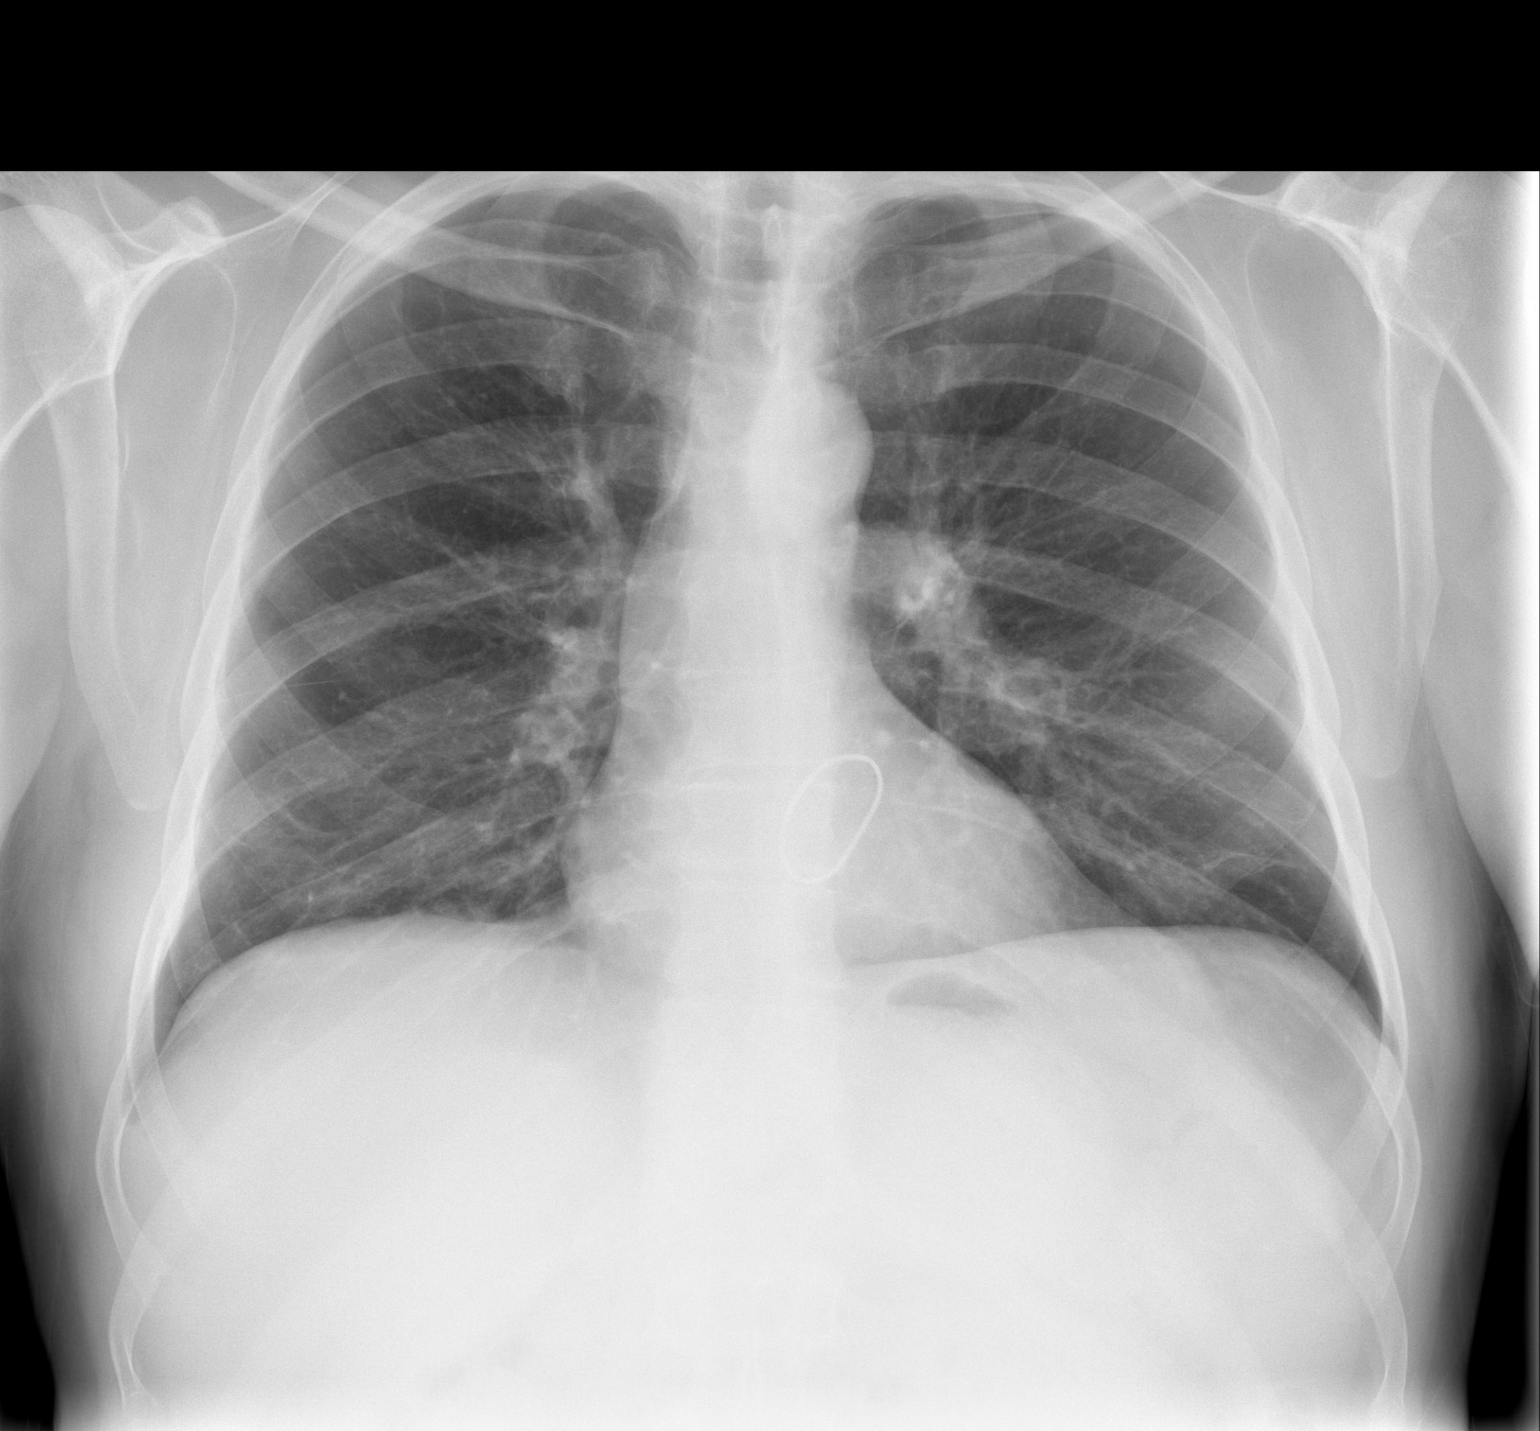

[w chest lat]
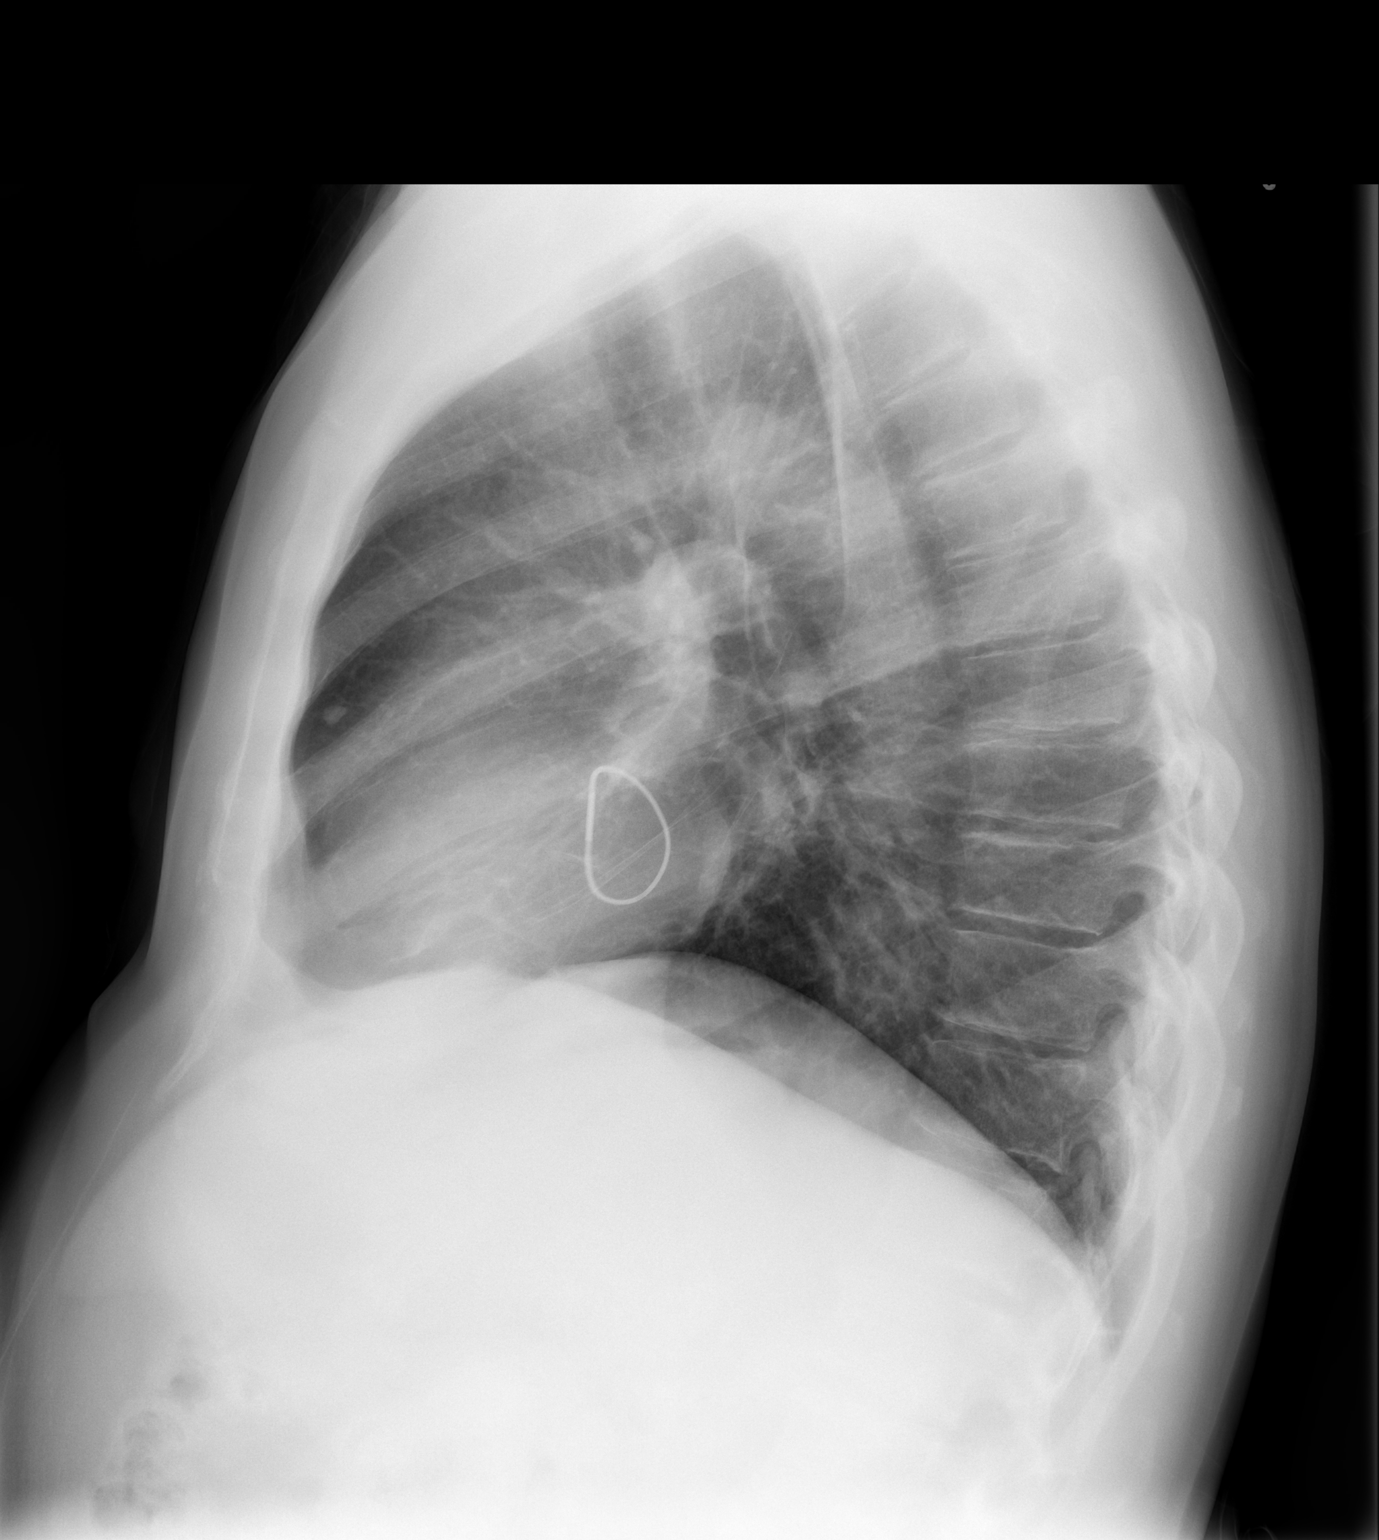

[2 of 2 positions shown; findings below may reference images not displayed]

FINDINGS: Anterior calcified nodule on the lateral view is
unchanged and likely within the right upper lobe on the frontal.

Prior mitral valve repair.  Lower cervical spine fixation. Midline
trachea.  Normal heart size and mediastinal contours. No pleural
effusion or pneumothorax.  Clear lungs.
IMPRESSION: No acute cardiopulmonary disease.

## 2012-11-02 ENCOUNTER — Encounter (INDEPENDENT_AMBULATORY_CARE_PROVIDER_SITE_OTHER): Payer: 59 | Admitting: Ophthalmology

## 2012-11-02 DIAGNOSIS — H43819 Vitreous degeneration, unspecified eye: Secondary | ICD-10-CM

## 2012-11-02 DIAGNOSIS — H2510 Age-related nuclear cataract, unspecified eye: Secondary | ICD-10-CM

## 2012-11-02 DIAGNOSIS — H35379 Puckering of macula, unspecified eye: Secondary | ICD-10-CM

## 2013-01-14 ENCOUNTER — Ambulatory Visit (INDEPENDENT_AMBULATORY_CARE_PROVIDER_SITE_OTHER): Payer: 59 | Admitting: Nurse Practitioner

## 2013-01-14 ENCOUNTER — Encounter: Payer: Self-pay | Admitting: *Deleted

## 2013-01-14 ENCOUNTER — Encounter: Payer: Self-pay | Admitting: Nurse Practitioner

## 2013-01-14 VITALS — BP 123/89 | HR 101 | Ht 69.0 in | Wt 197.1 lb

## 2013-01-14 DIAGNOSIS — R55 Syncope and collapse: Secondary | ICD-10-CM

## 2013-01-14 DIAGNOSIS — Z9889 Other specified postprocedural states: Secondary | ICD-10-CM

## 2013-01-14 LAB — BASIC METABOLIC PANEL
BUN: 20 mg/dL (ref 6–23)
CO2: 28 mEq/L (ref 19–32)
Chloride: 104 mEq/L (ref 96–112)
Glucose, Bld: 112 mg/dL — ABNORMAL HIGH (ref 70–99)
Potassium: 4.1 mEq/L (ref 3.5–5.1)
Sodium: 138 mEq/L (ref 135–145)

## 2013-01-14 LAB — MAGNESIUM: Magnesium: 2.1 mg/dL (ref 1.5–2.5)

## 2013-01-14 NOTE — Patient Instructions (Addendum)
Your physician recommends that you schedule a follow-up appointment in: 4 weeks with Dr Antoine Poche  Your physician recommends that you return for lab work BMP/MAG  Your physician has requested that you have an echocardiogram. Echocardiography is a painless test that uses sound waves to create images of your heart. It provides your doctor with information about the size and shape of your heart and how well your heart's chambers and valves are working. This procedure takes approximately one hour. There are no restrictions for this procedure.  Your physician has recommended that you wear an event monitor. Event monitors are medical devices that record the heart's electrical activity. Doctors most often [place] these monitors to diagnose arrhythmias. Arrhythmias are problems with the speed or rhythm of the heartbeat. The monitor is a small, portable device. You can wear one while you do your normal daily activities. This is usually used to diagnose what is causing palpitations/syncope (passing out).

## 2013-01-14 NOTE — Progress Notes (Signed)
Quick Note:  Please report to patient. The recent labs are stable. Continue same medication and careful diet. ______ 

## 2013-01-14 NOTE — Progress Notes (Signed)
Patient Name: Jeremy Wolf Date of Encounter: 01/14/2013  Primary Care Provider:  Lorretta Harp, MD Primary Cardiologist:  Shela Commons. Hochrein, MD  Patient Profile  55 her old male with prior history of mitral valve repair who presents with one-day history of presyncope and dizziness.  Problem List   Past Medical History  Diagnosis Date  . Allergy   . Hyperlipidemia   . Mitral valve prolapse     a. with severe regurgitation; s/p MV repair 2/10;  b. 07/2011 Echo: EF 50-55%, mild MR.  . Allergic rhinitis   . Patent foramen ovale     a. s/p closure at MV repair in 2010  . Nephrolithiasis     hx of  . History of atrial fibrillation     post op AFib  . CAD (coronary artery disease)     a. cath 12/09: oD1 50%, EF 65%;  b. negative ETT 7/11  . Plantar fasciitis of left foot 07/06/2012   Past Surgical History  Procedure Date  . Thoracotomy     Rt miniature for mitral valve repair, quadrangular resection of posterior leaftlet with transposition of  native chordae tendineae x 2 and 34mm Edwards Physio II ring annuloplasty  . Patent foramen ovale closure   . Hernia repair     Inguinal 3 years ago  . Left testicular trauma     due to wrestling evaluated Dr Wanda Plump  . Spine surgery 12/09/2010    vertebra 5 & 6    Allergies  No Known Allergies  HPI  34 her old male with the above problem list.  He was last in clinic in August 2012.  He underwent echocardiogram at that time which showed normal LV function and stable mitral valve.  Over the past few months, he has had at least 2 episodes of awakening early in the morning with the feeling that he had had a bowel movement associated with a dull sensation in his upper body, generalized weakness, and presyncope with some blurring of vision while he was having a bowel movement or attempted to have one.  This occurred again yesterday morning.  He was unable to have a bowel movement but did feel better after trying.  Throughout the  remainder of the day, he felt dizzy and "floaty" though he was able to complete his daily chores at work.  Today he continues to feel "floaty."  He denies chest pain, palpitations, dyspnea, pnd, orthopnea, n, v, syncope, edema, weight gain, or early satiety.  Home Medications  Prior to Admission medications   Medication Sig Start Date End Date Taking? Authorizing Provider  aspirin 81 MG EC tablet Take 81 mg by mouth daily.     Yes Historical Provider, MD  Multiple Vitamin (MULTIVITAMIN) tablet Take 1 tablet by mouth daily.     Yes Historical Provider, MD  simvastatin (ZOCOR) 40 MG tablet Take 1 tablet (40 mg total) by mouth at bedtime. 07/09/11 07/08/12  Madelin Headings, MD    Review of Systems  Weakness/malaise, dizziness as outlined above.  All other systems reviewed and are otherwise negative except as noted above.  Physical Exam  Blood pressure 123/89, pulse 101, height 5\' 9"  (1.753 m), weight 197 lb 1.9 oz (89.413 kg).   Orthostatic VS: lying 115/78, hr 86; sitting 123/78, 87;  Standing 133/94, 103;  Standing 2 mins 119/83, 90;  Standing 3 mins 123/89, 101. General: Pleasant, NAD Psych: Normal affect. Neuro: Alert and oriented X 3. Moves all extremities spontaneously. HEENT: Normal  Neck: Supple without bruits or JVD. Lungs:  Resp regular and unlabored, CTA. Heart: RRR no s3, s4, or murmurs. Abdomen: Soft, non-tender, non-distended, BS + x 4.  Extremities: No clubbing, cyanosis or edema. DP/PT/Radials 2+ and equal bilaterally.  Accessory Clinical Findings  ECG - Sinus rhythm, first-degree AV block, left axis deviation, no acute ST or T changes.  Assessment & Plan  1.  Dizziness/presyncope: Patient presents with complaints of abrupt GI upset that causes him to awaken the morning and becomes associated with upper body dull sensation and weakness as well as presyncope.  This occurred again yesterday and he had dizziness throughout the day.  He had orthostatics performed in clinic  today as outlined above.  He did have mild elevation of heart rate with standing.  He was symptomatic.  He drinks 12 cups of coffee a day and I did recommend he get back on his caffeine intake as he is likely dehydrated some extent.  We will check a basic metabolic panel today to have ulcerated for 2-D echocardiogram and 21 day event monitor to reevaluate LV function, mitral valve, rule out arrhythmias the cause of his symptoms.  2.  Dispo:  F/u with Dr. Antoine Poche in 1 month.  Nicolasa Ducking, NP 01/14/2013, 9:39 AM

## 2013-01-17 ENCOUNTER — Telehealth: Payer: Self-pay | Admitting: *Deleted

## 2013-01-17 ENCOUNTER — Ambulatory Visit (HOSPITAL_COMMUNITY): Payer: 59 | Attending: Cardiovascular Disease | Admitting: Radiology

## 2013-01-17 ENCOUNTER — Encounter (INDEPENDENT_AMBULATORY_CARE_PROVIDER_SITE_OTHER): Payer: 59

## 2013-01-17 DIAGNOSIS — I079 Rheumatic tricuspid valve disease, unspecified: Secondary | ICD-10-CM | POA: Insufficient documentation

## 2013-01-17 DIAGNOSIS — I059 Rheumatic mitral valve disease, unspecified: Secondary | ICD-10-CM | POA: Insufficient documentation

## 2013-01-17 DIAGNOSIS — I2510 Atherosclerotic heart disease of native coronary artery without angina pectoris: Secondary | ICD-10-CM | POA: Insufficient documentation

## 2013-01-17 DIAGNOSIS — E785 Hyperlipidemia, unspecified: Secondary | ICD-10-CM | POA: Insufficient documentation

## 2013-01-17 DIAGNOSIS — Z9889 Other specified postprocedural states: Secondary | ICD-10-CM

## 2013-01-17 DIAGNOSIS — R55 Syncope and collapse: Secondary | ICD-10-CM

## 2013-01-17 NOTE — Telephone Encounter (Signed)
Advised patient of lab results  

## 2013-01-17 NOTE — Progress Notes (Signed)
Echocardiogram performed.  

## 2013-01-17 NOTE — Telephone Encounter (Signed)
Message copied by Burnell Blanks on Mon Jan 17, 2013  3:09 PM ------      Message from: Cassell Clement      Created: Fri Jan 14, 2013  9:55 PM       Please report to patient.  The recent labs are stable. Continue same medication and careful diet.

## 2013-01-17 NOTE — Telephone Encounter (Signed)
Event monitor placed on Pt 01/17/13 TK

## 2013-01-19 ENCOUNTER — Other Ambulatory Visit: Payer: Self-pay | Admitting: Internal Medicine

## 2013-01-19 DIAGNOSIS — F528 Other sexual dysfunction not due to a substance or known physiological condition: Secondary | ICD-10-CM

## 2013-01-19 DIAGNOSIS — E785 Hyperlipidemia, unspecified: Secondary | ICD-10-CM

## 2013-01-19 DIAGNOSIS — Z0000 Encounter for general adult medical examination without abnormal findings: Secondary | ICD-10-CM

## 2013-01-19 DIAGNOSIS — Z9889 Other specified postprocedural states: Secondary | ICD-10-CM

## 2013-01-19 DIAGNOSIS — E291 Testicular hypofunction: Secondary | ICD-10-CM

## 2013-01-19 MED ORDER — SIMVASTATIN 40 MG PO TABS: 40.0000 mg | ORAL_TABLET | Freq: Every day | ORAL | Status: DC

## 2013-01-19 NOTE — Telephone Encounter (Signed)
Both sent by e-scribe.

## 2013-01-19 NOTE — Telephone Encounter (Signed)
Pt needs refill of simvastatin (ZOCOR) 40 MG tablet.   Pt did not get script when he was here for his physical in July. Pt had samples/extra bottles and has not needed to now.  [facility] [address] The remaining to pharm: [facility]. Pt is out and needs asap,

## 2013-01-21 ENCOUNTER — Telehealth: Payer: Self-pay | Admitting: *Deleted

## 2013-01-21 NOTE — Telephone Encounter (Signed)
Message copied by Burnell Blanks on Fri Jan 21, 2013  6:33 PM ------      Message from: Cassell Clement      Created: Mon Jan 17, 2013  8:35 PM       Please report.  The LV function is normal. The mitral valve repair is holding up reasonably well. There is only mild to moderate MR.  CSD. Keep appt with Dr. Antoine Poche next month.

## 2013-01-21 NOTE — Telephone Encounter (Signed)
Advised patient

## 2013-02-14 ENCOUNTER — Ambulatory Visit (INDEPENDENT_AMBULATORY_CARE_PROVIDER_SITE_OTHER): Payer: 59 | Admitting: Cardiology

## 2013-02-14 ENCOUNTER — Encounter: Payer: Self-pay | Admitting: Cardiology

## 2013-02-14 VITALS — BP 126/80 | HR 87 | Ht 69.0 in | Wt 196.0 lb

## 2013-02-14 NOTE — Progress Notes (Signed)
HPI The patient presents for followup of mitral valve repair. Since I last saw him he was seen by Ward Givens NP for evaluation of some dizziness. He had a couple of episodes of this at night. These are described previously. This was in January and he has not had any since then. He had some very mild orthostatic changes in the office. He did get into bed monitor and I reviewed this with him. There was no evidence of significant dysrhythmias. He had no symptoms while wearing this. He did have a followup echo which demonstrates a mild mitral regurgitation status post repair. His ejection fraction was normal. He denies any chest pressure, neck or arm discomfort. He's had no shortness of breath, PND or orthopnea. He does feel some rare palpitations. He had a little dizziness this morning only because he didn't eat.  No Known Allergies  Current Outpatient Prescriptions  Medication Sig Dispense Refill  . aspirin 81 MG EC tablet Take 81 mg by mouth daily.        . Multiple Vitamin (MULTIVITAMIN) tablet Take 1 tablet by mouth daily.        . simvastatin (ZOCOR) 40 MG tablet Take 1 tablet (40 mg total) by mouth at bedtime.  90 tablet  1   No current facility-administered medications for this visit.    Past Medical History  Diagnosis Date  . Allergy   . Hyperlipidemia   . Mitral valve prolapse     a. with severe regurgitation; s/p MV repair 2/10;  b. 07/2011 Echo: EF 50-55%, mild MR.  . Allergic rhinitis   . Patent foramen ovale     a. s/p closure at MV repair in 2010  . Nephrolithiasis     hx of  . History of atrial fibrillation     post op AFib  . CAD (coronary artery disease)     a. cath 12/09: oD1 50%, EF 65%;  b. negative ETT 7/11  . Plantar fasciitis of left foot 07/06/2012    Past Surgical History  Procedure Laterality Date  . Thoracotomy      Rt miniature for mitral valve repair, quadrangular resection of posterior leaftlet with transposition of  native chordae tendineae x 2 and  34mm Edwards Physio II ring annuloplasty  . Patent foramen ovale closure    . Hernia repair      Inguinal 3 years ago  . Left testicular trauma      due to wrestling evaluated Dr Wanda Plump  . Spine surgery  12/09/2010    vertebra 5 & 6    ROS:  Positive for ED.  Otherwise as stated in the HPI and negative for all other systems.  PHYSICAL EXAM BP 126/80  Pulse 87  Ht 5\' 9"  (1.753 m)  Wt 196 lb (88.905 kg)  BMI 28.93 kg/m2 GENERAL:  Well appearing HEENT:  Pupils equal round and reactive, fundi not visualized, oral mucosa unremarkable NECK:  No jugular venous distention, waveform within normal limits, carotid upstroke brisk and symmetric, no bruits, no thyromegaly LYMPHATICS:  No cervical, inguinal adenopathy LUNGS:  Clear to auscultation bilaterally BACK:  No CVA tenderness CHEST:  Well healed surgical scar HEART:  PMI not displaced or sustained,S1 and S2 within normal limits, no S3, no S4, no clicks, no rubs, no murmurs ABD:  Flat, positive bowel sounds normal in frequency in pitch, no bruits, no rebound, no guarding, no midline pulsatile mass, no hepatomegaly, no splenomegaly EXT:  2 plus pulses throughout, no edema, no cyanosis  no clubbing SKIN:  No rashes no nodules NEURO:  Cranial nerves II through XII grossly intact, motor grossly intact throughout PSYCH:  Cognitively intact, oriented to person place and time   ASSESSMENT AND PLAN  Mitral Valve Repair -  His echo in January continues to demonstrate some mild possibly moderate mitral regurgitation. I will repeat this in January of next year.  Of note he would like to start cardiac rehabilitation maintenance and I will make this referral.  Dizziness - This I reviewed with him he is telemetry. He had normal sinus rhythm with occasional PACs and PVCs. He didn't take his monitor off today. No further workup is suggested. He is to stay hydrated as he does have some mild orthostasis.

## 2013-02-14 NOTE — Patient Instructions (Addendum)
The current medical regimen is effective;  continue present plan and medications.  You may stop wearing the cardiac monitor.  You have been referred to Cardiac Rehab for maintenance program.  You will be contacted for this at a later date.  Your physician has requested that you have an echocardiogram in January 2015.Marland Kitchen Echocardiography is a painless test that uses sound waves to create images of your heart. It provides your doctor with information about the size and shape of your heart and how well your heart's chambers and valves are working. This procedure takes approximately one hour. There are no restrictions for this procedure.  Follow up in 6 months with Dr Antoine Poche.  You will receive a letter in the mail 2 months before you are due.  Please call us when you receive this letter to schedule your follow up appointment.

## 2013-04-14 ENCOUNTER — Telehealth: Payer: Self-pay | Admitting: Internal Medicine

## 2013-04-14 NOTE — Telephone Encounter (Signed)
Patient Information:  Caller Name: [place]  Phone: [phone]  Patient: Jeremy Wolf, Jeremy Wolf  Gender: Male  DOB: 06/27/1956  Age: 57 Years  PCP: Berniece Andreas [facility])  Office Follow Up:  Does the office need to follow up with this patient?: No  Instructions For The Office: N/A  RN Note:  ED/911 declined. Pt requested an appt for a physical. Transferred caller to office. Instructed caller to consider an sick appt.   Symptoms  Reason For Call & Symptoms: Pt finished a 30 heart monitor test in Feb/Mar 2013.   Today pt experienced some heaviness down his left arm w/indigestion.  No SOB.  Pt has MVP and was repaired which now has a small leakage. Pt experienced a heaviness  on left side of chest close to the shoulder and then radiated down the left arm.  Caller states he felt flushed for a moment and broke into a sweat but kept on working.  Pt developed an onset of indigestion.   Reviewed Health History In EMR: Yes  Reviewed Medications In EMR: Yes  Reviewed Allergies In EMR: Yes  Reviewed Surgeries / Procedures: Yes  Date of Onset of Symptoms: 04/14/2013  Guideline(s) Used:  Arm Pain  Chest Pain  Disposition Per Guideline:   Go to ED Now  Reason For Disposition Reached:   Pain also present in shoulder(s) or arm(s) or jaw  Advice Given: Go to ED  Patient Will Follow Care Advice:  YES

## 2013-05-02 ENCOUNTER — Ambulatory Visit (INDEPENDENT_AMBULATORY_CARE_PROVIDER_SITE_OTHER): Payer: 59 | Admitting: Ophthalmology

## 2013-05-02 DIAGNOSIS — H2510 Age-related nuclear cataract, unspecified eye: Secondary | ICD-10-CM

## 2013-05-02 DIAGNOSIS — H35379 Puckering of macula, unspecified eye: Secondary | ICD-10-CM

## 2013-05-02 DIAGNOSIS — H43819 Vitreous degeneration, unspecified eye: Secondary | ICD-10-CM

## 2013-06-09 ENCOUNTER — Other Ambulatory Visit: Payer: Self-pay | Admitting: Internal Medicine

## 2013-06-10 ENCOUNTER — Telehealth: Payer: Self-pay | Admitting: Internal Medicine

## 2013-06-10 MED ORDER — AMOXICILLIN 500 MG PO CAPS: 2000.0000 mg | ORAL_CAPSULE | Freq: Once | ORAL | Status: DC

## 2013-06-10 NOTE — Telephone Encounter (Signed)
Why does he take doxycyline ? Doesn't  he usually take amoxicillin? As this is usually first line. For prophylaxis  I assume this is for heart valve prolyl axis . Please confrim   If so I can send in some amoxicillin  To take 2 gram per procedure  ( ie 4#  500 mg  Of amoxicillin) if not alelrgic to pcn.  Further refills through cardiology or dentist

## 2013-06-10 NOTE — Telephone Encounter (Signed)
Spoke to the pt's wife.  He has no drug allergies in the chart and she confirmed with him that he is not allergic to PCN.  Sent in medication to the pharmacy.

## 2013-06-10 NOTE — Telephone Encounter (Signed)
Left message for Jeremy Wolf (wife) to return my call.

## 2013-06-10 NOTE — Telephone Encounter (Signed)
She was not sure why he is prescribed doxy by the dentist.

## 2013-06-10 NOTE — Telephone Encounter (Signed)
Pt must take antibiotic prior to dental appt. Pt is out of doxycycline (VIBRAMYCIN) 100 MG capsule. Dentist office is closed. Pt has lost previous RX. Pt would like to know if you will refill. [facility] [address]

## 2013-07-12 ENCOUNTER — Encounter: Payer: Self-pay | Admitting: Internal Medicine

## 2013-07-12 ENCOUNTER — Ambulatory Visit (INDEPENDENT_AMBULATORY_CARE_PROVIDER_SITE_OTHER): Payer: 59 | Admitting: Internal Medicine

## 2013-07-12 ENCOUNTER — Other Ambulatory Visit (INDEPENDENT_AMBULATORY_CARE_PROVIDER_SITE_OTHER): Payer: 59

## 2013-07-12 VITALS — BP 138/96 | HR 64 | Temp 97.8°F | Wt 197.0 lb

## 2013-07-12 DIAGNOSIS — H00019 Hordeolum externum unspecified eye, unspecified eyelid: Secondary | ICD-10-CM | POA: Insufficient documentation

## 2013-07-12 DIAGNOSIS — E785 Hyperlipidemia, unspecified: Secondary | ICD-10-CM

## 2013-07-12 DIAGNOSIS — Z9889 Other specified postprocedural states: Secondary | ICD-10-CM

## 2013-07-12 DIAGNOSIS — H00013 Hordeolum externum right eye, unspecified eyelid: Secondary | ICD-10-CM

## 2013-07-12 DIAGNOSIS — Z0000 Encounter for general adult medical examination without abnormal findings: Secondary | ICD-10-CM

## 2013-07-12 DIAGNOSIS — H019 Unspecified inflammation of eyelid: Secondary | ICD-10-CM | POA: Insufficient documentation

## 2013-07-12 LAB — CBC WITH DIFFERENTIAL/PLATELET
Basophils Absolute: 0 10*3/uL (ref 0.0–0.1)
Lymphocytes Relative: 41.7 % (ref 12.0–46.0)
Monocytes Relative: 7.1 % (ref 3.0–12.0)
Platelets: 186 10*3/uL (ref 150.0–400.0)
RDW: 13.5 % (ref 11.5–14.6)

## 2013-07-12 LAB — BASIC METABOLIC PANEL
CO2: 29 mEq/L (ref 19–32)
Calcium: 9.5 mg/dL (ref 8.4–10.5)
Glucose, Bld: 88 mg/dL (ref 70–99)
Potassium: 4.6 mEq/L (ref 3.5–5.1)
Sodium: 139 mEq/L (ref 135–145)

## 2013-07-12 LAB — LIPID PANEL
HDL: 47.5 mg/dL (ref >39.00)
Total CHOL/HDL Ratio: 5
Triglycerides: 136 mg/dL (ref 0.0–149.0)

## 2013-07-12 LAB — HEPATIC FUNCTION PANEL
AST: 16 U/L (ref 0–37)
Alkaline Phosphatase: 60 U/L (ref 39–117)
Total Bilirubin: 0.7 mg/dL (ref 0.3–1.2)

## 2013-07-12 LAB — LDL CHOLESTEROL, DIRECT: Direct LDL: 154.7 mg/dL

## 2013-07-12 LAB — TSH: TSH: 1.22 u[IU]/mL (ref 0.35–5.50)

## 2013-07-12 MED ORDER — DOXYCYCLINE HYCLATE 100 MG PO CAPS: 100.0000 mg | ORAL_CAPSULE | Freq: Two times a day (BID) | ORAL | Status: DC

## 2013-07-12 NOTE — Progress Notes (Signed)
Chief Complaint  Patient presents with  . right eye swollen    HPI: Patient comes in as a walk-in/ work in appointment. 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 About 2 days ago he felt like lower lid right eye your irritation driving back from [place]. No specific foreign body. Since then he has had increasing right lower eyelid swelling with pain that was worse this morning. May have slight discharge. No contacts no significant photophobia or vision change. No history of same no fever some sinus pressure. Has an eye doctor in [place]. ROS: See pertinent positives and negatives per HPI. No fever chills pulm cv sx at present . No new rash    Past Medical History  Diagnosis Date  . Allergy   . Hyperlipidemia   . Mitral valve prolapse     a. with severe regurgitation; s/p MV repair 2/10;  b. 07/2011 Echo: EF 50-55%, mild MR.  . Allergic rhinitis   . Patent foramen ovale     a. s/p closure at MV repair in 2010  . Nephrolithiasis     hx of  . History of atrial fibrillation     post op AFib  . CAD (coronary artery disease)     a. cath 12/09: oD1 50%, EF 65%;  b. negative ETT 7/11  . Plantar fasciitis of left foot 07/06/2012   Father has CLL is in his 47s declining treatment Family History  Problem Relation Age of Onset  . Diabetes Mother   . Hyperlipidemia Mother   . Hyperlipidemia Father   . Heart disease Father     heart surgery 84 ? cabd and valve  . Cancer Daughter 43    ovarian  . Lung cancer Maternal Grandfather     lung  . Emphysema Maternal Grandfather   . Arthritis Paternal Grandmother   . Arthritis Paternal Grandfather   . Rheumatic fever      2 sisters  . Other Sister     twin hx of substance use    History   Social History  . Marital Status: Married    Spouse Name: N/A    Number of Children: N/A  . Years of Education: N/A   Social History Main Topics  . Smoking status: Never Smoker   . Smokeless tobacco: None  . Alcohol Use: No  . Drug Use: No   . Sexually Active:    Other Topics Concern  . 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 aspirin 81 MG EC tablet Take 81 mg by mouth daily.        . simvastatin (ZOCOR) 40 MG tablet Take 1 tablet (40 mg total) by mouth at bedtime.  90 tablet  1  . doxycycline (VIBRAMYCIN) 100 MG capsule Take 1 capsule (100 mg total) by mouth 2 (two) times daily.  20 capsule  0  . [DISCONTINUED] amoxicillin (AMOXIL) 500 MG capsule Take 4 capsules (2,000 mg  total) by mouth once. Before dental procedure  4 capsule  0  . [DISCONTINUED] Multiple Vitamin (MULTIVITAMIN) tablet Take 1 tablet by mouth daily.         No facility-administered encounter medications on file as of 07/12/2013.    EXAM:  BP 138/96  Pulse 64  Temp(Src) 97.8 F (36.6 C) (Oral)  Wt 197 lb (89.359 kg)  BMI 29.08 kg/m2  Body mass index is 29.08 kg/(m^2).  GENERAL: vitals reviewed and listed above, alert, oriented, appears well hydrated and in no acute distress Obvious right lower eyelid swelling. Into upper sinus area that is dependent  Upper lig is normal  HEENT: atraumatic, conjunctiva bulbar seems clear clear,  right lower eye lid +2 edema there is a marble-sized nodule inferior medial area mildly tender version of lid shows a pinpoint white spot internal/external lid is normal. Minimal discharge noted no foreign body obvious eoms are normal  Nose is clear TMs are clear as well as EACs NECK: no obvious masses on inspection palpation no adenopathyL MS: moves all extremities without noticeable focal   abnormality PSYCH: pleasant and cooperative, no obvious depression or anxiety  ASSESSMENT AND PLAN:  Discussed the following assessment and plan:  Infection of eyelid - Internal stye but very large and red history setting of heart disease on SBE prophylaxis we'll add doxycycline local compresses very close followup see eye doct  S/P mitral valve repair  Stye, right Close followup is indicated. -Patient advised to return or notify health care team  if symptoms worsen or persist or new concerns arise.  Patient Instructions  Warm compresses at least 3 times a day Add oral antibiotic,  and followup with eye doctor if not a lot better in 48 hours. Or if getting worse.    Neta Mends. Panosh M.D.

## 2013-07-12 NOTE — Patient Instructions (Addendum)
Warm compresses at least 3 times a day Add oral antibiotic,  and followup with eye doctor if not a lot better in 48 hours. Or if getting worse.

## 2013-07-19 ENCOUNTER — Encounter: Payer: Self-pay | Admitting: Internal Medicine

## 2013-07-19 ENCOUNTER — Ambulatory Visit (INDEPENDENT_AMBULATORY_CARE_PROVIDER_SITE_OTHER): Payer: 59 | Admitting: Internal Medicine

## 2013-07-19 VITALS — BP 112/80 | HR 78 | Temp 98.0°F | Ht 69.5 in | Wt 195.0 lb

## 2013-07-19 DIAGNOSIS — Z9889 Other specified postprocedural states: Secondary | ICD-10-CM

## 2013-07-19 DIAGNOSIS — H019 Unspecified inflammation of eyelid: Secondary | ICD-10-CM

## 2013-07-19 DIAGNOSIS — H539 Unspecified visual disturbance: Secondary | ICD-10-CM

## 2013-07-19 DIAGNOSIS — E785 Hyperlipidemia, unspecified: Secondary | ICD-10-CM

## 2013-07-19 DIAGNOSIS — Z23 Encounter for immunization: Secondary | ICD-10-CM

## 2013-07-19 DIAGNOSIS — Z0000 Encounter for general adult medical examination without abnormal findings: Secondary | ICD-10-CM

## 2013-07-19 NOTE — Patient Instructions (Addendum)
Intensify lifestyle intervention physical activity dietary avoidance of animal products processed foods saturated fats. Repeat cholesterol level in about 4-6 months. If it is not improved we may consider changing to Lipitor that is now generic. It might be more potent for cholesterol reduction. Monitor the skin areas I don't think there serious. Make your followup appointment with cardiology and let them be aware of the rapid heart rate that you feel. This is possibly from caffeine .   The intermittent vision changes may be an ophthalmic migraine however it is unusual to start having them in middle age and above. Although caffeine certainly could trigger them and you do have a family history.  Please monitor these and we could consider having neurology opinion as to the cause and management. Calendar these episodes and discuss it at your next visit unless worse in the meantime.

## 2013-07-19 NOTE — Progress Notes (Signed)
Chief Complaint  Patient presents with  . Annual Exam    HPI: Patient comes in today for Preventive Health Care visit . Since his last visit; Eye much better after draining  And antibiotic  Lipids ;no se of med just got [place]  And thinks his lipids could be worse because of the foods eating. Is fairly active in his job although he drives his in and out of his vehicle. No tobacco alcohol but lives off the caffeine. Is due to see cardiology soon has been stable exercise tolerance other some mornings he feels that her heart beats real fast and races. Reports an occasional problem ever since his mitral valve surgery. He states that he has visual episodes of shimmery vision changes like from Roche light flashing usually bilateral vision changes the last up to 15 minutes and then resolve. Not associated with anything specific. Pupils told him it could be migraines without the headache. He has a family history of migraines but he has never had them. No associated nausea vomiting weakness numbness or syncope. Also check a rash on his right thigh started off as itchy bumps now is a flaky spot thinks it's going away and a bump on his left arm. Comes and goes States that some time on his left neck a large vein pops out over near the site of his left neck surgery ROS:  GEN/ HEENT: No fever, significant weight changes sweats headaches vision problems hearing changes, CV/ PULM; No chest pain shortness of breath cough, syncope,edema  change in exercise tolerance. GI /GU: No adominal pain, vomiting, change in bowel habits. No blood in the stool. No significant GU symptoms. SKIN/HEME: ,no acute skin rashes suspicious lesions or bleeding. No lymphadenopathy, nodules, masses.  NEURO/ PSYCH:  No neurologic signs such as weakness numbness. No depression anxiety. IMM/ Allergy: No unusual infections.  Allergy .  States he sleeps like a dead man. REST of 12 system review negative except as per HPI   Past  Medical History  Diagnosis Date  . Allergy   . Hyperlipidemia   . Mitral valve prolapse     a. with severe regurgitation; s/p MV repair 2/10;  b. 07/2011 Echo: EF 50-55%, mild MR.  . Allergic rhinitis   . Patent foramen ovale     a. s/p closure at MV repair in 2010  . Nephrolithiasis     hx of  . History of atrial fibrillation     post op AFib  . CAD (coronary artery disease)     a. cath 12/09: oD1 50%, EF 65%;  b. negative ETT 7/11  . Plantar fasciitis of left foot 07/06/2012   Past Surgical History  Procedure Laterality Date  . Thoracotomy      Rt miniature for mitral valve repair, quadrangular resection of posterior leaftlet with transposition of  native chordae tendineae x 2 and 34mm Edwards Physio II ring annuloplasty  . Patent foramen ovale closure    . Hernia repair      Inguinal 3 years ago  . Left testicular trauma      due to wrestling evaluated Dr Wanda Plump  . Spine surgery  12/09/2010    vertebra 5 & 6     Family History  Problem Relation Age of Onset  . Diabetes Mother   . Hyperlipidemia Mother   . Hyperlipidemia Father   . Heart disease Father     heart surgery 84 ? cabd and valve  . Cancer Daughter 81  ovarian  . Lung cancer Maternal Grandfather     lung  . Emphysema Maternal Grandfather   . Arthritis Paternal Grandmother   . Arthritis Paternal Grandfather   . Rheumatic fever      2 sisters  . Other Sister     twin hx of substance use    History   Social History  . Marital Status: Married    Spouse Name: N/A    Number of Children: N/A  . Years of Education: N/A   Social History Main Topics  . Smoking status: Never Smoker   . Smokeless tobacco: None  . Alcohol Use: No  . Drug Use: No  . Sexually Active:    Other Topics Concern  . 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 aspirin 81 MG EC tablet Take 81 mg by mouth daily.        . simvastatin (ZOCOR) 40 MG tablet Take 1 tablet (40 mg total) by mouth at bedtime.  90 tablet  1  . [DISCONTINUED] doxycycline (VIBRAMYCIN) 100 MG capsule Take 1 capsule (100 mg total) by mouth 2 (two) times daily.  20 capsule  0   No facility-administered encounter medications on file as of 07/19/2013.    EXAM:  BP 112/80  Pulse 78  Temp(Src) 98 F (36.7 C) (Oral)  Ht 5' 9.5" (1.765 m)  Wt 195 lb (88.451 kg)  BMI 28.39 kg/m2  SpO2 97%  Body mass index is 28.39 kg/(m^2).  Physical Exam: Vital signs reviewed JWJ:XBJY is a well-developed well-nourished alert cooperative   male who appears stated age in no acute distress.  HEENT: normocephalic atraumatic , Eyes: PERRL EOM's full, conjunctiva clear, I don't palpate a large lump that was there at the last visit on the right inferiorly at. Minimal redness. Nares: paten,t no deformity discharge or tenderness., Ears: no deformity EAC's clear TMs with normal landmarks. Mouth: clear OP, no lesions, edema.  Moist mucous membranes. Dentition in adequate repair. NECK: supple without masses, thyromegaly or bruits. Well-healed scar left neck I don't see any masses or JVD CHEST/PULM:  Clear to auscultation and percussion breath sounds equal no wheeze , rales or rhonchi. No chest wall deformities or tenderness. Well-healed surgery scars right chest CV: PMI is nondisplaced, S1 S2 no gallops, murmurs, rubs. RRPeripheral pulses are full without delay.No JVD . He does have some slight edema in his feet with some venous varicosities decreased hair a runny ankle feet area ABDOMEN: Bowel sounds normal nontender  No guard or rebound, no  hepato splenomegal no CVA tenderness.  No hernia. Extremtities:  No clubbing cyanosis or edema, no acute joint swelling or redness no focal atrophy NEURO:  Oriented x3, cranial nerves 3-12 appear to be intact, no obvious focal weakness,gait within normal limits no abnormal reflexes or asymmetrical SKIN: normal turgor, color, no bruising or petechiae. Right thigh with a 1 cm superficial flaky patch left antecubital fossa with a pinpoint warty lesion.  PSYCH: Oriented, good eye contact, no obvious depression anxiety, cognition and judgment appear normal. LN: no cervical axillary inguinal adenopathy  Lab Results  Component Value Date   WBC 5.6 07/12/2013   HGB 14.2 07/12/2013   HCT 42.0 07/12/2013   PLT 186.0 07/12/2013   GLUCOSE 88 07/12/2013   CHOL 217* 07/12/2013   TRIG 136.0 07/12/2013   HDL 47.50 07/12/2013   LDLDIRECT 154.7 07/12/2013   LDLCALC 126* 06/28/2012   ALT 23 07/12/2013   AST 16 07/12/2013   NA 139 07/12/2013   K 4.6 07/12/2013   CL 105 07/12/2013   CREATININE 0.9 07/12/2013   BUN 17 07/12/2013   CO2 29 07/12/2013   TSH 1.22 07/12/2013   PSA 0.55 07/12/2013   INR 2.0 05/23/2009   HGBA1C  Value: 5.2 (NOTE)   The ADA recommends the following therapeutic goal for glycemic   control related to Hgb A1C measurement:   Goal of Therapy:   < 7.0% Hgb A1C   Reference: American Diabetes Association: Clinical Practice   Recommendations 2008, Diabetes Care,  2008, 31:(Suppl 1). 01/19/2009    ASSESSMENT AND PLAN:  Discussed the following assessment and plan:  Visit for preventive health examination - tdap today due for colon in 2020  Need for prophylactic vaccination with combined diphtheria-tetanus-pertussis (DTP) vaccine - Plan: Tdap vaccine greater than or equal to 7yo IM  HYPERLIPIDEMIA - Some worsening recently options discussed intensify lifestyle recheck ;consider trying generic atorvastatin  S/P mitral valve repair  Infection of eyelid - Much improved  Visual disturbances  Transient  visual disturbance, bilateral - Sound like ophthalmic migraine but no history of same but positive family history of migraine new onset since heart surgery. Uncertain what to make of the visual changes going on since his mitral valve surgery. We'll calendar them if getting more frequent progressive associated symptoms may consider neurology consult. Reassess at followup in 4-6 months. I suppose high-dose caffeine could be triggering some of this. Patient Care Team: Madelin Headings, MD as PCP - General Rollene Rotunda, MD (Cardiology) Cristi Loron, MD (Neurosurgery) Patient Instructions  Intensify lifestyle intervention physical activity dietary avoidance of animal products processed foods saturated fats. Repeat cholesterol level in about 4-6 months. If it is not improved we may consider changing to Lipitor that is now generic. It might be more potent for cholesterol reduction. Monitor the skin areas I don't think there serious. Make your followup appointment with cardiology and let them be aware of the rapid heart rate that you feel. This is possibly from caffeine .   The intermittent vision changes may be an ophthalmic migraine however it is unusual to start having them in middle age and above. Although caffeine certainly could trigger them and you do have a family history.  Please monitor these and we could consider having neurology opinion as to the cause and management. Calendar these episodes and discuss it at your next visit unless worse in the meantime.     Neta Mends. Panosh M.D. Health Maintenance  Topic Date Due  . Tetanus/tdap  12/22/2012  . Influenza Vaccine  08/22/2013  . Colonoscopy  01/15/2020   Health Maintenance Review

## 2013-09-20 ENCOUNTER — Other Ambulatory Visit: Payer: Self-pay | Admitting: Internal Medicine

## 2013-11-01 ENCOUNTER — Other Ambulatory Visit: Payer: 59

## 2013-11-01 ENCOUNTER — Telehealth: Payer: Self-pay | Admitting: Internal Medicine

## 2013-11-01 NOTE — Telephone Encounter (Signed)
Opened in error

## 2013-11-04 ENCOUNTER — Other Ambulatory Visit (INDEPENDENT_AMBULATORY_CARE_PROVIDER_SITE_OTHER): Payer: 59

## 2013-11-04 DIAGNOSIS — E785 Hyperlipidemia, unspecified: Secondary | ICD-10-CM

## 2013-11-04 LAB — LIPID PANEL
Total CHOL/HDL Ratio: 4
VLDL: 18.6 mg/dL (ref 0.0–40.0)

## 2013-11-08 ENCOUNTER — Encounter: Payer: Self-pay | Admitting: Internal Medicine

## 2013-11-08 ENCOUNTER — Other Ambulatory Visit: Payer: Self-pay | Admitting: *Deleted

## 2013-11-08 ENCOUNTER — Ambulatory Visit (INDEPENDENT_AMBULATORY_CARE_PROVIDER_SITE_OTHER): Payer: 59 | Admitting: Internal Medicine

## 2013-11-08 VITALS — BP 134/84 | HR 74 | Temp 97.6°F | Wt 199.0 lb

## 2013-11-08 DIAGNOSIS — R94111 Abnormal electroretinogram [ERG]: Secondary | ICD-10-CM

## 2013-11-08 DIAGNOSIS — I059 Rheumatic mitral valve disease, unspecified: Secondary | ICD-10-CM

## 2013-11-08 DIAGNOSIS — Z23 Encounter for immunization: Secondary | ICD-10-CM

## 2013-11-08 DIAGNOSIS — E785 Hyperlipidemia, unspecified: Secondary | ICD-10-CM

## 2013-11-08 NOTE — Progress Notes (Signed)
Chief Complaint  Patient presents with  . Follow-up    Has a pain in his left great toe with swelling.  Also has a spot on the middle of his back that he says itches.    HPI: Fu lids  On 40 mg no se noted hesitant to take atorva cause of reputation of se issues.  ichy spot left back ? If rash please check Hx of bunion bothers at times not today related to shoes. Had right  Chest sx in cold air  One day no cpo sob cough otherwise   Hx of  Exposures to chemicals when did  Collision repairs in past .  pfts were done but has some concerns of chemical exposures in the past. Had eye check and has wavy retina they are watching  Vision ok  ROS: See pertinent positives and negatives per HPI.  Past Medical History  Diagnosis Date  . Allergy   . Hyperlipidemia   . Mitral valve prolapse     a. with severe regurgitation; s/p MV repair 2/10;  b. 07/2011 Echo: EF 50-55%, mild MR.  . Allergic rhinitis   . Patent foramen ovale     a. s/p closure at MV repair in 2010  . Nephrolithiasis     hx of  . History of atrial fibrillation     post op AFib  . CAD (coronary artery disease)     a. cath 12/09: oD1 50%, EF 65%;  b. negative ETT 7/11  . Plantar fasciitis of left foot 07/06/2012    Family History  Problem Relation Age of Onset  . Diabetes Mother   . Hyperlipidemia Mother   . Hyperlipidemia Father   . Heart disease Father     heart surgery 84 ? cabd and valve  . Cancer Daughter 23    ovarian  . Lung cancer Maternal Grandfather     lung  . Emphysema Maternal Grandfather   . Arthritis Paternal Grandmother   . Arthritis Paternal Grandfather   . Rheumatic fever      2 sisters  . Other Sister     twin hx of substance use    History   Social History  . Marital Status: Married    Spouse Name: N/A    Number of Children: N/A  . Years of Education: N/A   Social History Main Topics  . Smoking status: Never Smoker   . Smokeless tobacco: None  . Alcohol Use: No  . Drug Use: No  .  Sexual Activity:    Other Topics Concern  . 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 aspirin 81 MG EC tablet Take 81 mg by mouth daily.    . simvastatin (ZOCOR) 40 MG tablet TAKE 1 TABLET AT BEDTIME    EXAM:  BP 134/84  Pulse 74  Temp(Src) 97.6 F (36.4 C) (Oral)  Wt 199 lb (90.266 kg)  SpO2 93%  Body mass index is 28.98 kg/(m^2).  GENERAL: vitals reviewed and listed above, alert, oriented, appears well hydrated and in no acute distress   NECK: no obvious masses on inspection palpation   LUNGS: clear to auscultation bilaterally, no wheezes, rales or rhonchi, good air movement  CV: HRRR, no clubbing cyanosis or  peripheral edema nl cap refill  Skin  No rash  Cherry angioma near area  Left mid thorax . MS: moves all extremities without noticeable focal  abnormality PSYCH: pleasant and cooperative, no obvious depression or anxiety Lab Results  Component Value Date   WBC 5.6 07/12/2013   HGB 14.2 07/12/2013   HCT 42.0 07/12/2013   PLT 186.0 07/12/2013   GLUCOSE 88 07/12/2013   CHOL 186 11/04/2013   TRIG 93.0 11/04/2013   HDL 51.50 11/04/2013   LDLDIRECT 154.7 07/12/2013   LDLCALC 116* 11/04/2013   ALT 23 07/12/2013   AST 16 07/12/2013   NA 139 07/12/2013   K 4.6 07/12/2013   CL 105 07/12/2013   CREATININE 0.9 07/12/2013   BUN 17 07/12/2013   CO2 29 07/12/2013   TSH 1.22 07/12/2013   PSA 0.55 07/12/2013   INR 2.0 05/23/2009   HGBA1C  Value: 5.2 (NOTE)   The ADA recommends the following therapeutic goal for glycemic   control related to Hgb A1C measurement:   Goal of  Therapy:   < 7.0% Hgb A1C   Reference: American Diabetes Association: Clinical Practice   Recommendations 2008, Diabetes Care,  2008, 31:(Suppl 1). 01/19/2009    ASSESSMENT AND PLAN:  Discussed the following assessment and plan:  HYPERLIPIDEMIA - prefers not to take atorva . ILSI and fu.   Nonspecific abnormal retinal function study - right eye vision good  Need for prophylactic vaccination and inoculation against influenza - Plan: Flu Vaccine QUAD 36+ mos PF IM (Fluarix)  -Patient advised to return or notify health care team  if symptoms worsen or persist or new concerns arise. Due for echo 2015  And rov  Per cards  Bu nothing on schedule  Itchy are looks normal poss related to c spine surgery etc  Fu if  persistent or progressive   Patient Instructions  Intensify lifestyle interventions. Consider packing healthy lunch or snack. At this time ok to stay on  Simvastatin .  Will flag cardiology .  About you getting a foillow uyp.   Wellness visit in July 2015      Burna Mortimer K. Panosh M.D.

## 2013-11-08 NOTE — Patient Instructions (Signed)
Intensify lifestyle interventions. Consider packing healthy lunch or snack. At this time ok to stay on  Simvastatin .  Will flag cardiology .  About you getting a foillow uyp.   Wellness visit in July 2015

## 2013-12-12 ENCOUNTER — Encounter: Payer: Self-pay | Admitting: *Deleted

## 2013-12-12 ENCOUNTER — Ambulatory Visit (HOSPITAL_COMMUNITY): Payer: 59 | Admitting: Radiology

## 2013-12-12 ENCOUNTER — Encounter: Payer: Self-pay | Admitting: Cardiovascular Disease

## 2013-12-12 NOTE — Progress Notes (Signed)
Echo was not performed. Echo was ordered for 2015.

## 2013-12-26 ENCOUNTER — Ambulatory Visit: Payer: 59 | Admitting: Cardiology

## 2014-01-09 ENCOUNTER — Other Ambulatory Visit (HOSPITAL_COMMUNITY): Payer: 59

## 2014-01-09 ENCOUNTER — Ambulatory Visit: Payer: 59 | Admitting: Cardiology

## 2014-01-27 ENCOUNTER — Other Ambulatory Visit (HOSPITAL_COMMUNITY): Payer: Self-pay | Admitting: Cardiology

## 2014-01-27 DIAGNOSIS — I059 Rheumatic mitral valve disease, unspecified: Secondary | ICD-10-CM

## 2014-01-30 ENCOUNTER — Ambulatory Visit (HOSPITAL_COMMUNITY): Payer: 59 | Attending: Cardiology | Admitting: Radiology

## 2014-01-30 ENCOUNTER — Encounter: Payer: Self-pay | Admitting: Cardiology

## 2014-01-30 ENCOUNTER — Other Ambulatory Visit (HOSPITAL_COMMUNITY): Payer: 59

## 2014-01-30 ENCOUNTER — Ambulatory Visit (INDEPENDENT_AMBULATORY_CARE_PROVIDER_SITE_OTHER): Payer: 59 | Admitting: Cardiology

## 2014-01-30 VITALS — BP 139/88 | HR 70 | Ht 69.0 in | Wt 199.0 lb

## 2014-01-30 DIAGNOSIS — I079 Rheumatic tricuspid valve disease, unspecified: Secondary | ICD-10-CM | POA: Insufficient documentation

## 2014-01-30 DIAGNOSIS — R42 Dizziness and giddiness: Secondary | ICD-10-CM | POA: Insufficient documentation

## 2014-01-30 DIAGNOSIS — R079 Chest pain, unspecified: Secondary | ICD-10-CM

## 2014-01-30 DIAGNOSIS — I2510 Atherosclerotic heart disease of native coronary artery without angina pectoris: Secondary | ICD-10-CM | POA: Insufficient documentation

## 2014-01-30 DIAGNOSIS — Z9889 Other specified postprocedural states: Secondary | ICD-10-CM

## 2014-01-30 DIAGNOSIS — E785 Hyperlipidemia, unspecified: Secondary | ICD-10-CM | POA: Insufficient documentation

## 2014-01-30 DIAGNOSIS — I059 Rheumatic mitral valve disease, unspecified: Secondary | ICD-10-CM | POA: Insufficient documentation

## 2014-01-30 DIAGNOSIS — I4891 Unspecified atrial fibrillation: Secondary | ICD-10-CM | POA: Insufficient documentation

## 2014-01-30 MED ORDER — SILDENAFIL CITRATE 100 MG PO TABS: 100.0000 mg | ORAL_TABLET | Freq: Every day | ORAL | Status: DC | PRN

## 2014-01-30 NOTE — Progress Notes (Signed)
Echocardiogram performed.  

## 2014-01-30 NOTE — Patient Instructions (Signed)
The current medical regimen is effective;  continue present plan and medications.  Follow up in 1 year with Dr Hochrein.  You will receive a letter in the mail 2 months before you are due.  Please call us when you receive this letter to schedule your follow up appointment.  

## 2014-01-30 NOTE — Progress Notes (Signed)
   HPI The patient presents for followup of mitral valve repair. Since he was last seen he has had no further presyncope or palpitations which was a problem previously. He's not having any shortness of breath, PND or orthopnea. He's not having any chest pressure, neck or arm discomfort. However, he does get discomfort under his bilateral ribs. This seems to happen when he is seated for a while. He might need to stretch and he gets better. It doesn't happen with activities and he is quite an active person. He has a vigorous job which requires lifting. He said this discomfort did exist a more his valve surgery but then went away   No Known Allergies  Current Outpatient Prescriptions  Medication Sig Dispense Refill  . aspirin 81 MG EC tablet Take 81 mg by mouth daily.        . simvastatin (ZOCOR) 40 MG tablet TAKE 1 TABLET AT BEDTIME  90 tablet  2   No current facility-administered medications for this visit.    Past Medical History  Diagnosis Date  . Allergy   . Hyperlipidemia   . Mitral valve prolapse     a. with severe regurgitation; s/p MV repair 2/10;  b. 07/2011 Echo: EF 50-55%, mild MR.  . Allergic rhinitis   . Patent foramen ovale     a. s/p closure at MV repair in 2010  . Nephrolithiasis     hx of  . History of atrial fibrillation     post op AFib  . CAD (coronary artery disease)     a. cath 12/09: oD1 50%, EF 65%;  b. negative ETT 7/11  . Plantar fasciitis of left foot 07/06/2012    Past Surgical History  Procedure Laterality Date  . Thoracotomy      Rt miniature for mitral valve repair, quadrangular resection of posterior leaftlet with transposition of  native chordae tendineae x 2 and 73mm Edwards Physio II ring annuloplasty  . Patent foramen ovale closure    . Hernia repair      Inguinal 3 years ago  . Left testicular trauma      due to wrestling evaluated Dr Reece Agar  . Spine surgery  12/09/2010    vertebra 5 & 6    ROS:  As stated in the HPI and negative for  all other systems.  PHYSICAL EXAM There were no vitals taken for this visit. GENERAL:  Well appearing NECK:  No jugular venous distention, waveform within normal limits, carotid upstroke brisk and symmetric, no bruits, no thyromegaly LYMPHATICS:  No cervical, inguinal adenopathy LUNGS:  Clear to auscultation bilaterally BACK:  No CVA tenderness CHEST:  Well healed surgical scar HEART:  PMI not displaced or sustained,S1 and S2 within normal limits, no S3, no S4, no clicks, no rubs, no murmurs ABD:  Flat, positive bowel sounds normal in frequency in pitch, no bruits, no rebound, no guarding, no midline pulsatile mass, no hepatomegaly, no splenomegaly EXT:  2 plus pulses throughout, no edema, no cyanosis no clubbing    ASSESSMENT AND PLAN  Mitral Valve Repair -  I reviewed the echo today myself and his valve repair looks stable.  There is mild regurgitation.   No further imaging or change in therapy is planned.  Chest pain- I do not think that this is cardiac but probably skeletal or costochondral. No change in therapy is indicated.  ED - I will give him samples of Viagra.  He has no contraindication

## 2014-07-17 ENCOUNTER — Other Ambulatory Visit (INDEPENDENT_AMBULATORY_CARE_PROVIDER_SITE_OTHER): Payer: 59

## 2014-07-17 DIAGNOSIS — Z0000 Encounter for general adult medical examination without abnormal findings: Secondary | ICD-10-CM

## 2014-07-17 LAB — LIPID PANEL
Cholesterol: 173 mg/dL (ref 0–200)
HDL: 45.6 mg/dL (ref >39.00)
LDL Cholesterol: 113 mg/dL — ABNORMAL HIGH (ref 0–99)
NonHDL: 127.4
Total CHOL/HDL Ratio: 4
Triglycerides: 72 mg/dL (ref 0.0–149.0)
VLDL: 14.4 mg/dL (ref 0.0–40.0)

## 2014-07-17 LAB — BASIC METABOLIC PANEL
BUN: 20 mg/dL (ref 6–23)
CHLORIDE: 105 meq/L (ref 96–112)
CO2: 29 meq/L (ref 19–32)
CREATININE: 0.9 mg/dL (ref 0.4–1.5)
Calcium: 9.1 mg/dL (ref 8.4–10.5)
GFR: 94.67 mL/min (ref >60.00)
Glucose, Bld: 92 mg/dL (ref 70–99)
POTASSIUM: 4.8 meq/L (ref 3.5–5.1)
Sodium: 138 mEq/L (ref 135–145)

## 2014-07-17 LAB — CBC WITH DIFFERENTIAL/PLATELET
BASOS PCT: 0.5 % (ref 0.0–3.0)
Basophils Absolute: 0 10*3/uL (ref 0.0–0.1)
EOS ABS: 0.2 10*3/uL (ref 0.0–0.7)
EOS PCT: 4.4 % (ref 0.0–5.0)
HEMATOCRIT: 41.1 % (ref 39.0–52.0)
Hemoglobin: 13.8 g/dL (ref 13.0–17.0)
LYMPHS ABS: 2.2 10*3/uL (ref 0.7–4.0)
Lymphocytes Relative: 47.8 % — ABNORMAL HIGH (ref 12.0–46.0)
MCHC: 33.5 g/dL (ref 30.0–36.0)
MCV: 89.2 fl (ref 78.0–100.0)
MONO ABS: 0.3 10*3/uL (ref 0.1–1.0)
Monocytes Relative: 6.3 % (ref 3.0–12.0)
NEUTROS PCT: 41 % — AB (ref 43.0–77.0)
Neutro Abs: 1.9 10*3/uL (ref 1.4–7.7)
Platelets: 180 10*3/uL (ref 150.0–400.0)
RBC: 4.6 Mil/uL (ref 4.22–5.81)
RDW: 13.2 % (ref 11.5–15.5)
WBC: 4.7 10*3/uL (ref 4.0–10.5)

## 2014-07-17 LAB — TSH: TSH: 3.08 u[IU]/mL (ref 0.35–4.50)

## 2014-07-17 LAB — HEPATIC FUNCTION PANEL
ALT: 26 U/L (ref 0–53)
AST: 19 U/L (ref 0–37)
Albumin: 3.7 g/dL (ref 3.5–5.2)
Alkaline Phosphatase: 62 U/L (ref 39–117)
BILIRUBIN DIRECT: 0.1 mg/dL (ref 0.0–0.3)
TOTAL PROTEIN: 6.2 g/dL (ref 6.0–8.3)
Total Bilirubin: 0.5 mg/dL (ref 0.2–1.2)

## 2014-07-17 LAB — PSA: PSA: 0.68 ng/mL (ref 0.10–4.00)

## 2014-07-24 ENCOUNTER — Ambulatory Visit (INDEPENDENT_AMBULATORY_CARE_PROVIDER_SITE_OTHER): Payer: 59 | Admitting: Internal Medicine

## 2014-07-24 ENCOUNTER — Encounter: Payer: Self-pay | Admitting: Internal Medicine

## 2014-07-24 VITALS — BP 120/84 | Temp 98.2°F | Ht 69.5 in | Wt 195.0 lb

## 2014-07-24 DIAGNOSIS — E785 Hyperlipidemia, unspecified: Secondary | ICD-10-CM

## 2014-07-24 DIAGNOSIS — Z0000 Encounter for general adult medical examination without abnormal findings: Secondary | ICD-10-CM

## 2014-07-24 DIAGNOSIS — Z9889 Other specified postprocedural states: Secondary | ICD-10-CM

## 2014-07-24 DIAGNOSIS — Z860100 Personal history of colon polyps, unspecified: Secondary | ICD-10-CM | POA: Insufficient documentation

## 2014-07-24 NOTE — Patient Instructions (Addendum)
  Call about when colonoscopy is due but also will send flag about when  You are due. Dr Deatra Ina  [facility] .  Exam is good today.  Healthy lifestyle includes : At least 150 minutes of exercise weeks  , weight at healthy levels, which is usually   BMI 19-25. Avoid trans fats and processed foods;  Increase fresh fruits and veges to 5 servings per day. And avoid sweet beverages including tea and juice. Mediterranean diet with olive oil and nuts have been noted to be heart and brain healthy . Avoid tobacco products . Limit  alcohol to  7 per week for women and 14 servings for men.  Get adequate sleep . Wellness visit and  Labs in 1 year or prn.   BP Readings from Last 3 Encounters:  07/24/14 120/84  01/30/14 139/88  11/08/13 134/84   Wt Readings from Last 3 Encounters:  07/24/14 195 lb (88.451 kg)  01/30/14 199 lb (90.266 kg)  11/08/13 199 lb (90.266 kg)

## 2014-07-24 NOTE — Progress Notes (Signed)
Pre visit review using our clinic review tool, if applicable. No additional management support is needed unless otherwise documented below in the visit note.  Chief Complaint  Patient presents with  . Annual Exam    HPI: Patient comes in today for Preventive Health Care visit  No change in health no injury  Was given viagra  Per cards. Not using  One episode  Turned around quick and got lgiht headed .sees cards stable no sx.  Eye :  Cataract developiung and right hand arthritis  .  Health Maintenance  Topic Date Due  . Influenza Vaccine  07/22/2014  . Colonoscopy  01/15/2020  . Tetanus/tdap  07/20/2023   Health Maintenance Review LIFESTYLE:  Exercise:  Active  Tobacco/ETS: Alcohol: per day  ocass red wine  Sugar beverages: pepsi .  Sleep: ok  Drug use: no Colonoscopy: ? Due .   Had polyps  Last was in 2011.  ROS:  GEN/ HEENT: No fever, significant weight changes sweats headaches vision problems  Early hearing changes, CV/ PULM; No chest pain shortness of breath cough, syncope,edema  change in exercise tolerance. GI /GU: No adominal pain, vomiting, change in bowel habits. No blood in the stool. No significant GU symptoms. SKIN/HEME: ,no acute skin rashes suspicious lesions or bleeding. No lymphadenopathy, nodules, masses.  Fingers sore  At times   Itchy area on back when gets hot no other lesion NEURO/ PSYCH:  No neurologic signs such as weakness numbness. No depression anxiety. Lots of stress .   Trigger fingers sometimes  IMM/ Allergy: No unusual infections.  Allergy .   REST of 12 system review negative except as per HPI   Past Medical History  Diagnosis Date  . Allergy   . Hyperlipidemia   . Mitral valve prolapse     a. with severe regurgitation; s/p MV repair 2/10;  b. 07/2011 Echo: EF 50-55%, mild MR.  . Allergic rhinitis   . Patent foramen ovale     a. s/p closure at MV repair in 2010  . Nephrolithiasis     hx of  . History of atrial fibrillation     post op  AFib  . CAD (coronary artery disease)     a. cath 12/09: oD1 50%, EF 65%;  b. negative ETT 7/11  . Plantar fasciitis of left foot 07/06/2012  . Atrial fibrillation 02/10/2009    Centricity Description: FIBRILLATION, ATRIAL Qualifier: Diagnosis of  By: Percival Spanish, MD, Farrel Gordon   Centricity Description: ATRIAL FIBRILLATION Qualifier: Diagnosis of  By: Sarajane Jews MD, Ishmael Holter   . Hx of colonic polyps     Family History  Problem Relation Age of Onset  . Diabetes Mother   . Hyperlipidemia Mother   . Hyperlipidemia Father   . Heart disease Father     heart surgery 84 ? cabd and valve  . Cancer Daughter 68    ovarian  . Lung cancer Maternal Grandfather     lung  . Emphysema Maternal Grandfather   . Arthritis Paternal Grandmother   . Arthritis Paternal Grandfather   . Rheumatic fever      2 sisters  . Other Sister     twin hx of substance use    History   Social History  . Marital Status: Married    Spouse Name: N/A    Number of Children: N/A  . Years of Education: N/A   Social History Main Topics  . Smoking status: Never Smoker   . Smokeless tobacco: Never Used  .  Alcohol Use: No  . Drug Use: No  . Sexual Activity: None   Other Topics Concern  . 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 aspirin 81 MG EC tablet Take 81 mg by mouth daily.    . simvastatin (ZOCOR) 40 MG tablet TAKE 1 TABLET AT BEDTIME  . sildenafil (VIAGRA) 100 MG tablet Take 1 tablet (100 mg total) by mouth daily as needed for erectile dysfunction.    EXAM:  BP 120/84  Temp(Src) 98.2 F (36.8 C)  (Oral)  Ht 5' 9.5" (1.765 m)  Wt 195 lb (88.451 kg)  BMI 28.39 kg/m2  Body mass index is 28.39 kg/(m^2).  Physical Exam: Vital signs reviewed EXH:BZJI is a well-developed well-nourished alert cooperative    who appearsr stated age in no acute distress.  HEENT: normocephalic atraumatic , Eyes: PERRL EOM's full, conjunctiva clear, Nares: paten,t no deformity discharge or tenderness., Ears: no deformity EAC's clear TMs with normal landmarks. Mouth: clear OP, no lesions, edema.  Moist mucous membranes. Dentition in adequate repair. NECK: supple without masses, thyromegaly or bruits. CHEST/PULM:  Clear to auscultation and percussion breath sounds equal no wheeze , rales or rhonchi. No chest wall deformities or tenderness. CV: PMI is nondisplaced,  rr S1 S2 no gallops, ? Wispy short murmur lusb  nomurmurs, rubs. Peripheral pulses are full without delay.No JVD .  ABDOMEN: Bowel sounds normal nontender  No guard or rebound, no hepato splenomegal no CVA tenderness.  No hernia. Extremtities:  No clubbing cyanosis or edema, no acute joint swelling or redness no focal atrophy no acute joint swelling  NEURO:  Oriented x3, cranial nerves 3-12 appear to be intact, no obvious focal weakness,gait within normal limits no abnormal reflexes or asymmetrical Rectal prostate 1+ no nodules.  Heme neg. [employer] Shark .  SKIN: No acute rashes normal turgor, color, no bruising or petechiae. onychomycosis great toenail.  PSYCH: Oriented, good eye contact, no obvious depression anxiety, cognition and judgment appear normal. LN: no cervical axillary inguinal adenopathy  Lab Results  Component Value Date   WBC 4.7 07/17/2014   HGB 13.8 07/17/2014   HCT 41.1 07/17/2014   PLT 180.0 07/17/2014   GLUCOSE 92 07/17/2014   CHOL 173 07/17/2014   TRIG 72.0 07/17/2014   HDL 45.60 07/17/2014   LDLDIRECT 154.7 07/12/2013   LDLCALC 113* 07/17/2014   ALT 26 07/17/2014   AST 19 07/17/2014   NA 138 07/17/2014   K 4.8 07/17/2014   CL 105 07/17/2014     CREATININE 0.9 07/17/2014   BUN 20 07/17/2014   CO2 29 07/17/2014   TSH 3.08 07/17/2014   PSA 0.68 07/17/2014   INR 2.0 05/23/2009   HGBA1C  Value: 5.2 (NOTE)   The ADA recommends the following therapeutic goal for glycemic   control related to Hgb A1C measurement:   Goal of Therapy:   < 7.0% Hgb A1C   Reference: American Diabetes Association: Clinical Practice   Recommendations 2008, Diabetes Care,  2008, 31:(Suppl 1). 01/19/2009    ASSESSMENT AND PLAN:  Discussed the following assessment and plan:  Visit for preventive health examination - cont lsi avoid sodas etc  with hx of elevated bg readings   HYPERLIPIDEMIA  S/P mitral valve repair  Hx of colonic polyps Counseled regarding healthy nutrition, exercise, sleep, injury prevention, calcium vit d and healthy weight .  Patient Care Team: Burnis Medin, MD as PCP - General Minus Breeding, MD (Cardiology) Ophelia Charter, MD (Neurosurgery) Patient Instructions    Call about when colonoscopy is due but also will send flag about when  You are due. Dr Deatra Ina  [facility] [facility] .  Exam is good today.  Healthy lifestyle includes : At least 150 minutes of exercise weeks  , weight at healthy levels, which is usually   BMI 19-25. Avoid trans fats and processed foods;  Increase fresh fruits and veges to 5 servings per day. And avoid sweet beverages including tea and juice. Mediterranean diet with olive oil and nuts have been noted to be heart and brain healthy . Avoid tobacco products . Limit  alcohol to  7 per week for women and 14 servings for men.  Get adequate sleep . Wellness visit and  Labs in 1 year or prn.   BP Readings from Last 3 Encounters:  07/24/14 120/84  01/30/14 139/88  11/08/13 134/84   Wt Readings from Last 3 Encounters:  07/24/14 195 lb (88.451 kg)  01/30/14 199 lb (90.266 kg)  11/08/13 199 lb (90.266 kg)       Jeremy Brooking. Jeremy Wolf M.D.

## 2014-09-08 ENCOUNTER — Other Ambulatory Visit: Payer: Self-pay | Admitting: Internal Medicine

## 2014-09-08 NOTE — Telephone Encounter (Signed)
Sent to the pharmacy by e-scribe. 

## 2014-09-16 ENCOUNTER — Encounter: Payer: Self-pay | Admitting: Gastroenterology

## 2014-09-22 ENCOUNTER — Encounter: Payer: Self-pay | Admitting: Gastroenterology

## 2014-09-29 ENCOUNTER — Encounter: Payer: Self-pay | Admitting: Gastroenterology

## 2014-10-23 ENCOUNTER — Ambulatory Visit (AMBULATORY_SURGERY_CENTER): Payer: Self-pay | Admitting: *Deleted

## 2014-10-23 VITALS — Ht 69.5 in | Wt 196.6 lb

## 2014-10-23 DIAGNOSIS — Z86010 Personal history of colonic polyps: Secondary | ICD-10-CM

## 2014-10-23 MED ORDER — NA SULFATE-K SULFATE-MG SULF 17.5-3.13-1.6 GM/177ML PO SOLN: ORAL | Status: DC

## 2014-10-23 NOTE — Progress Notes (Signed)
No allergies to eggs or soy. No problems with anesthesia.  Pt given Emmi instructions for colonoscopy  No oxygen use  No diet drug use  

## 2014-11-07 ENCOUNTER — Ambulatory Visit (AMBULATORY_SURGERY_CENTER): Payer: 59 | Admitting: Gastroenterology

## 2014-11-07 ENCOUNTER — Encounter: Payer: Self-pay | Admitting: Gastroenterology

## 2014-11-07 VITALS — BP 111/79 | HR 68 | Temp 96.0°F | Resp 14 | Ht 69.5 in | Wt 196.0 lb

## 2014-11-07 DIAGNOSIS — K621 Rectal polyp: Secondary | ICD-10-CM

## 2014-11-07 DIAGNOSIS — D129 Benign neoplasm of anus and anal canal: Secondary | ICD-10-CM

## 2014-11-07 DIAGNOSIS — Z86010 Personal history of colonic polyps: Secondary | ICD-10-CM

## 2014-11-07 DIAGNOSIS — D128 Benign neoplasm of rectum: Secondary | ICD-10-CM

## 2014-11-07 MED ORDER — SODIUM CHLORIDE 0.9 % IV SOLN: 500.0000 mL | INTRAVENOUS | Status: DC

## 2014-11-07 NOTE — Op Note (Signed)
[place]  [address]. [place], [id number]   COLONOSCOPY PROCEDURE REPORT  PATIENT: [name], [name]  MR#: [id number] BIRTHDATE: 1956/03/04 , 57  yrs. old GENDER: male ENDOSCOPIST: Inda Castle, MD REFERRED EN:IDPOE Darnelle Going, M.D. PROCEDURE DATE:  11/07/2014 PROCEDURE:   Colonoscopy with snare polypectomy First Screening Colonoscopy - Avg.  risk and is 50 yrs.  old or older - No.  Prior Negative Screening - Now for repeat screening. N/A  History of Adenoma - Now for follow-up colonoscopy & has been > or = to 3 yrs.  Yes hx of adenoma.  Has been 3 or more years since last colonoscopy.  Polyps Removed Today? Yes. ASA CLASS:   Class II INDICATIONS:high risk personal history of colonic polyps.2010 MEDICATIONS: Monitored anesthesia care and Propofol 250 mg IV  DESCRIPTION OF PROCEDURE:   After the risks benefits and alternatives of the procedure were thoroughly explained, informed consent was obtained.  The digital rectal exam revealed no abnormalities of the rectum.   The LB [id number]  endoscope was introduced through the anus and advanced to the cecum, which was identified by both the appendix and ileocecal valve. No adverse events experienced.   The quality of the prep was excellent using Suprep  The instrument was then slowly withdrawn as the colon was fully examined.      COLON FINDINGS: A flat polyp measuring 3 mm in size was found in the rectum.  A polypectomy was performed with a cold snare.  The resection was complete, the polyp tissue was completely retrieved and sent to histology.   The examination was otherwise normal. Retroflexed views revealed no abnormalities. The time to cecum=2 minutes 20 seconds.  Withdrawal time=7 minutes 18 seconds.  The scope was withdrawn and the procedure completed. COMPLICATIONS: There were no immediate complications.  ENDOSCOPIC IMPRESSION: 1.   Flat polyp was found in the rectum; polypectomy was  performed with a cold snare 2.   The examination was otherwise normal  RECOMMENDATIONS: If the polyp(s) removed today are proven to be adenomatous (pre-cancerous) polyps, you will need a repeat colonoscopy in 5 years.  Otherwise you should continue to follow colorectal cancer screening guidelines for "routine risk" patients with colonoscopy in 10 years.  You will receive a letter within 1-2 weeks with the results of your biopsy as well as final recommendations.  Please call my office if you have not received a letter after 3 weeks.  eSigned:  Inda Castle, MD 11/07/2014 9:34 AM   cc:   PATIENT NAME:  [name], [name] MR#: [id number]

## 2014-11-07 NOTE — Progress Notes (Signed)
No problems noted in the recovery room. maw 

## 2014-11-07 NOTE — Progress Notes (Signed)
Patient awakening,vss,report to rn 

## 2014-11-07 NOTE — Progress Notes (Signed)
Called to room to assist during endoscopic procedure.  Patient ID and intended procedure confirmed with present staff. Received instructions for my participation in the procedure from the performing physician.  

## 2014-11-07 NOTE — Patient Instructions (Signed)

## 2014-11-08 ENCOUNTER — Telehealth: Payer: Self-pay

## 2014-11-08 NOTE — Telephone Encounter (Signed)
  Follow up Call-  Call back number 11/07/2014  Post procedure Call Back phone  # [phone]  Permission to leave phone message Yes     Patient questions:  Do you have a fever, pain , or abdominal swelling? No. Pain Score  0 *  Have you tolerated food without any problems? Yes.    Have you been able to return to your normal activities? Yes.    Do you have any questions about your discharge instructions: Diet   No. Medications  No. Follow up visit  No.  Do you have questions or concerns about your Care? No.  Actions: * If pain score is 4 or above: No action needed, pain <4.

## 2014-11-13 ENCOUNTER — Encounter: Payer: Self-pay | Admitting: Gastroenterology

## 2014-12-01 ENCOUNTER — Encounter: Payer: 59 | Admitting: Gastroenterology

## 2014-12-08 ENCOUNTER — Encounter: Payer: 59 | Admitting: Gastroenterology

## 2015-05-18 ENCOUNTER — Encounter: Payer: Self-pay | Admitting: Family Medicine

## 2015-05-18 ENCOUNTER — Ambulatory Visit (INDEPENDENT_AMBULATORY_CARE_PROVIDER_SITE_OTHER): Payer: 59 | Admitting: Family Medicine

## 2015-05-18 VITALS — BP 120/70 | HR 80 | Temp 98.2°F | Ht 69.5 in | Wt 195.0 lb

## 2015-05-18 DIAGNOSIS — J209 Acute bronchitis, unspecified: Secondary | ICD-10-CM | POA: Diagnosis not present

## 2015-05-18 MED ORDER — HYDROCODONE-HOMATROPINE 5-1.5 MG/5ML PO SYRP: 5.0000 mL | ORAL_SOLUTION | ORAL | Status: DC | PRN

## 2015-05-18 MED ORDER — AZITHROMYCIN 250 MG PO TABS: ORAL_TABLET | ORAL | Status: DC

## 2015-05-18 NOTE — Progress Notes (Signed)
Pre visit review using our clinic review tool, if applicable. No additional management support is needed unless otherwise documented below in the visit note. 

## 2015-05-18 NOTE — Progress Notes (Signed)
   Subjective:    Patient ID: Jeremy Wolf, male    DOB: 04-May-1956, 59 y.o.   MRN: [id number]  HPI Here for one week of PND, chest tightness and coughing up green sputum. No fever or chest pain. On Mucinex.   Review of Systems  Constitutional: Negative.   HENT: Positive for congestion and postnasal drip. Negative for sinus pressure.   Eyes: Negative.   Respiratory: Positive for cough, chest tightness and wheezing.   Cardiovascular: Negative.        Objective:   Physical Exam  Constitutional: He appears well-developed and well-nourished.  HENT:  Right Ear: External ear normal.  Left Ear: External ear normal.  Nose: Nose normal.  Mouth/Throat: Oropharynx is clear and moist.  Eyes: Conjunctivae are normal.  Pulmonary/Chest: Effort normal. No respiratory distress. He has no rales.  Diffuse rhonchi and wheezes   Lymphadenopathy:    He has no cervical adenopathy.          Assessment & Plan:  Bronchitis. Treat with a Zpack.

## 2015-05-28 ENCOUNTER — Telehealth: Payer: Self-pay | Admitting: Internal Medicine

## 2015-05-28 ENCOUNTER — Encounter (INDEPENDENT_AMBULATORY_CARE_PROVIDER_SITE_OTHER): Payer: 59 | Admitting: Ophthalmology

## 2015-05-28 DIAGNOSIS — H2513 Age-related nuclear cataract, bilateral: Secondary | ICD-10-CM

## 2015-05-28 DIAGNOSIS — H20012 Primary iridocyclitis, left eye: Secondary | ICD-10-CM

## 2015-05-28 DIAGNOSIS — H35371 Puckering of macula, right eye: Secondary | ICD-10-CM

## 2015-05-28 DIAGNOSIS — H31002 Unspecified chorioretinal scars, left eye: Secondary | ICD-10-CM

## 2015-05-28 NOTE — Telephone Encounter (Signed)
Pt has an order from [facility]) to have blood work drawn. Can I sch? Pt will bring order

## 2015-05-28 NOTE — Telephone Encounter (Signed)
Need  diagnosis code  And an order from that MD  With contact information etc

## 2015-05-28 NOTE — Telephone Encounter (Signed)
Patient's wife states ICD 10 code is on lab order, H31.001 and patient will bring order in for lab appointment.  He is scheduled for 05/29/15.

## 2015-05-28 NOTE — Telephone Encounter (Signed)
lmom for pt wife to callback

## 2015-05-29 ENCOUNTER — Other Ambulatory Visit: Payer: 59

## 2015-06-02 ENCOUNTER — Encounter: Payer: Self-pay | Admitting: Family Medicine

## 2015-06-02 ENCOUNTER — Ambulatory Visit (INDEPENDENT_AMBULATORY_CARE_PROVIDER_SITE_OTHER): Payer: 59 | Admitting: Family Medicine

## 2015-06-02 VITALS — BP 159/90 | HR 83 | Temp 98.0°F | Resp 20 | Ht 69.5 in | Wt 197.5 lb

## 2015-06-02 DIAGNOSIS — J0110 Acute frontal sinusitis, unspecified: Secondary | ICD-10-CM

## 2015-06-02 MED ORDER — AMOXICILLIN-POT CLAVULANATE 875-125 MG PO TABS: 1.0000 | ORAL_TABLET | Freq: Two times a day (BID) | ORAL | Status: DC

## 2015-06-02 NOTE — Progress Notes (Signed)
   Subjective:    Patient ID: Jeremy Wolf, male    DOB: 1956/04/28, 59 y.o.   MRN: [id number]  HPI Patient seen for acute visit. He was seen recently for sinusitis/bronchitis symptoms and treated with Zithromax. He states he feels like his never fully recovered. Reports over 2 week history of productive cough and left frontal sinus headache. Some nasal congestion. He's also taking plain Mucinex. Cough is productive. No fever. No known drug allergies. Nonsmoker.  Past Medical History  Diagnosis Date  . Allergy   . Hyperlipidemia   . Mitral valve prolapse     a. with severe regurgitation; s/p MV repair 2/10;  b. 07/2011 Echo: EF 50-55%, mild MR.  . Allergic rhinitis   . Patent foramen ovale     a. s/p closure at MV repair in 2010  . Nephrolithiasis     hx of  . History of atrial fibrillation     post op AFib  . CAD (coronary artery disease)     a. cath 12/09: oD1 50%, EF 65%;  b. negative ETT 7/11  . Plantar fasciitis of left foot 07/06/2012  . Atrial fibrillation 02/10/2009    Centricity Description: FIBRILLATION, ATRIAL Qualifier: Diagnosis of  By: Percival Spanish, MD, Farrel Gordon   Centricity Description: ATRIAL FIBRILLATION Qualifier: Diagnosis of  By: Sarajane Jews MD, Ishmael Holter   . Hx of colonic polyps   . Cataract   . Blood transfusion without reported diagnosis 2010    after heart surgery  . Sleep apnea    Past Surgical History  Procedure Laterality Date  . Thoracotomy  2010    Rt miniature for mitral valve repair, quadrangular resection of posterior leaftlet with transposition of  native chordae tendineae x 2 and 2mm Edwards Physio II ring annuloplasty  . Patent foramen ovale closure  2010  . Hernia repair Right 2012    Inguinal 3 years ago  . Spine surgery  12/09/2010    vertebra 5 & 6    reports that he has never smoked. He has never used smokeless tobacco. He reports that he drinks alcohol. He reports that he does not use illicit drugs. family history includes Arthritis in his  paternal grandfather and paternal grandmother; Cancer (age of onset: 26) in his daughter; Diabetes in his mother; Emphysema in his maternal grandfather; Heart disease in his father; Hyperlipidemia in his father and mother; Lung cancer in his maternal grandfather; Other in his sister; Rheumatic fever in an other family member. There is no history of Colon cancer. No Known Allergies    Review of Systems  Constitutional: Positive for fatigue. Negative for fever and chills.  HENT: Positive for congestion.   Respiratory: Positive for cough.   Neurological: Positive for headaches.       Objective:   Physical Exam  Constitutional: He appears well-developed and well-nourished.  HENT:  Right Ear: External ear normal.  Left Ear: External ear normal.  Mouth/Throat: Oropharynx is clear and moist.  Neck: Neck supple.  Cardiovascular: Normal rate and regular rhythm.   Pulmonary/Chest: Effort normal and breath sounds normal. No respiratory distress. He has no wheezes. He has no rales.  Lymphadenopathy:    He has no cervical adenopathy.          Assessment & Plan:  Probable acute versus chronic left frontal sinusitis. Cover for anaerobes with Augmentin 875 mg twice daily for 10 days. Touch base with primary if not resolving in 2 weeks

## 2015-06-02 NOTE — Patient Instructions (Signed)

## 2015-06-02 NOTE — Progress Notes (Signed)
Pre visit review using our clinic review tool, if applicable. No additional management support is needed unless otherwise documented below in the visit note. 

## 2015-07-23 ENCOUNTER — Other Ambulatory Visit (INDEPENDENT_AMBULATORY_CARE_PROVIDER_SITE_OTHER): Payer: 59

## 2015-07-23 DIAGNOSIS — Z0000 Encounter for general adult medical examination without abnormal findings: Secondary | ICD-10-CM

## 2015-07-23 LAB — BASIC METABOLIC PANEL
BUN: 15 mg/dL (ref 6–23)
CO2: 27 meq/L (ref 19–32)
CREATININE: 0.91 mg/dL (ref 0.40–1.50)
Calcium: 9.2 mg/dL (ref 8.4–10.5)
Chloride: 107 mEq/L (ref 96–112)
GFR: 90.75 mL/min (ref >60.00)
Glucose, Bld: 96 mg/dL (ref 70–99)
POTASSIUM: 5 meq/L (ref 3.5–5.1)
SODIUM: 142 meq/L (ref 135–145)

## 2015-07-23 LAB — LIPID PANEL
CHOLESTEROL: 174 mg/dL (ref 0–200)
HDL: 51.8 mg/dL (ref >39.00)
LDL CALC: 104 mg/dL — AB (ref 0–99)
NonHDL: 122.24
TRIGLYCERIDES: 89 mg/dL (ref 0.0–149.0)
Total CHOL/HDL Ratio: 3
VLDL: 17.8 mg/dL (ref 0.0–40.0)

## 2015-07-23 LAB — CBC WITH DIFFERENTIAL/PLATELET
Basophils Absolute: 0 10*3/uL (ref 0.0–0.1)
Basophils Relative: 0.5 % (ref 0.0–3.0)
Eosinophils Absolute: 0.2 10*3/uL (ref 0.0–0.7)
Eosinophils Relative: 3.1 % (ref 0.0–5.0)
HCT: 40.8 % (ref 39.0–52.0)
Hemoglobin: 13.7 g/dL (ref 13.0–17.0)
LYMPHS ABS: 2.5 10*3/uL (ref 0.7–4.0)
LYMPHS PCT: 43.7 % (ref 12.0–46.0)
MCHC: 33.6 g/dL (ref 30.0–36.0)
MCV: 88.5 fl (ref 78.0–100.0)
Monocytes Absolute: 0.3 10*3/uL (ref 0.1–1.0)
Monocytes Relative: 6.1 % (ref 3.0–12.0)
Neutro Abs: 2.7 10*3/uL (ref 1.4–7.7)
Neutrophils Relative %: 46.6 % (ref 43.0–77.0)
Platelets: 191 10*3/uL (ref 150.0–400.0)
RBC: 4.62 Mil/uL (ref 4.22–5.81)
RDW: 13.6 % (ref 11.5–15.5)
WBC: 5.7 10*3/uL (ref 4.0–10.5)

## 2015-07-23 LAB — TSH: TSH: 2.35 u[IU]/mL (ref 0.35–4.50)

## 2015-07-23 LAB — HEPATIC FUNCTION PANEL
ALBUMIN: 4 g/dL (ref 3.5–5.2)
ALK PHOS: 64 U/L (ref 39–117)
ALT: 25 U/L (ref 0–53)
AST: 17 U/L (ref 0–37)
Bilirubin, Direct: 0.1 mg/dL (ref 0.0–0.3)
TOTAL PROTEIN: 6.3 g/dL (ref 6.0–8.3)
Total Bilirubin: 0.4 mg/dL (ref 0.2–1.2)

## 2015-07-23 LAB — PSA: PSA: 0.57 ng/mL (ref 0.10–4.00)

## 2015-07-30 ENCOUNTER — Encounter: Payer: 59 | Admitting: Internal Medicine

## 2015-08-13 ENCOUNTER — Ambulatory Visit (INDEPENDENT_AMBULATORY_CARE_PROVIDER_SITE_OTHER): Payer: 59 | Admitting: Internal Medicine

## 2015-08-13 ENCOUNTER — Encounter: Payer: Self-pay | Admitting: Internal Medicine

## 2015-08-13 VITALS — BP 116/80 | Temp 98.2°F | Ht 69.5 in | Wt 194.3 lb

## 2015-08-13 DIAGNOSIS — R079 Chest pain, unspecified: Secondary | ICD-10-CM | POA: Diagnosis not present

## 2015-08-13 DIAGNOSIS — H547 Unspecified visual loss: Secondary | ICD-10-CM

## 2015-08-13 DIAGNOSIS — E785 Hyperlipidemia, unspecified: Secondary | ICD-10-CM

## 2015-08-13 DIAGNOSIS — Z9889 Other specified postprocedural states: Secondary | ICD-10-CM | POA: Diagnosis not present

## 2015-08-13 DIAGNOSIS — Z0000 Encounter for general adult medical examination without abnormal findings: Secondary | ICD-10-CM | POA: Diagnosis not present

## 2015-08-13 NOTE — Progress Notes (Signed)
Pre visit review using our clinic review tool, if applicable. No additional management support is needed unless otherwise documented below in the visit note.  Chief Complaint  Patient presents with  . Annual Exam    HPI: Patient  Jeremy Wolf  59 y.o. comes in today for Preventive Health Care visit .    He is sp MVrepair   Last cards check almost 2 years ago  Called and cards said had already been seen . No sx cp sob new.  Vision left eye getting worse under eval for  Cause  Evidence of toxo in past   mutiple opinions. Also seen at [facility] .   Had lazy eye as an infant  ( premie twin in hosp 30 days feeding ? o2  In the  Late 1950s .   Light  osa     No meds  isnt taking the viagra  Had some discomfort right side upper chest to shoulder  but not assoc  Sx  No sob cough.   Has neck arthritis but not radiating   Health Maintenance  Topic Date Due  . Hepatitis C Screening  08-09-1956  . HIV Screening  12/05/1971  . INFLUENZA VACCINE  07/23/2015  . TETANUS/TDAP  07/20/2023  . COLONOSCOPY  11/07/2024   Health Maintenance Review LIFESTYLE:  Exercise:   Not formal  Tobacco/ETS:no Alcohol: no Sugar beverages: dr pepper some  Coffee  Sleep:   Weekends   7 hours   About the same  Drug use: no  ROS:  See above  GEN/ HEENT: No fever, significant weight changes sweats headaches vCV/ PULM; No chest pain shortness of breath cough, syncope,edema  change in exercise tolerance. GI /GU: No adominal pain, vomiting, change in bowel habits. No blood in the stool. No significant GU symptoms. SKIN/HEME: ,no acute skin rashes suspicious lesions or bleeding. No lymphadenopathy, nodules, masses.  NEURO/ PSYCH:  No neurologic signs such as weakness numbness. No depression anxiety. IMM/ Allergy: No unusual infections.  Allergy .   REST of 12 system review negative except as per HPI   Past Medical History  Diagnosis Date  . Allergy   . Hyperlipidemia   . Mitral valve prolapse     a. with severe  regurgitation; s/p MV repair 2/10;  b. 07/2011 Echo: EF 50-55%, mild MR.  . Allergic rhinitis   . Patent foramen ovale     a. s/p closure at MV repair in 2010  . Nephrolithiasis     hx of  . History of atrial fibrillation     post op AFib  . CAD (coronary artery disease)     a. cath 12/09: oD1 50%, EF 65%;  b. negative ETT 7/11  . Plantar fasciitis of left foot 07/06/2012  . Atrial fibrillation 02/10/2009    Centricity Description: FIBRILLATION, ATRIAL Qualifier: Diagnosis of  By: Percival Spanish, MD, Farrel Gordon   Centricity Description: ATRIAL FIBRILLATION Qualifier: Diagnosis of  By: Sarajane Jews MD, Ishmael Holter   . Hx of colonic polyps   . Cataract   . Blood transfusion without reported diagnosis 2010    after heart surgery  . Sleep apnea   . Premature birth ?     twin  was in hosp 30 days feeding lazy eye small weight     Past Surgical History  Procedure Laterality Date  . Thoracotomy  2010    Rt miniature for mitral valve repair, quadrangular resection of posterior leaftlet with transposition of  native chordae tendineae x 2 and  58mm Edwards Physio II ring annuloplasty  . Patent foramen ovale closure  2010  . Hernia repair Right 2012    Inguinal 3 years ago  . Spine surgery  12/09/2010    vertebra 5 & 6    Family History  Problem Relation Age of Onset  . Diabetes Mother   . Hyperlipidemia Mother   . Hyperlipidemia Father   . Heart disease Father     heart surgery 84 ? cabd and valve  . Cancer Daughter 64    ovarian  . Lung cancer Maternal Grandfather     lung  . Emphysema Maternal Grandfather   . Arthritis Paternal Grandmother   . Arthritis Paternal Grandfather   . Rheumatic fever      2 sisters  . Other Sister     twin hx of substance use  . Colon cancer Neg Hx     Social History   Social History  . Marital Status: Married    Spouse Name: N/A  . Number of Children: N/A  . Years of Education: N/A   Social History Main Topics  . Smoking status: Never Smoker   .  Smokeless tobacco: Never Used  . Alcohol Use: 0.0 oz/week    0 Standard drinks or equivalent per week     Comment: OCC. WINE  . Drug Use: No  . Sexual Activity: Not Asked   Other Topics Concern  . 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 aspirin 81 MG EC tablet Take 81 mg by mouth daily.      . Misc Natural Products (RED WINE EXTRACT PO) Take by mouth daily.    . sildenafil (VIAGRA) 100 MG tablet Take 1 tablet (100 mg total) by mouth daily as needed for erectile dysfunction. 10 tablet 3  . simvastatin (ZOCOR) 40 MG tablet TAKE 1 TABLET AT BEDTIME 90 tablet 2  . amoxicillin-clavulanate (AUGMENTIN) 875-125 MG per tablet Take 1 tablet by mouth 2 (two) times daily. 20 tablet 0  . HYDROcodone-homatropine (HYDROMET) 5-1.5 MG/5ML syrup Take 5 mLs by mouth every 4 (four) hours as needed. 240 mL 0   No facility-administered medications prior to visit.     EXAM:  BP 116/80 mmHg  Temp(Src) 98.2 F (36.8 C) (Oral)  Ht 5' 9.5" (1.765 m)  Wt 194 lb 4.8 oz (88.134 kg)  BMI 28.29 kg/m2  Body mass index is 28.29 kg/(m^2).  Physical Exam: Vital signs reviewed ZYS:AYTK is a well-developed well-nourished alert cooperative    who appearsr stated age in no acute distress.  HEENT: normocephalic atraumatic , Eyes: PER EOM's full, conjunctiva clear, Nares: paten,t no deformity discharge or tenderness., Ears: no deformity EAC's clear TMs with normal landmarks. Mouth: clear OP, no lesions, edema.  Moist mucous membranes. Dentition in adequate repair. NECK: supple without masses,  thyromegaly or bruits. h ealed neck scar  CHEST/PULM:  Clear to auscultation and percussion breath sounds equal no wheeze , rales or rhonchi. No  chest wall deformities or tenderness. CV: PMI is nondisplaced, S1 S2 no gallops, murmurs, rubs. Peripheral pulses are full without delay.No JVD .  ABDOMEN: Bowel sounds normal nontender  No guard or rebound, no hepato splenomegal no CVA tenderness.  No hernia. Extremtities:  No clubbing cyanosis or edema, no acute joint swelling or redness no focal atrophy NEURO:  Oriented x3, cranial nerves 3-12 appear to be intact, no obvious focal weakness,gait within normal limits no abnormal reflexes or asymmetrical SKIN: No acute rashes normal turgor, color, no bruising or petechiae. PSYCH: Oriented, good eye contact, no obvious depression anxiety, cognition and judgment appear normal. LN: no cervical axillary inguinal adenopathy  Lab Results  Component Value Date   WBC 5.7 07/23/2015   HGB 13.7 07/23/2015   HCT 40.8 07/23/2015   PLT 191.0 07/23/2015   GLUCOSE 96 07/23/2015   CHOL 174 07/23/2015   TRIG 89.0 07/23/2015   HDL 51.80 07/23/2015   LDLDIRECT 154.7 07/12/2013   LDLCALC 104* 07/23/2015   ALT 25 07/23/2015   AST 17 07/23/2015   NA 142 07/23/2015   K 5.0 07/23/2015   CL 107 07/23/2015   CREATININE 0.91 07/23/2015   BUN 15 07/23/2015   CO2 27 07/23/2015   TSH 2.35 07/23/2015   PSA 0.57 07/23/2015   INR 2.0 05/23/2009   HGBA1C  01/19/2009    5.2 (NOTE)   The ADA recommends the following therapeutic goal for glycemic   control related to Hgb A1C measurement:   Goal of Therapy:   < 7.0% Hgb A1C   Reference: American Diabetes Association: Clinical Practice   Recommendations 2008, Diabetes Care,  2008, 31:(Suppl 1).    ASSESSMENT AND PLAN:  Discussed the following assessment and plan:  Visit for preventive health examination  Hyperlipidemia  S/P mitral valve repair  Right-sided chest pain  Vision decreased left  eye  - under eval   poss evidence of "parasite " toxo?  old infection  Patient Care Team: Burnis Medin, MD as PCP - General Minus Breeding, MD (Cardiology) Newman Pies, MD (Neurosurgery) Patient Instructions  Continue lifestyle intervention healthy eating and exercise . If the shoulder discomfort  continues get chest x ray .   Get flu vaccine   In the fall.   Wellness visit in a year    Will flag cardiology about  Visit  Due .       Standley Brooking. Shamirah Ivan M.D.

## 2015-08-13 NOTE — Patient Instructions (Signed)
Continue lifestyle intervention healthy eating and exercise . If the shoulder discomfort  continues get chest x ray .   Get flu vaccine   In the fall.   Wellness visit in a year    Will flag cardiology about  Visit  Due .

## 2015-09-28 ENCOUNTER — Other Ambulatory Visit: Payer: Self-pay | Admitting: Internal Medicine

## 2015-09-28 NOTE — Telephone Encounter (Signed)
Sent to the pharmacy by e-scribe. 

## 2015-10-18 ENCOUNTER — Ambulatory Visit: Payer: 59 | Admitting: Cardiology

## 2015-10-19 ENCOUNTER — Encounter: Payer: Self-pay | Admitting: Cardiology

## 2015-10-19 ENCOUNTER — Ambulatory Visit (INDEPENDENT_AMBULATORY_CARE_PROVIDER_SITE_OTHER): Payer: 59 | Admitting: Cardiology

## 2015-10-19 VITALS — BP 132/90 | HR 65 | Ht 69.5 in | Wt 204.3 lb

## 2015-10-19 DIAGNOSIS — Z9889 Other specified postprocedural states: Secondary | ICD-10-CM | POA: Diagnosis not present

## 2015-10-19 NOTE — Patient Instructions (Signed)
Your physician wants you to follow-up in: 18 Months. You will receive a reminder letter in the mail two months in advance. If you don't receive a letter, please call our office to schedule the follow-up appointment.  Your physician has requested that you have an echocardiogram. Echocardiography is a painless test that uses sound waves to create images of your heart. It provides your doctor with information about the size and shape of your heart and how well your heart's chambers and valves are working. This procedure takes approximately one hour. There are no restrictions for this procedure.

## 2015-10-19 NOTE — Progress Notes (Signed)
HPI The patient presents for followup of mitral valve repair. Since he was last seen he has had no further presyncope or palpitations which was a problem previously.  He does have infrequent sensation of pain under his left breast.  This is sporadic.  He has rare tingling in his hands and feet .  He's not having any shortness of breath, PND or orthopnea. He's not having any chest pressure, neck or arm discomfort. He has a vigorous job which requires lifting. He said this discomfort did exist a more his valve surgery but then went away   No Known Allergies  Current Outpatient Prescriptions  Medication Sig Dispense Refill  . aspirin 81 MG EC tablet Take 81 mg by mouth daily.      . Misc Natural Products (RED WINE EXTRACT PO) Take 1 tablet by mouth daily.     . simvastatin (ZOCOR) 40 MG tablet TAKE 1 TABLET AT BEDTIME 90 tablet 1   No current facility-administered medications for this visit.    Past Medical History  Diagnosis Date  . Allergy   . Hyperlipidemia   . Mitral valve prolapse     a. with severe regurgitation; s/p MV repair 2/10;  b. 07/2011 Echo: EF 50-55%, mild MR.  . Allergic rhinitis   . Patent foramen ovale     a. s/p closure at MV repair in 2010  . Nephrolithiasis     hx of  . History of atrial fibrillation     post op AFib  . CAD (coronary artery disease)     a. cath 12/09: oD1 50%, EF 65%;  b. negative ETT 7/11  . Plantar fasciitis of left foot 07/06/2012  . Atrial fibrillation 02/10/2009    Centricity Description: FIBRILLATION, ATRIAL Qualifier: Diagnosis of  By: Percival Spanish, MD, Farrel Gordon   Centricity Description: ATRIAL FIBRILLATION Qualifier: Diagnosis of  By: Sarajane Jews MD, Ishmael Holter   . Hx of colonic polyps   . Cataract   . Blood transfusion without reported diagnosis 2010    after heart surgery  . Sleep apnea   . Premature birth ?     twin  was in hosp 30 days feeding lazy eye small weight     Past Surgical History  Procedure Laterality Date  . Thoracotomy   2010    Rt miniature for mitral valve repair, quadrangular resection of posterior leaftlet with transposition of  native chordae tendineae x 2 and 62mm Edwards Physio II ring annuloplasty  . Patent foramen ovale closure  2010  . Hernia repair Right 2012    Inguinal 3 years ago  . Spine surgery  12/09/2010    vertebra 5 & 6    ROS:  As stated in the HPI and negative for all other systems.  PHYSICAL EXAM There were no vitals taken for this visit. GENERAL:  Well appearing NECK:  No jugular venous distention, waveform within normal limits, carotid upstroke brisk and symmetric, no bruits, no thyromegaly LYMPHATICS:  No cervical, inguinal adenopathy LUNGS:  Clear to auscultation bilaterally BACK:  No CVA tenderness CHEST:  Well healed surgical scar HEART:  PMI not displaced or sustained,S1 and S2 within normal limits, no S3, no S4, no clicks, no rubs, no murmurs ABD:  Flat, positive bowel sounds normal in frequency in pitch, no bruits, no rebound, no guarding, no midline pulsatile mass, no hepatomegaly, no splenomegaly EXT:  2 plus pulses throughout, no edema, no cyanosis no clubbing  EKG:  Sinus rhythm, rate 65, intervals within normal  limits, no acute ST-T wave changes.  There are blocked PACs. Left axis deviation.  10/19/2015   ASSESSMENT AND PLAN  Mitral Valve Repair -  I reviewed the echo today myself and his valve repair looks stable.  There is mild regurgitation.   I will check an echocardiogram.   Chest pain- I do not think that this is cardiac but probably skeletal or costochondral. No change in therapy is indicated.  PACs - I am not sure that he is feeling these.  He is doing it in the office and not noticing.  I don't think that a monitor would be helpful at this time but I will order one if he has increased symptoms.

## 2015-11-06 ENCOUNTER — Other Ambulatory Visit: Payer: Self-pay

## 2015-11-06 ENCOUNTER — Ambulatory Visit (HOSPITAL_COMMUNITY): Payer: 59 | Attending: Cardiology

## 2015-11-06 DIAGNOSIS — E785 Hyperlipidemia, unspecified: Secondary | ICD-10-CM | POA: Diagnosis not present

## 2015-11-06 DIAGNOSIS — I517 Cardiomegaly: Secondary | ICD-10-CM | POA: Diagnosis not present

## 2015-11-06 DIAGNOSIS — I5189 Other ill-defined heart diseases: Secondary | ICD-10-CM | POA: Diagnosis not present

## 2015-11-06 DIAGNOSIS — I340 Nonrheumatic mitral (valve) insufficiency: Secondary | ICD-10-CM | POA: Diagnosis not present

## 2015-11-06 DIAGNOSIS — Z9889 Other specified postprocedural states: Secondary | ICD-10-CM | POA: Insufficient documentation

## 2015-11-14 ENCOUNTER — Telehealth: Payer: Self-pay | Admitting: Cardiology

## 2015-11-14 NOTE — Telephone Encounter (Signed)
She would like his echo results from last week please.

## 2015-11-14 NOTE — Telephone Encounter (Signed)
Spoke with pt, aware has not been reviewed by dr hochrein. Will call back once reviewed.

## 2016-03-04 ENCOUNTER — Other Ambulatory Visit: Payer: Self-pay | Admitting: Internal Medicine

## 2016-03-04 NOTE — Telephone Encounter (Signed)
Sent to the pharmacy by e-scribe.  Pt has upcoming cpx on 08/18/16.

## 2016-08-11 ENCOUNTER — Other Ambulatory Visit: Payer: 59

## 2016-08-18 ENCOUNTER — Encounter: Payer: 59 | Admitting: Internal Medicine

## 2016-09-09 ENCOUNTER — Encounter: Payer: 59 | Admitting: Internal Medicine

## 2016-09-17 ENCOUNTER — Other Ambulatory Visit (INDEPENDENT_AMBULATORY_CARE_PROVIDER_SITE_OTHER): Payer: 59

## 2016-09-17 DIAGNOSIS — Z0000 Encounter for general adult medical examination without abnormal findings: Secondary | ICD-10-CM

## 2016-09-17 LAB — LIPID PANEL
CHOLESTEROL: 209 mg/dL — AB (ref 0–200)
HDL: 52.6 mg/dL (ref >39.00)
LDL Cholesterol: 127 mg/dL — ABNORMAL HIGH (ref 0–99)
NONHDL: 156.2
Total CHOL/HDL Ratio: 4
Triglycerides: 146 mg/dL (ref 0.0–149.0)
VLDL: 29.2 mg/dL (ref 0.0–40.0)

## 2016-09-17 LAB — CBC WITH DIFFERENTIAL/PLATELET
BASOS ABS: 0 10*3/uL (ref 0.0–0.1)
Basophils Relative: 0.7 % (ref 0.0–3.0)
EOS PCT: 3.1 % (ref 0.0–5.0)
Eosinophils Absolute: 0.2 10*3/uL (ref 0.0–0.7)
HEMATOCRIT: 41.7 % (ref 39.0–52.0)
Hemoglobin: 14.3 g/dL (ref 13.0–17.0)
Lymphocytes Relative: 42.3 % (ref 12.0–46.0)
Lymphs Abs: 2.3 10*3/uL (ref 0.7–4.0)
MCHC: 34.3 g/dL (ref 30.0–36.0)
MCV: 87.7 fl (ref 78.0–100.0)
MONOS PCT: 5.8 % (ref 3.0–12.0)
Monocytes Absolute: 0.3 10*3/uL (ref 0.1–1.0)
NEUTROS ABS: 2.6 10*3/uL (ref 1.4–7.7)
Neutrophils Relative %: 48.1 % (ref 43.0–77.0)
Platelets: 202 10*3/uL (ref 150.0–400.0)
RBC: 4.76 Mil/uL (ref 4.22–5.81)
RDW: 13.2 % (ref 11.5–15.5)
WBC: 5.4 10*3/uL (ref 4.0–10.5)

## 2016-09-17 LAB — HEPATIC FUNCTION PANEL
ALBUMIN: 4.1 g/dL (ref 3.5–5.2)
ALK PHOS: 59 U/L (ref 39–117)
ALT: 18 U/L (ref 0–53)
AST: 17 U/L (ref 0–37)
Bilirubin, Direct: 0.1 mg/dL (ref 0.0–0.3)
TOTAL PROTEIN: 6.6 g/dL (ref 6.0–8.3)
Total Bilirubin: 0.8 mg/dL (ref 0.2–1.2)

## 2016-09-17 LAB — TSH: TSH: 1.54 u[IU]/mL (ref 0.35–4.50)

## 2016-09-17 LAB — BASIC METABOLIC PANEL
BUN: 17 mg/dL (ref 6–23)
CHLORIDE: 103 meq/L (ref 96–112)
CO2: 27 meq/L (ref 19–32)
CREATININE: 1.03 mg/dL (ref 0.40–1.50)
Calcium: 9.2 mg/dL (ref 8.4–10.5)
GFR: 78.35 mL/min (ref >60.00)
GLUCOSE: 86 mg/dL (ref 70–99)
POTASSIUM: 4.1 meq/L (ref 3.5–5.1)
Sodium: 139 mEq/L (ref 135–145)

## 2016-09-17 LAB — PSA: PSA: 0.51 ng/mL (ref 0.10–4.00)

## 2016-09-18 LAB — HEPATITIS C ANTIBODY: HCV AB: NEGATIVE

## 2016-09-23 NOTE — Progress Notes (Signed)
Pre visit review using our clinic review tool, if applicable. No additional management support is needed unless otherwise documented below in the visit note.  Chief Complaint  Patient presents with  . Annual Exam    HPI: Patient  Jeremy Wolf  60 y.o. comes in today for Preventive Health Care visit  Energy problem Thinks it's more psychological than anything physical doing pretty well. Taking simvastatin for his lipids no chest pain shortness of breath cardiovascular symptoms. In regard to left eye vision he had a rapidly advancing cataract and now has a replacement can see but different magnification in each eye. Is pretty active in his job drives but is up and around in his local job.  Health Maintenance  Topic Date Due  . HIV Screening  12/05/1971  . TETANUS/TDAP  07/20/2023  . COLONOSCOPY  11/07/2024  . INFLUENZA VACCINE  Completed  . Hepatitis C Screening  Completed   Health Maintenance Review LIFESTYLE:  Exercise:     no Tobacco/ETS:  no Alcohol:   rare Sugar beverages: Sleep: 8 hours   Some snoring   Min osa in past  Uncertain if testing was helpful sleep very deeply Drug use: no HH of  2  Work: ave 45 - 50 hours     ROS:  GEN/ HEENT: No fever, significant weight changes sweats headaches vision problems hearing changes, CV/ PULM; No chest pain shortness of breath cough, syncope,edema  change in exercise tolerance. GI /GU: No adominal pain, vomiting, change in bowel habits. No blood in the stool. No significant GU symptoms. SKIN/HEME: ,no acute skin rashes suspicious lesions or bleeding. No lymphadenopathy, nodules, masses.  NEURO/ PSYCH:  No neurologic signs such as weakness numbness. No depression anxiety. IMM/ Allergy: No unusual infections.  Allergy .   REST of 12 system review negative except as per HPI   Past Medical History:  Diagnosis Date  . Allergic rhinitis   . Allergy   . Atrial fibrillation ([place]) 02/10/2009   Centricity Description: FIBRILLATION,  ATRIAL Qualifier: Diagnosis of  By: Percival Spanish, MD, Farrel Gordon   Centricity Description: ATRIAL FIBRILLATION Qualifier: Diagnosis of  By: Sarajane Jews MD, Ishmael Holter   . Blood transfusion without reported diagnosis 2010   after heart surgery  . CAD (coronary artery disease)    a. cath 12/09: oD1 50%, EF 65%;  b. negative ETT 7/11  . Cataract   . History of atrial fibrillation    post op AFib  . Hx of colonic polyps   . Hyperlipidemia   . Mitral valve prolapse    a. with severe regurgitation; s/p MV repair 2/10;  b. 07/2011 Echo: EF 50-55%, mild MR.  . Nephrolithiasis    hx of  . Patent foramen ovale    a. s/p closure at MV repair in 2010  . Plantar fasciitis of left foot 07/06/2012  . Premature birth ?    twin  was in hosp 30 days feeding lazy eye small weight   . Sleep apnea     Past Surgical History:  Procedure Laterality Date  . HERNIA REPAIR Right 2012   Inguinal 3 years ago  . PATENT FORAMEN OVALE CLOSURE  2010  . SPINE SURGERY  12/09/2010   vertebra 5 & 6  . THORACOTOMY  2010   Rt miniature for mitral valve repair, quadrangular resection of posterior leaftlet with transposition of  native chordae tendineae x 2 and 51mm Edwards Physio II ring annuloplasty    Family History  Problem Relation Age of Onset  .  Diabetes Mother   . Hyperlipidemia Mother   . Hyperlipidemia Father   . Heart disease Father     heart surgery 84 ? cabd and valve  . Cancer Daughter 25    ovarian  . Lung cancer Maternal Grandfather     lung  . Emphysema Maternal Grandfather   . Arthritis Paternal Grandmother   . Arthritis Paternal Grandfather   . Rheumatic fever      2 sisters  . Other Sister     twin hx of substance use  . Colon cancer Neg Hx     Social History   Social History  . Marital status: Married    Spouse name: N/A  . Number of children: N/A  . Years of education: N/A   Social History Main Topics  . Smoking status: Never Smoker  . Smokeless tobacco: Never Used  . Alcohol use 0.0  oz/week     Comment: OCC. WINE  . Drug use: No  . Sexual activity: Not Asked   Other Topics Concern  . 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 aspirin 81 MG EC tablet Take 81 mg by mouth daily.      . Misc Natural Products (RED WINE EXTRACT PO) Take 1 tablet by mouth daily.     . simvastatin (ZOCOR) 40 MG tablet TAKE 1 TABLET AT BEDTIME 90 tablet 1   No facility-administered medications prior to visit.      EXAM:  BP 136/88 (BP Location: Right Arm, Patient Position: Sitting, Cuff Size: Normal)   Temp 97.5 F (36.4 C) (Oral)   Ht 5' 9.5" (1.765 m)   Wt 195 lb (88.5 kg)   BMI 28.38 kg/m   Body mass index is 28.38 kg/m.  Physical Exam: Vital signs reviewed [id number] is a well-developed well-nourished alert cooperative    who appearsr stated age in no acute distress.  HEENT: normocephalic atraumatic , Eyes: PERRL EOM's full, conjunctiva clear, Nares: paten,t no deformity discharge or tenderness., Ears: no deformity EAC's clear TMs with normal landmarks. Mouth: clear OP, no lesions, edema.  Moist mucous membranes. Dentition in adequate repair. NECK: supple without masses, thyromegaly or bruits. CHEST/PULM:  Clear to auscultation and percussion breath sounds equal no wheeze , rales or rhonchi. No chest wall deformities or tenderness. Points to asymmetry but I don t feel  Lump abnormality well healed surgical scars .  CV: PMI is nondisplaced, S1 S2 no gallops, murmurs, rubs. Peripheral pulses are full without delay.No JVD .  ABDOMEN: Bowel sounds normal nontender  No  guard or rebound, no hepato splenomegal no CVA tenderness.  No hernia. Extremtities:  No clubbing cyanosis or edema, no acute joint swelling or redness no focal atrophy NEURO:  Oriented x3, cranial nerves 3-12 appear to be intact, no obvious focal weakness,gait within normal limits no abnormal reflexes or asymmetrical SKIN: No acute rashes normal turgor, color, no bruising or petechiae. PSYCH: Oriented, good eye contact, no obvious depression  anxiety, cognition and judgment appear normal. LN: no cervical axillary inguinal adenopathy  Lab Results  Component Value Date   WBC 5.4 09/17/2016   HGB 14.3 09/17/2016   HCT 41.7 09/17/2016   PLT 202.0 09/17/2016   GLUCOSE 86 09/17/2016   CHOL 209 (H) 09/17/2016   TRIG 146.0 09/17/2016   HDL 52.60 09/17/2016   LDLDIRECT 154.7 07/12/2013   LDLCALC 127 (H) 09/17/2016   ALT 18 09/17/2016   AST 17 09/17/2016   NA 139 09/17/2016   K 4.1 09/17/2016   CL 103 09/17/2016   CREATININE 1.03 09/17/2016   BUN 17 09/17/2016   CO2 27 09/17/2016   TSH 1.54 09/17/2016   PSA 0.51 09/17/2016   INR 2.0 05/23/2009   HGBA1C  01/19/2009    5.2 (NOTE)   The ADA recommends the following therapeutic goal for glycemic   control related to Hgb A1C measurement:   Goal of Therapy:   < 7.0% Hgb A1C   Reference: American Diabetes Association: Clinical Practice   Recommendations 2008, Diabetes Care,  2008, 31:(Suppl 1).    ASSESSMENT AND PLAN:  Discussed the following assessment and plan:  Visit for preventive health examination  Hyperlipidemia, unspecified hyperlipidemia type  S/P mitral valve repair  Need for prophylactic vaccination and inoculation against influenza - Plan: Flu Vaccine QUAD 36+ mos PF IM (Fluarix & Fluzone Quad PF) Option to change to more potent statin to get LDL down however he feels that he can do exercise and change diet to be at to be helpful and is doing well.  sleep study  2010  ahi 7  Max desat 82  Felt mild and to do consider reeval  if inc sx . Patient Care Team: Burnis Medin, MD as PCP - General Minus Breeding, MD (Cardiology) Newman Pies, MD (Neurosurgery) Patient Instructions  Exam is good . Uncertain  Why fatigue sleepiness .   Protein  Breakfast   Small is ok  Just take something  simple carbs can cause   Sugar to go up. Also sodas  Etc.  And make  You feel tired   Consideration  Of changing   Lipid med to stronger  One atorvastatin. or crestor.   See your cards at reg time .    Health Maintenance, Male A healthy lifestyle and preventative care can promote health and wellness.  Maintain regular health, dental, and eye exams.  Eat a healthy diet. Foods like vegetables, fruits, whole grains, low-fat dairy products, and lean protein foods contain the nutrients you need and are low in calories. Decrease your intake of foods high in solid fats, added sugars, and salt. Get information about a proper diet from your health care provider, if necessary.  Regular physical exercise is one of the most important things you can do for your health. Most adults should get at least 150 minutes of moderate-intensity exercise (any activity that increases your heart rate and causes you to sweat) each week. In addition, most adults need muscle-strengthening exercises on 2 or more days a week.   Maintain a healthy weight. The body mass index (BMI) is a screening tool to identify possible weight problems. It provides an estimate of body fat based on height and weight. Your health care provider can find your BMI and can help you achieve or maintain a healthy weight. For males 20 years and older:  A BMI below 18.5 is considered underweight.  A BMI of 18.5 to 24.9 is normal.  A BMI of 25  to 29.9 is considered overweight.  A BMI of 30 and above is considered obese.  Maintain normal blood lipids and cholesterol by exercising and minimizing your intake of saturated fat. Eat a balanced diet with plenty of fruits and vegetables. Blood  tests for lipids and cholesterol should begin at age 60 and be repeated every 5 years. If your lipid or cholesterol levels are high, you are over age 64, or you are at high risk for heart disease, you may need your cholesterol levels checked more frequently.Ongoing high lipid and cholesterol levels should be treated with medicines if diet and exercise are not working.  If you smoke, find out from your health care provider how to quit. If you do not use tobacco, do not start.  Lung cancer screening is recommended for adults aged 63-80 years who are at high risk for developing lung cancer because of a history of smoking. A yearly low-dose CT scan of the lungs is recommended for people who have at least a 30-pack-year history of smoking and are current smokers or have quit within the past 15 years. A pack year of smoking is smoking an average of 1 pack of cigarettes a day for 1 year (for example, a 30-pack-year history of smoking could mean smoking 1 pack a day for 30 years or 2 packs a day for 15 years). Yearly screening should continue until the smoker has stopped smoking for at least 15 years. Yearly screening should be stopped for people who develop a health problem that would prevent them from having lung cancer treatment.  If you choose to drink alcohol, do not have more than 2 drinks per day. One drink is considered to be 12 oz (360 mL) of beer, 5 oz (150 mL) of wine, or 1.5 oz (45 mL) of liquor.  Avoid the use of street drugs. Do not share needles with anyone. Ask for help if you need support or instructions about stopping the use of drugs.  High blood pressure causes heart disease and increases the risk of stroke. High blood pressure is more likely to develop in:  People who have blood pressure in the end of the normal range (100-139/85-89 mm Hg).  People who are overweight or obese.  People who are African American.  If you are 97-60 years of age, have your blood pressure checked every 3-5  years. If you are 81 years of age or older, have your blood pressure checked every year. You should have your blood pressure measured twice--once when you are at a hospital or clinic, and once when you are not at a hospital or clinic. Record the average of the two measurements. To check your blood pressure when you are not at a hospital or clinic, you can use:  An automated blood pressure machine at a pharmacy.  A home blood pressure monitor.  If you are 88-15 years old, ask your health care provider if you should take aspirin to prevent heart disease.  Diabetes screening involves taking a blood sample to check your fasting blood sugar level. This should be done once every 3 years after age 94 if you are at a normal weight and without risk factors for diabetes. Testing should be considered at a younger age or be carried out more frequently if you are overweight and have at least 1 risk factor for diabetes.  Colorectal cancer can be detected and often prevented. Most routine colorectal cancer screening begins at the age of 37 and continues through age 83.  However, your health care provider may recommend screening at an earlier age if you have risk factors for colon cancer. On a yearly basis, your health care provider may provide home test kits to check for hidden blood in the stool. A small camera at the end of a tube may be used to directly examine the colon (sigmoidoscopy or colonoscopy) to detect the earliest forms of colorectal cancer. Talk to your health care provider about this at age 20 when routine screening begins. A direct exam of the colon should be repeated every 5-10 years through age 11, unless early forms of precancerous polyps or small growths are found.  People who are at an increased risk for hepatitis B should be screened for this virus. You are considered at high risk for hepatitis B if:  You were born in a country where hepatitis B occurs often. Talk with your health care provider  about which countries are considered high risk.  Your parents were born in a high-risk country and you have not received a shot to protect against hepatitis B (hepatitis B vaccine).  You have HIV or AIDS.  You use needles to inject street drugs.  You live with, or have sex with, someone who has hepatitis B.  You are a man who has sex with other men (MSM).  You get hemodialysis treatment.  You take certain medicines for conditions like cancer, organ transplantation, and autoimmune conditions.  Hepatitis C blood testing is recommended for all people born from [birth year] through 1965 and any individual with known risk factors for hepatitis C.  Healthy men should no longer receive prostate-specific antigen (PSA) blood tests as part of routine cancer screening. Talk to your health care provider about prostate cancer screening.  Testicular cancer screening is not recommended for adolescents or adult males who have no symptoms. Screening includes self-exam, a health care provider exam, and other screening tests. Consult with your health care provider about any symptoms you have or any concerns you have about testicular cancer.  Practice safe sex. Use condoms and avoid high-risk sexual practices to reduce the spread of sexually transmitted infections (STIs).  You should be screened for STIs, including gonorrhea and chlamydia if:  You are sexually active and are younger than 24 years.  You are older than 24 years, and your health care provider tells you that you are at risk for this type of infection.  Your sexual activity has changed since you were last screened, and you are at an increased risk for chlamydia or gonorrhea. Ask your health care provider if you are at risk.  If you are at risk of being infected with HIV, it is recommended that you take a prescription medicine daily to prevent HIV infection. This is called pre-exposure prophylaxis (PrEP). You are considered at risk if:  You are a  man who has sex with other men (MSM).  You are a heterosexual man who is sexually active with multiple partners.  You take drugs by injection.  You are sexually active with a partner who has HIV.  Talk with your health care provider about whether you are at high risk of being infected with HIV. If you choose to begin PrEP, you should first be tested for HIV. You should then be tested every 3 months for as long as you are taking PrEP.  Use sunscreen. Apply sunscreen liberally and repeatedly throughout the day. You should seek shade when your shadow is shorter than you. Protect yourself by wearing long sleeves,  pants, a wide-brimmed hat, and sunglasses year round whenever you are outdoors.  Tell your health care provider of new moles or changes in moles, especially if there is a change in shape or color. Also, tell your health care provider if a mole is larger than the size of a pencil eraser.  A one-time screening for abdominal aortic aneurysm (AAA) and surgical repair of large AAAs by ultrasound is recommended for men aged 59-75 years who are current or former smokers.  Stay current with your vaccines (immunizations).   This information is not intended to replace advice given to you by your health care provider. Make sure you discuss any questions you have with your health care provider.   Document Released: 06/05/2008 Document Revised: 12/29/2014 Document Reviewed: 05/05/2011 Elsevier Interactive Patient Education 2016 Nelchina K. Panosh M.D.

## 2016-09-24 ENCOUNTER — Encounter: Payer: Self-pay | Admitting: Internal Medicine

## 2016-09-24 ENCOUNTER — Ambulatory Visit (INDEPENDENT_AMBULATORY_CARE_PROVIDER_SITE_OTHER): Payer: 59 | Admitting: Internal Medicine

## 2016-09-24 VITALS — BP 136/88 | Temp 97.5°F | Ht 69.5 in | Wt 195.0 lb

## 2016-09-24 DIAGNOSIS — Z23 Encounter for immunization: Secondary | ICD-10-CM

## 2016-09-24 DIAGNOSIS — Z9889 Other specified postprocedural states: Secondary | ICD-10-CM

## 2016-09-24 DIAGNOSIS — Z0000 Encounter for general adult medical examination without abnormal findings: Secondary | ICD-10-CM | POA: Diagnosis not present

## 2016-09-24 DIAGNOSIS — E785 Hyperlipidemia, unspecified: Secondary | ICD-10-CM | POA: Diagnosis not present

## 2016-09-24 NOTE — Patient Instructions (Addendum)
Exam is good . Uncertain  Why fatigue sleepiness .   Protein  Breakfast   Small is ok  Just take something  simple carbs can cause   Sugar to go up. Also sodas  Etc.  And make  You feel tired   Consideration  Of changing   Lipid med to stronger  One atorvastatin. or crestor.   See your cards at reg time .    Health Maintenance, Male A healthy lifestyle and preventative care can promote health and wellness.  Maintain regular health, dental, and eye exams.  Eat a healthy diet. Foods like vegetables, fruits, whole grains, low-fat dairy products, and lean protein foods contain the nutrients you need and are low in calories. Decrease your intake of foods high in solid fats, added sugars, and salt. Get information about a proper diet from your health care provider, if necessary.  Regular physical exercise is one of the most important things you can do for your health. Most adults should get at least 150 minutes of moderate-intensity exercise (any activity that increases your heart rate and causes you to sweat) each week. In addition, most adults need muscle-strengthening exercises on 2 or more days a week.   Maintain a healthy weight. The body mass index (BMI) is a screening tool to identify possible weight problems. It provides an estimate of body fat based on height and weight. Your health care provider can find your BMI and can help you achieve or maintain a healthy weight. For males 20 years and older:  A BMI below 18.5 is considered underweight.  A BMI of 18.5 to 24.9 is normal.  A BMI of 25 to 29.9 is considered overweight.  A BMI of 30 and above is considered obese.  Maintain normal blood lipids and cholesterol by exercising and minimizing your intake of saturated fat. Eat a balanced diet with plenty of fruits and vegetables. Blood tests for lipids and cholesterol should begin at age 57 and be repeated every 5 years. If your lipid or cholesterol levels are high, you are over age 58, or you  are at high risk for heart disease, you may need your cholesterol levels checked more frequently.Ongoing high lipid and cholesterol levels should be treated with medicines if diet and exercise are not working.  If you smoke, find out from your health care provider how to quit. If you do not use tobacco, do not start.  Lung cancer screening is recommended for adults aged 2-80 years who are at high risk for developing lung cancer because of a history of smoking. A yearly low-dose CT scan of the lungs is recommended for people who have at least a 30-pack-year history of smoking and are current smokers or have quit within the past 15 years. A pack year of smoking is smoking an average of 1 pack of cigarettes a day for 1 year (for example, a 30-pack-year history of smoking could mean smoking 1 pack a day for 30 years or 2 packs a day for 15 years). Yearly screening should continue until the smoker has stopped smoking for at least 15 years. Yearly screening should be stopped for people who develop a health problem that would prevent them from having lung cancer treatment.  If you choose to drink alcohol, do not have more than 2 drinks per day. One drink is considered to be 12 oz (360 mL) of beer, 5 oz (150 mL) of wine, or 1.5 oz (45 mL) of liquor.  Avoid the use of  street drugs. Do not share needles with anyone. Ask for help if you need support or instructions about stopping the use of drugs.  High blood pressure causes heart disease and increases the risk of stroke. High blood pressure is more likely to develop in:  People who have blood pressure in the end of the normal range (100-139/85-89 mm Hg).  People who are overweight or obese.  People who are African American.  If you are 36-80 years of age, have your blood pressure checked every 3-5 years. If you are 60 years of age or older, have your blood pressure checked every year. You should have your blood pressure measured twice--once when you are at  a hospital or clinic, and once when you are not at a hospital or clinic. Record the average of the two measurements. To check your blood pressure when you are not at a hospital or clinic, you can use:  An automated blood pressure machine at a pharmacy.  A home blood pressure monitor.  If you are 57-48 years old, ask your health care provider if you should take aspirin to prevent heart disease.  Diabetes screening involves taking a blood sample to check your fasting blood sugar level. This should be done once every 3 years after age 6 if you are at a normal weight and without risk factors for diabetes. Testing should be considered at a younger age or be carried out more frequently if you are overweight and have at least 1 risk factor for diabetes.  Colorectal cancer can be detected and often prevented. Most routine colorectal cancer screening begins at the age of 79 and continues through age 40. However, your health care provider may recommend screening at an earlier age if you have risk factors for colon cancer. On a yearly basis, your health care provider may provide home test kits to check for hidden blood in the stool. A small camera at the end of a tube may be used to directly examine the colon (sigmoidoscopy or colonoscopy) to detect the earliest forms of colorectal cancer. Talk to your health care provider about this at age 74 when routine screening begins. A direct exam of the colon should be repeated every 5-10 years through age 57, unless early forms of precancerous polyps or small growths are found.  People who are at an increased risk for hepatitis B should be screened for this virus. You are considered at high risk for hepatitis B if:  You were born in a country where hepatitis B occurs often. Talk with your health care provider about which countries are considered high risk.  Your parents were born in a high-risk country and you have not received a shot to protect against hepatitis B  (hepatitis B vaccine).  You have HIV or AIDS.  You use needles to inject street drugs.  You live with, or have sex with, someone who has hepatitis B.  You are a man who has sex with other men (MSM).  You get hemodialysis treatment.  You take certain medicines for conditions like cancer, organ transplantation, and autoimmune conditions.  Hepatitis C blood testing is recommended for all people born from [birth year] through 1965 and any individual with known risk factors for hepatitis C.  Healthy men should no longer receive prostate-specific antigen (PSA) blood tests as part of routine cancer screening. Talk to your health care provider about prostate cancer screening.  Testicular cancer screening is not recommended for adolescents or adult males who have no symptoms. Screening  includes self-exam, a health care provider exam, and other screening tests. Consult with your health care provider about any symptoms you have or any concerns you have about testicular cancer.  Practice safe sex. Use condoms and avoid high-risk sexual practices to reduce the spread of sexually transmitted infections (STIs).  You should be screened for STIs, including gonorrhea and chlamydia if:  You are sexually active and are younger than 24 years.  You are older than 24 years, and your health care provider tells you that you are at risk for this type of infection.  Your sexual activity has changed since you were last screened, and you are at an increased risk for chlamydia or gonorrhea. Ask your health care provider if you are at risk.  If you are at risk of being infected with HIV, it is recommended that you take a prescription medicine daily to prevent HIV infection. This is called pre-exposure prophylaxis (PrEP). You are considered at risk if:  You are a man who has sex with other men (MSM).  You are a heterosexual man who is sexually active with multiple partners.  You take drugs by injection.  You are  sexually active with a partner who has HIV.  Talk with your health care provider about whether you are at high risk of being infected with HIV. If you choose to begin PrEP, you should first be tested for HIV. You should then be tested every 3 months for as long as you are taking PrEP.  Use sunscreen. Apply sunscreen liberally and repeatedly throughout the day. You should seek shade when your shadow is shorter than you. Protect yourself by wearing long sleeves, pants, a wide-brimmed hat, and sunglasses year round whenever you are outdoors.  Tell your health care provider of new moles or changes in moles, especially if there is a change in shape or color. Also, tell your health care provider if a mole is larger than the size of a pencil eraser.  A one-time screening for abdominal aortic aneurysm (AAA) and surgical repair of large AAAs by ultrasound is recommended for men aged 54-75 years who are current or former smokers.  Stay current with your vaccines (immunizations).   This information is not intended to replace advice given to you by your health care provider. Make sure you discuss any questions you have with your health care provider.   Document Released: 06/05/2008 Document Revised: 12/29/2014 Document Reviewed: 05/05/2011 Elsevier Interactive Patient Education [employer].

## 2016-10-28 ENCOUNTER — Other Ambulatory Visit: Payer: Self-pay | Admitting: Internal Medicine

## 2016-10-29 NOTE — Telephone Encounter (Signed)
Sent to the pharmacy by e-scribe.  Pt has cpx scheduled for 09/28/17.

## 2017-02-10 ENCOUNTER — Encounter: Payer: Self-pay | Admitting: *Deleted

## 2017-02-10 ENCOUNTER — Ambulatory Visit (INDEPENDENT_AMBULATORY_CARE_PROVIDER_SITE_OTHER): Payer: 59 | Admitting: Family Medicine

## 2017-02-10 ENCOUNTER — Encounter: Payer: Self-pay | Admitting: Family Medicine

## 2017-02-10 VITALS — BP 112/80 | HR 76 | Temp 98.0°F | Ht 69.5 in | Wt 199.1 lb

## 2017-02-10 DIAGNOSIS — H6983 Other specified disorders of Eustachian tube, bilateral: Secondary | ICD-10-CM

## 2017-02-10 DIAGNOSIS — J3089 Other allergic rhinitis: Secondary | ICD-10-CM

## 2017-02-10 DIAGNOSIS — M26621 Arthralgia of right temporomandibular joint: Secondary | ICD-10-CM

## 2017-02-10 NOTE — Progress Notes (Signed)
HPI:  Acute visit for concern he has a Sinus infection: -started: yesterday -symptoms:nasal congestion, PND, R ear discomfort, some bring pain around R jaw and R parietofrontal region at times - improved today after taking of cap he wears and advil cold and sinus -denies:fever, SOB, NVD, tooth pain -sick contacts/travel/risks: no reported flu, strep or tick exposure -Hx of: allergies ROS: See pertinent positives and negatives per HPI.  Past Medical History:  Diagnosis Date  . Allergic rhinitis   . Allergy   . Atrial fibrillation ([place]) 02/10/2009   Centricity Description: FIBRILLATION, ATRIAL Qualifier: Diagnosis of  By: Percival Spanish, MD, Farrel Gordon   Centricity Description: ATRIAL FIBRILLATION Qualifier: Diagnosis of  By: Sarajane Jews MD, Ishmael Holter   . Blood transfusion without reported diagnosis 2010   after heart surgery  . CAD (coronary artery disease)    a. cath 12/09: oD1 50%, EF 65%;  b. negative ETT 7/11  . Cataract   . History of atrial fibrillation    post op AFib  . Hx of colonic polyps   . Hyperlipidemia   . Mitral valve prolapse    a. with severe regurgitation; s/p MV repair 2/10;  b. 07/2011 Echo: EF 50-55%, mild MR.  . Nephrolithiasis    hx of  . Patent foramen ovale    a. s/p closure at MV repair in 2010  . Plantar fasciitis of left foot 07/06/2012  . Premature birth ?    twin  was in hosp 30 days feeding lazy eye small weight   . Sleep apnea     Past Surgical History:  Procedure Laterality Date  . HERNIA REPAIR Right 2012   Inguinal 3 years ago  . PATENT FORAMEN OVALE CLOSURE  2010  . SPINE SURGERY  12/09/2010   vertebra 5 & 6  . THORACOTOMY  2010   Rt miniature for mitral valve repair, quadrangular resection of posterior leaftlet with transposition of  native chordae tendineae x 2 and 4mm Edwards Physio II ring annuloplasty    Family History  Problem Relation Age of Onset  . Diabetes Mother   . Hyperlipidemia Mother   . Hyperlipidemia Father   . Heart  disease Father     heart surgery 84 ? cabd and valve  . Cancer Daughter 47    ovarian  . Lung cancer Maternal Grandfather     lung  . Emphysema Maternal Grandfather   . Arthritis Paternal Grandmother   . Arthritis Paternal Grandfather   . Rheumatic fever      2 sisters  . Other Sister     twin hx of substance use  . Colon cancer Neg Hx     Social History   Social History  . Marital status: Married    Spouse name: N/A  . Number of children: N/A  . Years of education: N/A   Social History Main Topics  . Smoking status: Never Smoker  . Smokeless tobacco: Never Used  . Alcohol use 0.0 oz/week     Comment: OCC. WINE  . Drug use: No  . Sexual activity: Not Asked   Other Topics Concern  . 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  aspirin 81 MG EC tablet, Take 81 mg by mouth daily.  , Disp: , Rfl:  .  Misc Natural Products (RED WINE EXTRACT PO), Take 1 tablet by mouth daily. , Disp: , Rfl:  .  simvastatin (ZOCOR) 40 MG tablet, TAKE 1 TABLET AT BEDTIME, Disp: 90 tablet, Rfl: 3  EXAM:  Vitals:   02/10/17 0818  BP: 112/80  Pulse: 76  Temp: 98 F (36.7 C)    Body mass index is 28.98 kg/m.  GENERAL: vitals reviewed and listed above, alert, oriented, appears well hydrated and in no acute distress  HEENT: atraumatic, conjunttiva clear, no obvious abnormalities on inspection of external nose and ears, normal appearance of ear canals and TMs except for clear effusions bilat, clear nasal congestion, boggy pale turbinates, mild post oropharyngeal erythema with PND, no tonsillar edema or exudate, no  sinus TTP, crepitus R TMJ with some mild catching/deviation as he opens mouth, no locking, swelling, erythema, no TTP of temp art  NECK: no obvious masses on inspection, neg tinels, normal ROM head and neck, no bruit  LUNGS: clear to auscultation bilaterally, no wheezes, rales or rhonchi, good air movement  CV: HRRR, no peripheral edema  MS: moves all extremities without noticeable abnormality  PSYCH: pleasant and cooperative, no obvious depression or anxiety, speech and thought processing grossly intact, gait normal, finger to nose normal  ASSESSMENT AND PLAN:  Discussed the following assessment and plan:  Arthralgia of right temporomandibular joint  Acute allergic rhinitis due to other allergen, unspecified seasonality  Dysfunction of both eustachian tubes  -we discussed possible serious and likely etiologies, workup and treatment, treatment risks and return precautions -several factors may be contributing to his symptoms - perhaps most likely TMJ and HEP provided, avoidance of difficult to chew foods for a few weeks and prn nsaids in safe amounts discussed -will treat the allergies/ETD with flonase -trial avoiding tight bands on hat as occipital neuralgia was also considered on the differental -follow up 1 month -of course, we advised to return or notify a doctor immediately if symptoms worsen or persist or new concerns arise.    Patient Instructions  BEFORE YOU LEAVE: -follow up: in 1 month -TMJ exercises  Flonase nasal spray 2 sprays each nostril daily for 3 weeks to help with the fluid in the ears.  Do the exercises for the jaw  Try to avoid hats with a firm or tight band  Follow up sooner if worsening, new concerns or not improving    KIM, Jarrett Soho R., DO

## 2017-02-10 NOTE — Progress Notes (Signed)
Pre visit review using our clinic review tool, if applicable. No additional management support is needed unless otherwise documented below in the visit note. 

## 2017-02-10 NOTE — Patient Instructions (Signed)
BEFORE YOU LEAVE: -follow up: in 1 month -TMJ exercises  Flonase nasal spray 2 sprays each nostril daily for 3 weeks to help with the fluid in the ears.  Do the exercises for the jaw  Try to avoid hats with a firm or tight band  Follow up sooner if worsening, new concerns or not improving

## 2017-03-09 NOTE — Progress Notes (Signed)
Chief Complaint  Patient presents with  . Follow-up    HPI: Jeremy Wolf 61 y.o. come in forfu  See Dr Maudie Mercury for  r head jaw area pain pain in feb. Resolved in a few days  And didn't take med  Thinks jaw click related no teeth issues    However wanted to relate   Other sx for a month or so   Am nausea   After a while .no vomiting  ongoing without cp   Not assoc spec with headaches    Not  Noticing  in day if keep busy .   Getting  Staggering feeling  At times  In shop   Varies if stands still tghen goes away  Some light headaded?     No falling trauma .   Doesn't drink but feels like would be drink  So stops to reset and resume.   ocass achy bejhin d legs.  20 second   Sinus drainage  Always there. .     Get ocass sinus ha  Takes iadvil sinus and cold    ocass floaters without HA  But these are not that often .  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  Still tires  ? Cause    ROS: See pertinent positives and negatives per HPI. ocass palpitations   Short lived  No inc edema but sdoes have sock lines .  No bleeding  Had nause anorexia afte a spaghetti dineer at church but no  Vomiting,  No numbness weakness except hands at night poss cts  Past Medical History:  Diagnosis Date  . Allergic rhinitis   . Allergy   . Atrial fibrillation ([place]) 02/10/2009   Centricity Description: FIBRILLATION, ATRIAL Qualifier: Diagnosis of  By: Percival Spanish, MD, Farrel Gordon   Centricity Description: ATRIAL FIBRILLATION Qualifier: Diagnosis of  By: Sarajane Jews MD, Ishmael Holter   . Blood transfusion without reported diagnosis 2010   after heart surgery  . CAD (coronary artery disease)    a. cath 12/09: oD1 50%, EF 65%;  b. negative ETT 7/11  . Cataract   . History of atrial fibrillation    post op AFib  . Hx of colonic polyps   . Hyperlipidemia   . Mitral valve prolapse    a. with severe regurgitation; s/p MV repair 2/10;  b. 07/2011 Echo: EF  50-55%, mild MR.  . Nephrolithiasis    hx of  . Patent foramen ovale    a. s/p closure at MV repair in 2010  . Plantar fasciitis of left foot 07/06/2012  . Premature birth ?    twin  was in hosp 30 days feeding lazy eye small weight   . Sleep apnea     Family History  Problem Relation Age of Onset  . Diabetes Mother   . Hyperlipidemia Mother   . Hyperlipidemia Father   . Heart disease Father     heart surgery 84 ? cabd and valve  . Cancer Daughter 53    ovarian  . Lung cancer Maternal Grandfather     lung  . Emphysema Maternal Grandfather   . Arthritis Paternal Grandmother   . Arthritis Paternal Grandfather   . Rheumatic fever      2 sisters  . Other Sister     twin hx of substance use  . Colon cancer Neg Hx     Social History   Social  History  . Marital status: Married    Spouse name: N/A  . Number of children: N/A  . Years of education: N/A   Social History Main Topics  . Smoking status: Never Smoker  . Smokeless tobacco: Never Used  . Alcohol use 0.0 oz/week     Comment: OCC. WINE  . Drug use: No  . Sexual activity: Not Asked   Other Topics Concern  . 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 aspirin 81 MG EC tablet Take 81 mg by mouth daily.      . Misc Natural Products (RED WINE EXTRACT PO) Take 1 tablet by mouth daily.     . simvastatin (ZOCOR) 40 MG tablet TAKE 1 TABLET AT BEDTIME 90 tablet 3   No facility-administered medications prior to visit.      EXAM:  BP 102/70 (BP Location: Left Arm, Patient Position: Sitting, Cuff Size:  Normal)   Pulse 69   Temp 98 F (36.7 C) (Oral)   Ht 5' 9.5" (1.765 m)   Wt 192 lb (87.1 kg)   BMI 27.95 kg/m   Body mass index is 27.95 kg/m.  GENERAL: vitals reviewed and listed above, alert, oriented, appears well hydrated and in no acute distress HEENT: atraumatic, conjunctiva  clear, no obvious abnormalities on inspection of external nose and ears OP : no lesion edema or exudate  NECK: no obvious masses on inspection palpation  LUNGS: clear to auscultation bilaterally, no wheezes, rales or rhonchi, good air movement CV: HRRR,  Hs distant but  Steady no clubbing cyanosis or  peripheral edema nl cap refill  MS: moves all extremities without noticeable focal  abnormality PSYCH: pleasant and cooperative, no obvious depression or anxiety Looks tired  NEURO: oriented x 3 CN 3-12 appear intact. No focal muscle weakness or atrophy. DTRs symmetrical. Gait WNL.  Grossly non focal. No tremor or abnormal movement.  But   Has difficulty with hell to toe  Forward   And backward     Lab Results  Component Value Date   WBC 5.4 09/17/2016   HGB 14.3 09/17/2016   HCT 41.7 09/17/2016   PLT 202.0 09/17/2016   GLUCOSE 86 09/17/2016   CHOL 209 (H) 09/17/2016   TRIG 146.0 09/17/2016   HDL 52.60 09/17/2016   LDLDIRECT 154.7 07/12/2013   LDLCALC 127 (H) 09/17/2016   ALT 18 09/17/2016   AST 17 09/17/2016   NA 139 09/17/2016   K 4.1 09/17/2016   CL 103 09/17/2016   CREATININE 1.03 09/17/2016   BUN 17 09/17/2016   CO2 27 09/17/2016   TSH 1.54 09/17/2016   PSA 0.51 09/17/2016   INR 2.0 05/23/2009   HGBA1C  01/19/2009    5.2 (NOTE)   The ADA recommends the following therapeutic goal for glycemic   control related to Hgb A1C measurement:   Goal of Therapy:   < 7.0% Hgb A1C   Reference: American Diabetes Association: Clinical Practice  Recommendations 2008, Diabetes Care,  2008, 31:(Suppl 1).   BP Readings from Last 3 Encounters:  03/10/17 102/70  02/10/17 112/80  09/24/16 136/88     ASSESSMENT AND PLAN:  Discussed the following assessment and plan:  Imbalance - Plan: Ambulatory referral to Neurology  Other specified hypotension   bp reading 100/70  Nausea  S/P mitral valve repair  Uncertain cause of his symptoms over the last month. Did not come on suddenly. 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  Blood pressure on the low side. History of mitral valve repair and peri operative A. fib.?  Plan cardiology evaluation as to the potential causes of his symptoms lightheadedness low blood pressure.  ? Reecho r/o arrythmia?       n neurology evaluationfor staggering feeling     consider MRI of head further evaluation but no acute finding today and will refer first.  It appears like his baseline headaches with are not very common sound like migraine.  Does not sound like nausea is CNS related could be GI related can try ranitidine.  All his findings are subtle but new for him and do not appear that mood stress alone are the cause of his symptoms.  -Patient advised to return or notify health care team  if  new concerns arise.  Before evaluation   Patient Instructions  Plan on getting cardiology to see you   To make sure no cardiac   symptoms  .  Will get contacted about a neurology evaluation because of the imbalance sx .   The sinus problem could cause  Dizziness  But sinus  headaches could also be migraines  Contact us if   Any thing getting worse .  I agree the past head pain   Could be jaw and  Surface  Nerve related .    Could try ranitidine  150 mg twice a day . For 2-3 weeks to see if helps the Gi sx. If so can try off  And on .    Plan ROV   after above evaluations       1-2  months          Wanda K. Panosh M.D.

## 2017-03-10 ENCOUNTER — Ambulatory Visit (INDEPENDENT_AMBULATORY_CARE_PROVIDER_SITE_OTHER): Payer: 59 | Admitting: Internal Medicine

## 2017-03-10 ENCOUNTER — Encounter: Payer: Self-pay | Admitting: Internal Medicine

## 2017-03-10 VITALS — BP 102/70 | HR 69 | Temp 98.0°F | Ht 69.5 in | Wt 192.0 lb

## 2017-03-10 DIAGNOSIS — Z9889 Other specified postprocedural states: Secondary | ICD-10-CM | POA: Diagnosis not present

## 2017-03-10 DIAGNOSIS — R110 Nausea: Secondary | ICD-10-CM

## 2017-03-10 DIAGNOSIS — I9589 Other hypotension: Secondary | ICD-10-CM | POA: Diagnosis not present

## 2017-03-10 DIAGNOSIS — R2689 Other abnormalities of gait and mobility: Secondary | ICD-10-CM | POA: Diagnosis not present

## 2017-03-10 NOTE — Patient Instructions (Addendum)
Plan on getting cardiology to see you   To make sure no cardiac   symptoms  .  Will get contacted about a neurology evaluation because of the imbalance sx .   The sinus problem could cause  Dizziness  But sinus  headaches could also be migraines  Contact us if   Any thing getting worse .  I agree the past head pain   Could be jaw and  Surface  Nerve related .    Could try ranitidine  150 mg twice a day . For 2-3 weeks to see if helps the Gi sx. If so can try off  And on .    Plan ROV   after above evaluations       1-2  months

## 2017-03-12 ENCOUNTER — Encounter: Payer: Self-pay | Admitting: Neurology

## 2017-03-18 ENCOUNTER — Telehealth: Payer: Self-pay | Admitting: Cardiology

## 2017-04-10 NOTE — Telephone Encounter (Signed)
Closed encounter °

## 2017-04-21 ENCOUNTER — Encounter: Payer: Self-pay | Admitting: Cardiology

## 2017-04-22 DIAGNOSIS — L03115 Cellulitis of right lower limb: Secondary | ICD-10-CM | POA: Diagnosis not present

## 2017-05-07 NOTE — Progress Notes (Signed)
HPI The patient presents for followup of mitral valve repair.  I last saw him in 2016.  He has had some dizziness.  He noticed this when he had a sinus infection.  He thought also it was happening when he was doing some heavy work.  He has now a different assignment at his job and his sinuses are OK and he has no dizziness.  EKG today does demonstrate Mobitz 1 heart block. He otherwise has done well.  The patient denies any new symptoms such as chest discomfort, neck or arm discomfort. There has been no new shortness of breath, PND or orthopnea. There have been no reported palpitations, presyncope or syncope.    No Known Allergies  Current Outpatient Prescriptions  Medication Sig Dispense Refill  . aspirin 81 MG EC tablet Take 81 mg by mouth daily.      . Misc Natural Products (RED WINE EXTRACT PO) Take 1 tablet by mouth daily.     . simvastatin (ZOCOR) 40 MG tablet TAKE 1 TABLET AT BEDTIME 90 tablet 3   No current facility-administered medications for this visit.     Past Medical History:  Diagnosis Date  . Allergic rhinitis   . Allergy   . Atrial fibrillation ([place]) 02/10/2009   Centricity Description: FIBRILLATION, ATRIAL Qualifier: Diagnosis of  By: Percival Spanish, MD, Farrel Gordon   Centricity Description: ATRIAL FIBRILLATION Qualifier: Diagnosis of  By: Sarajane Jews MD, Ishmael Holter   . Blood transfusion without reported diagnosis 2010   after heart surgery  . CAD (coronary artery disease)    a. cath 12/09: oD1 50%, EF 65%;  b. negative ETT 7/11  . Cataract   . History of atrial fibrillation    post op AFib  . Hx of colonic polyps   . Hyperlipidemia   . Mitral valve prolapse    a. with severe regurgitation; s/p MV repair 2/10;  b. 07/2011 Echo: EF 50-55%, mild MR.  . Nephrolithiasis    hx of  . Patent foramen ovale    a. s/p closure at MV repair in 2010  . Plantar fasciitis of left foot 07/06/2012  . Premature birth ?    twin  was in hosp 30 days feeding lazy eye small weight   . Sleep  apnea     Past Surgical History:  Procedure Laterality Date  . HERNIA REPAIR Right 2012   Inguinal 3 years ago  . PATENT FORAMEN OVALE CLOSURE  2010  . SPINE SURGERY  12/09/2010   vertebra 5 & 6  . THORACOTOMY  2010   Rt miniature for mitral valve repair, quadrangular resection of posterior leaftlet with transposition of  native chordae tendineae x 2 and 61mm Edwards Physio II ring annuloplasty    ROS:  As stated in the HPI and negative for all other systems.  PHYSICAL EXAM BP 118/78   Pulse (!) 57   Ht 5\' 9"  (1.753 m)   Wt 188 lb 9.6 oz (85.5 kg)   BMI 27.85 kg/m   GENERAL:  Well appearing HEENT:  Pupils equal round and reactive, fundi not visualized, oral mucosa unremarkable NECK:  No jugular venous distention, waveform within normal limits, carotid upstroke brisk and symmetric, no bruits, no thyromegaly LYMPHATICS:  No cervical, inguinal adenopathy LUNGS:  Clear to auscultation bilaterally BACK:  No CVA tenderness CHEST:  Well healed surgical scar. HEART:  PMI not displaced or sustained,S1 and S2 within normal limits, no S3, no S4, no clicks, no rubs, no murmurs ABD:  Flat, positive bowel sounds normal in frequency in pitch, no bruits, no rebound, no guarding, no midline pulsatile mass, no hepatomegaly, no splenomegaly EXT:  2 plus pulses throughout, no edema, no cyanosis no clubbing SKIN:  No rashes no nodules NEURO:  Cranial nerves II through XII grossly intact, motor grossly intact throughout PSYCH:  Cognitively intact, oriented to person place and time   EKG:  Sinus rhythm, Mobitz type I,  rate 57, intervals within normal limits, no acute ST-T wave changes.  There are blocked PACs. Left axis deviation.  05/08/2017   ASSESSMENT AND PLAN  Mitral Valve Repair -  This was stable on echo in 2016.  He needs follow up echocardiography.   Mobitz I Heart Block - I will start with a 24 hour Holter.  This could have been related to his recent dizziness.  However, he says  this is resolved.    Sleep Apnea - This diagnosis was mild years ago and he feels well.  I will consider repeat testing in the future.

## 2017-05-08 ENCOUNTER — Ambulatory Visit (INDEPENDENT_AMBULATORY_CARE_PROVIDER_SITE_OTHER): Payer: 59 | Admitting: Cardiology

## 2017-05-08 ENCOUNTER — Encounter (INDEPENDENT_AMBULATORY_CARE_PROVIDER_SITE_OTHER): Payer: Self-pay

## 2017-05-08 ENCOUNTER — Encounter: Payer: Self-pay | Admitting: Cardiology

## 2017-05-08 VITALS — BP 118/78 | HR 57 | Ht 69.0 in | Wt 188.6 lb

## 2017-05-08 DIAGNOSIS — R001 Bradycardia, unspecified: Secondary | ICD-10-CM | POA: Diagnosis not present

## 2017-05-08 DIAGNOSIS — Z9889 Other specified postprocedural states: Secondary | ICD-10-CM

## 2017-05-08 DIAGNOSIS — R42 Dizziness and giddiness: Secondary | ICD-10-CM | POA: Insufficient documentation

## 2017-05-08 NOTE — Patient Instructions (Signed)
Medication Instructions:  Continue current medications  Labwork: None Ordered  Testing/Procedures: Your physician has recommended that you wear a 24 hour holter monitor. Holter monitors are medical devices that record the heart's electrical activity. Doctors most often use these monitors to diagnose arrhythmias. Arrhythmias are problems with the speed or rhythm of the heartbeat. The monitor is a small, portable device. You can wear one while you do your normal daily activities. This is usually used to diagnose what is causing palpitations/syncope (passing out).  Your physician has requested that you have an echocardiogram. Echocardiography is a painless test that uses sound waves to create images of your heart. It provides your doctor with information about the size and shape of your heart and how well your heart's chambers and valves are working. This procedure takes approximately one hour. There are no restrictions for this procedure.  Follow-Up: Your physician wants you to follow-up in: 1 Year. You will receive a reminder letter in the mail two months in advance. If you don't receive a letter, please call our office to schedule the follow-up appointment.   Any Other Special Instructions Will Be Listed Below (If Applicable).   If you need a refill on your cardiac medications before your next appointment, please call your pharmacy.

## 2017-05-11 ENCOUNTER — Ambulatory Visit: Payer: 59 | Admitting: Internal Medicine

## 2017-05-13 ENCOUNTER — Ambulatory Visit: Payer: 59 | Admitting: Neurology

## 2017-05-14 NOTE — Progress Notes (Signed)
Chief Complaint  Patient presents with  . Follow-up    HPI: Jeremy Wolf 61 y.o. come in forfu episode of  Imbalance  Nausea  And head poain and dizziness although not at the same time   Doing better since back to prev  Activity at work  . Has seen dr Lemmie Evens  And to do further eval.  Cardiology to do heart monitor  Neuro  Has appt June  Balance is better   Balance issues went away and  Head sensitivity went away    naueas better  nbut didn't take medication . 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     ROS: See pertinent positives and negatives per HPI.  Past Medical History:  Diagnosis Date  . Allergic rhinitis   . Allergy   . Atrial fibrillation ([place]) 02/10/2009   Centricity Description: FIBRILLATION, ATRIAL Qualifier: Diagnosis of  By: Percival Spanish, MD, Farrel Gordon   Centricity Description: ATRIAL FIBRILLATION Qualifier: Diagnosis of  By: Sarajane Jews MD, Ishmael Holter   . Blood transfusion without reported diagnosis 2010   after heart surgery  . CAD (coronary artery disease)    a. cath 12/09: oD1 50%, EF 65%;  b. negative ETT 7/11  . Cataract   . History of atrial fibrillation    post op AFib  . Hx of colonic polyps   . Hyperlipidemia   . Mitral valve prolapse    a. with severe regurgitation; s/p MV repair 2/10;  b. 07/2011 Echo: EF 50-55%, mild MR.  . Nephrolithiasis    hx of  . Patent foramen ovale    a. s/p closure at MV repair in 2010  . Plantar fasciitis of left foot 07/06/2012  . Premature birth ?    twin  was in hosp 30 days feeding lazy eye small weight   . Sleep apnea     Family History  Problem Relation Age of Onset  . Diabetes Mother   . Hyperlipidemia Mother   . Hyperlipidemia Father   . Heart disease Father        heart surgery 84 ? cabd and valve  . Cancer Daughter 69       ovarian  . Lung cancer Maternal Grandfather        lung  . Emphysema Maternal Grandfather   . Arthritis Paternal Grandmother   . Arthritis Paternal Grandfather     . Rheumatic fever Unknown        2 sisters  . Other Sister        twin hx of substance use  . Colon cancer Neg Hx     Social History   Social History  . Marital status: Married    Spouse name: N/A  . Number of children: N/A  . Years of education: N/A   Social History Main Topics  . Smoking status: Never Smoker  . Smokeless tobacco: Never Used  . Alcohol use 0.0 oz/week     Comment: OCC. WINE  . Drug use: No  . Sexual activity: Not Asked   Other Topics Concern  . 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 aspirin 81 MG EC tablet Take 81 mg by mouth daily.      . Misc Natural Products (RED WINE EXTRACT PO) Take 1 tablet by mouth daily.     . simvastatin (ZOCOR) 40 MG tablet TAKE 1 TABLET AT BEDTIME 90 tablet 3   No facility-administered medications prior to visit.      EXAM:  BP 110/80 (BP Location: Right Arm, Patient Position: Sitting, Cuff Size: Normal)   Pulse 67   Temp 97.6 F (36.4 C) (Oral)   Ht 5\' 9"  (1.753 m)   Wt 193 lb 9.6 oz (87.8 kg)   BMI 28.59 kg/m   Body mass index is 28.59 kg/m.  GENERAL: vitals reviewed and listed above, alert, oriented, appears well hydrated and in no acute distress HEENT: atraumatic, conjunctiva  clear, no obvious abnormalities on inspection of external nose and ears   NECK: no obvious masses on inspection palpation  LUNGS: clear to auscultation bilaterally, no wheezes, rales or rhonchi, good air movement CV: HRRR, no clubbing cyanosis or  peripheral edema nl cap refill  MS: moves all extremities without noticeable  focal  abnormality PSYCH: pleasant and cooperative, no obvious depression or anxiety Lab Results  Component Value Date   WBC 5.4 09/17/2016   HGB 14.3 09/17/2016   HCT 41.7 09/17/2016   PLT 202.0 09/17/2016   GLUCOSE 86 09/17/2016   CHOL 209 (H) 09/17/2016   TRIG 146.0 09/17/2016   HDL 52.60 09/17/2016   LDLDIRECT 154.7 07/12/2013   LDLCALC 127 (H) 09/17/2016   ALT 18 09/17/2016   AST 17 09/17/2016   NA 139 09/17/2016   K 4.1 09/17/2016   CL 103 09/17/2016   CREATININE 1.03 09/17/2016   BUN 17 09/17/2016   CO2 27 09/17/2016   TSH 1.54 09/17/2016   PSA 0.51 09/17/2016   INR 2.0 05/23/2009   HGBA1C  01/19/2009    5.2 (NOTE)   The ADA recommends the following therapeutic goal for glycemic   control related to Hgb A1C measurement:   Goal of Therapy:   < 7.0% Hgb A1C   Reference: American Diabetes Association: Clinical Practice   Recommendations 2008, Diabetes Care,  2008, 31:(Suppl 1).   BP Readings from Last 3 Encounters:  05/15/17 110/80  05/08/17 118/78  03/10/17 102/70    ASSESSMENT AND PLAN:  Discussed the following assessment and plan:  Imbalance - improved   Nausea - improved   AV block, Mobitz 1 - as reported by dr hochrien under eval  Sx better   Poss  Related to physical stress of  Prev duties   But  But concern that balance issue occurred .   If recurs see [place] asap.  ? Should he be eval for  Cryptogenic  Cns event or just follow   . neuro appt not  till June   Fu if recurs .  Looks well today .  -Patient advised to return or notify health care team  if  new concerns arise.  Patient Instructions  I think the   Area on ankle .  Is getting better  Just keep covered .  Glad   you are doing better  But keep .    Appts for reason explained .    Check up in  Early October .  Standley Brooking. Dawon Troop M.D.

## 2017-05-15 ENCOUNTER — Ambulatory Visit (INDEPENDENT_AMBULATORY_CARE_PROVIDER_SITE_OTHER): Payer: 59 | Admitting: Internal Medicine

## 2017-05-15 ENCOUNTER — Encounter: Payer: Self-pay | Admitting: Internal Medicine

## 2017-05-15 VITALS — BP 110/80 | HR 67 | Temp 97.6°F | Ht 69.0 in | Wt 193.6 lb

## 2017-05-15 DIAGNOSIS — I441 Atrioventricular block, second degree: Secondary | ICD-10-CM

## 2017-05-15 DIAGNOSIS — R110 Nausea: Secondary | ICD-10-CM

## 2017-05-15 DIAGNOSIS — R2689 Other abnormalities of gait and mobility: Secondary | ICD-10-CM

## 2017-05-15 NOTE — Patient Instructions (Addendum)
I think the   Area on ankle .  Is getting better  Just keep covered .  Glad   you are doing better  But keep .    Appts for reason explained .    Check up in  Early October .

## 2017-05-26 ENCOUNTER — Ambulatory Visit (HOSPITAL_COMMUNITY): Payer: 59 | Attending: Cardiovascular Disease

## 2017-05-26 ENCOUNTER — Other Ambulatory Visit: Payer: Self-pay

## 2017-05-26 ENCOUNTER — Ambulatory Visit (INDEPENDENT_AMBULATORY_CARE_PROVIDER_SITE_OTHER): Payer: 59

## 2017-05-26 DIAGNOSIS — I2510 Atherosclerotic heart disease of native coronary artery without angina pectoris: Secondary | ICD-10-CM | POA: Diagnosis not present

## 2017-05-26 DIAGNOSIS — Z9889 Other specified postprocedural states: Secondary | ICD-10-CM

## 2017-05-26 DIAGNOSIS — R001 Bradycardia, unspecified: Secondary | ICD-10-CM

## 2017-05-26 DIAGNOSIS — I517 Cardiomegaly: Secondary | ICD-10-CM | POA: Insufficient documentation

## 2017-05-26 DIAGNOSIS — I059 Rheumatic mitral valve disease, unspecified: Secondary | ICD-10-CM | POA: Diagnosis present

## 2017-05-29 ENCOUNTER — Telehealth: Payer: Self-pay | Admitting: Cardiology

## 2017-05-29 NOTE — Telephone Encounter (Signed)
Notes recorded by Minus Breeding, MD on 05/28/2017 at 9:14 AM EDT Mitral valve repair looks OK. No change in therapy. EF is low normal and I will follow all of this clinically. Call Mr. Howton with the results and send results to Panosh, Standley Brooking, MD  Patient aware of results holter monitor results are not yet available - aware he should get a call about those next week

## 2017-05-29 NOTE — Telephone Encounter (Signed)
Pt returning Nya's call from yesterday,concerning his Echo results

## 2017-05-29 NOTE — Telephone Encounter (Signed)
New message   Pt wife is returning call about echo results.

## 2017-06-04 ENCOUNTER — Telehealth: Payer: Self-pay | Admitting: Cardiology

## 2017-06-04 NOTE — Telephone Encounter (Signed)
Please call,pt would like his monitor results please.

## 2017-06-04 NOTE — Telephone Encounter (Signed)
Notes recorded by Minus Breeding, MD on 05/30/2017 at 10:49 AM EDT He does have some bradycardia. However, he says that he is no longer having dizziness. I would like to see him back in about 3 months to discuss or sooner if he has any further dizziness. Call Mr. Denardo with the results and send results to Panosh, Standley Brooking, MD   Patient aware of results-3 month follow up scheduled.  Will call sooner if dizziness reoccurs.   Forwarded to PCP.

## 2017-06-19 ENCOUNTER — Encounter: Payer: Self-pay | Admitting: Neurology

## 2017-06-19 ENCOUNTER — Ambulatory Visit (INDEPENDENT_AMBULATORY_CARE_PROVIDER_SITE_OTHER): Payer: 59 | Admitting: Neurology

## 2017-06-19 VITALS — BP 112/78 | HR 68 | Ht 69.0 in | Wt 191.6 lb

## 2017-06-19 DIAGNOSIS — R42 Dizziness and giddiness: Secondary | ICD-10-CM | POA: Diagnosis not present

## 2017-06-19 NOTE — Progress Notes (Signed)
NEUROLOGY CONSULTATION NOTE  Jeremy Wolf MRN: [id number] DOB: [birth year]  Referring provider: Dr. Regis Bill Primary care provider: Dr. Regis Bill  Reason for consult:  imbalance  HISTORY OF PRESENT ILLNESS: Jeremy Wolf is a 61 year old right-handed male with atrial fibrillation, MVP s/p MVR, CAD, hyeprllipidemia who presents for imbalance.  History supplemented by PCP note.  He has had dizzy spells off and on since his MVR 8 years ago.  He describes it as a "wavy" sensation that occurs usually when he stands up or bends down.  It lasts less than a minute.  It is not clearly a spinning sensation or sensation that he is going to pass out.  There is no associated nausea, double vision, vision loss, slurred speech, facial droop or unilateral numbness or weakness.    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  All of the lifting, bending over, standing and squatting has triggered the dizziness, only the symptoms are more pronounced.  This has been occurring since at least January.  Several months ago, he also reported nausea, which has since resolved.  Occasionally, he feels an electric shock pain moving across his head from front to back.  All of these symptoms are separate but have pretty much resolved (he still occasionally has the brief shock sensation on his head). He reports some mild tinnitus in both ears (left greater than right).  He denies hearing loss or aural fullness.  He ambulates okay in between spells.  He was seen by cardiology.  He was found to have Mobitz I heart block, which may be related to dizziness.  Echo showed from 05/26/17 showed EF 50-55%.  Mitral valve repair looked okay.  24 hour Holter on 05/26/17 demonstrated some bradycardia.  PAST MEDICAL HISTORY: Past Medical History:  Diagnosis Date  . Allergic rhinitis   . Allergy   . Atrial fibrillation ([place]) 02/10/2009   Centricity Description: FIBRILLATION, ATRIAL Qualifier: Diagnosis of  By:  Percival Spanish, MD, Farrel Gordon   Centricity Description: ATRIAL FIBRILLATION Qualifier: Diagnosis of  By: Sarajane Jews MD, Ishmael Holter   . Blood transfusion without reported diagnosis 2010   after heart surgery  . CAD (coronary artery disease)    a. cath 12/09: oD1 50%, EF 65%;  b. negative ETT 7/11  . Cataract   . History of atrial fibrillation    post op AFib  . Hx of colonic polyps   . Hyperlipidemia   . Mitral valve prolapse    a. with severe regurgitation; s/p MV repair 2/10;  b. 07/2011 Echo: EF 50-55%, mild MR.  . Nephrolithiasis    hx of  . Patent foramen ovale    a. s/p closure at MV repair in 2010  . Plantar fasciitis of left foot 07/06/2012  . Premature birth ?    twin  was in hosp 30 days feeding lazy eye small weight   . Sleep apnea     PAST SURGICAL HISTORY: Past Surgical History:  Procedure Laterality Date  . HERNIA REPAIR Right 2012   Inguinal 3 years ago  . PATENT FORAMEN OVALE CLOSURE  2010  . SPINE SURGERY  12/09/2010   vertebra 5 & 6  . THORACOTOMY  2010   Rt miniature for mitral valve repair, quadrangular resection of posterior leaftlet with transposition of  native chordae tendineae x 2 and 42mm Edwards Physio II ring annuloplasty    MEDICATIONS: Current Outpatient Prescriptions on File Prior to Visit  Medication Sig  Dispense Refill  . aspirin 81 MG EC tablet Take 81 mg by mouth daily.      . Misc Natural Products (RED WINE EXTRACT PO) Take 1 tablet by mouth daily.     . simvastatin (ZOCOR) 40 MG tablet TAKE 1 TABLET AT BEDTIME 90 tablet 3   No current facility-administered medications on file prior to visit.     ALLERGIES: No Known Allergies  FAMILY HISTORY: Family History  Problem Relation Age of Onset  . Diabetes Mother   . Hyperlipidemia Mother   . Hyperlipidemia Father   . Heart disease Father        heart surgery 84 ? cabd and valve  . Cancer Daughter 10       ovarian  . Lung cancer Maternal Grandfather        lung  . Emphysema Maternal Grandfather    . Arthritis Paternal Grandmother   . Arthritis Paternal Grandfather   . Rheumatic fever Unknown        2 sisters  . Other Sister        twin hx of substance use  . Colon cancer Neg Hx     SOCIAL HISTORY: Social History   Social History  . Marital status: Married    Spouse name: N/A  . Number of children: N/A  . Years of education: N/A   Occupational History  . Not on file.   Social History Main Topics  . Smoking status: Never Smoker  . Smokeless tobacco: Never Used  . Alcohol use 0.0 oz/week     Comment: OCC. WINE  . Drug use: No  . Sexual activity: Not on file   Other Topics Concern  . 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   Sleeps; 7- 8 hours   Married (second marriage) 4 children   Regular exercise- no    Married summer 2007   caffeine 10-12 cups coffee   HH of 5   No pets   Originally from the [place]   Was a twin birth midly  Premature  Fraternal  Twin sister     REVIEW OF SYSTEMS: Constitutional: No fevers, chills, or sweats, no generalized fatigue, change in appetite Eyes: No visual changes, double vision, eye pain Ear, nose and throat: No hearing loss, ear pain, nasal congestion, sore throat Cardiovascular: No chest pain, palpitations Respiratory:  No shortness of breath at rest or with exertion, wheezes GastrointestinaI: No nausea, vomiting, diarrhea, abdominal pain, fecal incontinence Genitourinary:  No dysuria, urinary retention or frequency Musculoskeletal:  No neck pain, back pain Integumentary: No rash, pruritus, skin lesions Neurological: as above Psychiatric: No depression, insomnia, anxiety Endocrine: No palpitations, fatigue, diaphoresis, mood swings, change in appetite, change in weight, increased thirst Hematologic/Lymphatic:  No purpura, petechiae. Allergic/Immunologic: no itchy/runny eyes, nasal congestion, recent allergic reactions,  rashes  PHYSICAL EXAM: Vitals:   06/19/17 0747  BP: 112/78  Pulse: 68   General: No acute distress.  Patient appears well-groomed.  Head:  Normocephalic/atraumatic Eyes:  fundi examined but not visualized Neck: supple, no paraspinal tenderness, full range of motion Back: No paraspinal tenderness Heart: regular rate and rhythm Lungs: Clear to auscultation bilaterally. Vascular: No carotid bruits. Neurological Exam: Mental status: alert and oriented to person, place, and time, recent and remote memory intact, fund of knowledge intact, attention and concentration intact, speech fluent and not dysarthric, language intact. Cranial  nerves: CN I: not tested CN II: pupils equal, round and reactive to light, visual fields intact CN III, IV, VI:  full range of motion, no nystagmus, no ptosis CN V: facial sensation intact CN VII: upper and lower face symmetric CN VIII: hearing intact CN IX, X: gag intact, uvula midline CN XI: sternocleidomastoid and trapezius muscles intact CN XII: tongue midline Bulk & Tone: normal, no fasciculations. Motor:  5/5 throughout  Sensation:  Pinprick and vibration sensation intact. Deep Tendon Reflexes:  2+ throughout, toes downgoing.  Finger to nose testing:  Without dysmetria.  Heel to shin:  Without dysmetria.  Gait:  Normal station and stride.  Able to turn and tandem walk. Romberg with mild sway.  IMPRESSION: Intermittent dizziness.  Vague.  Orthostatics are negative.   This is a chronic problem for several years, only exacerbated by the heavy physical activity at his new job (not because he had a sudden onset of new severe symptoms.  As he didn't have a sudden onset of symptoms, pretty much triggered by change in position and lasting less than a minute and does not describe any focal or lateralizing symptoms, I less likely suspect a cerebrovascular event.  It may be mild benign positional vertigo (as he does describe vague sensation of movement).  40  minutes spent face to face with patient, over 50% spent discussing differential diagnosis and my impression.  Thank you for allowing me to take part in the care of this patient.  Metta Clines, DO  CC:  Shanon Ace, MD

## 2017-06-19 NOTE — Patient Instructions (Signed)
I don't suspect anything serious as this has been ongoing for several years.  Some people are just more sensitive to dizziness.  I don't think further testing such as brain imaging would be helpful in determining a specific cause.

## 2017-07-13 DIAGNOSIS — B354 Tinea corporis: Secondary | ICD-10-CM | POA: Diagnosis not present

## 2017-09-04 NOTE — Progress Notes (Signed)
HPI The patient presents for followup of mitral valve repair.  At the last visit he had some dizziness and low heart rate.  MV repair was stable on echo this year.  Holter demonstrated Mobitz Type 1.  He reported that his dizziness had improved.  Since then he has done well.  He does lack energy but has no acute complaints.  He says that he sleeps well.  The patient denies any new symptoms such as chest discomfort, neck or arm discomfort. There has been no new shortness of breath, PND or orthopnea. There have been no reported palpitations, presyncope or syncope.    No Known Allergies  Current Outpatient Prescriptions  Medication Sig Dispense Refill  . aspirin 81 MG EC tablet Take 81 mg by mouth daily.      . Misc Natural Products (RED WINE EXTRACT PO) Take 1 tablet by mouth daily.     . simvastatin (ZOCOR) 40 MG tablet TAKE 1 TABLET AT BEDTIME 90 tablet 3   No current facility-administered medications for this visit.     Past Medical History:  Diagnosis Date  . Allergic rhinitis   . Allergy   . Atrial fibrillation ([place]) 02/10/2009   Centricity Description: FIBRILLATION, ATRIAL Qualifier: Diagnosis of  By: Percival Spanish, MD, Farrel Gordon   Centricity Description: ATRIAL FIBRILLATION Qualifier: Diagnosis of  By: Sarajane Jews MD, Ishmael Holter   . Blood transfusion without reported diagnosis 2010   after heart surgery  . CAD (coronary artery disease)    a. cath 12/09: oD1 50%, EF 65%;  b. negative ETT 7/11  . Cataract   . History of atrial fibrillation    post op AFib  . Hx of colonic polyps   . Hyperlipidemia   . Mitral valve prolapse    a. with severe regurgitation; s/p MV repair 2/10;  b. 07/2011 Echo: EF 50-55%, mild MR.  . Nephrolithiasis    hx of  . Patent foramen ovale    a. s/p closure at MV repair in 2010  . Plantar fasciitis of left foot 07/06/2012  . Premature birth ?    twin  was in hosp 30 days feeding lazy eye small weight   . Sleep apnea     Past Surgical History:  Procedure  Laterality Date  . HERNIA REPAIR Right 2012   Inguinal 3 years ago  . PATENT FORAMEN OVALE CLOSURE  2010  . SPINE SURGERY  12/09/2010   vertebra 5 & 6  . THORACOTOMY  2010   Rt miniature for mitral valve repair, quadrangular resection of posterior leaftlet with transposition of  native chordae tendineae x 2 and 48mm Edwards Physio II ring annuloplasty    ROS:  Occasional neck pain over the right SCM.  Otherwise as stated in the HPI and negative for all other systems.  PHYSICAL EXAM BP 120/82   Pulse 69   Ht 5\' 9"  (1.753 m)   Wt 195 lb 12.8 oz (88.8 kg)   BMI 28.91 kg/m   GENERAL:  Well appearing NECK:  No jugular venous distention, waveform within normal limits, carotid upstroke brisk and symmetric, no bruits, no thyromegaly LUNGS:  Clear to auscultation bilaterally CHEST:  Well healed thoracotomy scar.   HEART:  PMI not displaced or sustained,S1 and S2 within normal limits, no S3, no S4, no clicks, no rubs, no murmurs ABD:  Flat, positive bowel sounds normal in frequency in pitch, no bruits, no rebound, no guarding, no midline pulsatile mass, no hepatomegaly, no splenomegaly EXT:  2 plus pulses throughout, no edema, no cyanosis no clubbing   Lab Results  Component Value Date   CHOL 209 (H) 09/17/2016   TRIG 146.0 09/17/2016   HDL 52.60 09/17/2016   LDLCALC 127 (H) 09/17/2016   LDLDIRECT 154.7 07/12/2013    ASSESSMENT AND PLAN  Mitral Valve Repair -  This was stable on echo in June of this year.  No further imaging is indicated.  He understands SBE prophylaxis.  Mobitz I Heart Block - He did not have more significant bradycardia on a Holter this year.  No further testing is planned.  No change in therapy.   Sleep Apnea - This diagnosis was mild years ago.  I discussed that this could be related.  He does not think so.

## 2017-09-07 ENCOUNTER — Ambulatory Visit (INDEPENDENT_AMBULATORY_CARE_PROVIDER_SITE_OTHER): Payer: 59 | Admitting: Cardiology

## 2017-09-07 ENCOUNTER — Encounter: Payer: Self-pay | Admitting: Cardiology

## 2017-09-07 VITALS — BP 120/82 | HR 69 | Ht 69.0 in | Wt 195.8 lb

## 2017-09-07 DIAGNOSIS — Z9889 Other specified postprocedural states: Secondary | ICD-10-CM

## 2017-09-07 DIAGNOSIS — I441 Atrioventricular block, second degree: Secondary | ICD-10-CM | POA: Diagnosis not present

## 2017-09-07 NOTE — Patient Instructions (Signed)
Medication Instructions:  Continue current medications  If you need a refill on your cardiac medications before your next appointment, please call your pharmacy.  Labwork: None Ordered   Testing/Procedures: None Ordered  Follow-Up: Your physician wants you to follow-up in: 1 Year. You should receive a reminder letter in the mail two months in advance. If you do not receive a letter, please call our office [phone]   Thank you for choosing [facility] at [facility]!!

## 2017-09-21 ENCOUNTER — Other Ambulatory Visit: Payer: 59

## 2017-09-25 NOTE — Progress Notes (Signed)
No chief complaint on file.   HPI: Patient  Jeremy Wolf  61 y.o. comes in today for Preventive Health Care visit   Had tired episode  And lopw bp 110 and feet up and  Better . Cardiology yearly check  And  No events . On feet a lot.    Health Maintenance  Topic Date Due  . HIV Screening  12/05/1971  . INFLUENZA VACCINE  07/22/2017  . TETANUS/TDAP  07/20/2023  . COLONOSCOPY  11/07/2024  . Hepatitis C Screening  Completed   Health Maintenance Review LIFESTYLE:  Exercise:     No time  Tobacco/ETS: no Alcohol:  no Sugar beverages:  No  Some feasting  Dr pepper  Sleep:  10 - 5 am  Drug use: no HH of  2  Work: ave 60 hour or more .  ROS:  Toe nail fungust right fell off  Left about the same  GEN/ HEENT: No fever, significant weight changes sweats headaches vision problems hearing changes, CV/ PULM; No chest pain shortness of breath cough, syncope,edema  change in exercise tolerance. GI /GU: No adominal pain, vomiting, change in bowel habits. No blood in the stool. No significant GU symptoms. SKIN/HEME: ,no acute skin rashes suspicious lesions or bleeding. No lymphadenopathy, nodules, masses.  NEURO/ PSYCH:  No neurologic signs such as weakness numbness. No depression anxiety. IMM/ Allergy: No unusual infections.  Allergy .   REST of 12 system review negative except as per HPI   Past Medical History:  Diagnosis Date  . Allergic rhinitis   . Allergy   . Atrial fibrillation ([place]) 02/10/2009   Centricity Description: FIBRILLATION, ATRIAL Qualifier: Diagnosis of  By: Percival Spanish, MD, Farrel Gordon   Centricity Description: ATRIAL FIBRILLATION Qualifier: Diagnosis of  By: Sarajane Jews MD, Ishmael Holter   . Blood transfusion without reported diagnosis 2010   after heart surgery  . CAD (coronary artery disease)    a. cath 12/09: oD1 50%, EF 65%;  b. negative ETT 7/11  . Cataract   . History of atrial fibrillation    post op AFib  . Hx of colonic polyps   . Hyperlipidemia   . Mitral valve  prolapse    a. with severe regurgitation; s/p MV repair 2/10;  b. 07/2011 Echo: EF 50-55%, mild MR.  . Nephrolithiasis    hx of  . Patent foramen ovale    a. s/p closure at MV repair in 2010  . Plantar fasciitis of left foot 07/06/2012  . Premature birth ?    twin  was in hosp 30 days feeding lazy eye small weight   . Sleep apnea     Past Surgical History:  Procedure Laterality Date  . HERNIA REPAIR Right 2012   Inguinal 3 years ago  . PATENT FORAMEN OVALE CLOSURE  2010  . SPINE SURGERY  12/09/2010   vertebra 5 & 6  . THORACOTOMY  2010   Rt miniature for mitral valve repair, quadrangular resection of posterior leaftlet with transposition of  native chordae tendineae x 2 and 32m Edwards Physio II ring annuloplasty    Family History  Problem Relation Age of Onset  . Diabetes Mother   . Hyperlipidemia Mother   . Hyperlipidemia Father   . Heart disease Father        heart surgery 84 ? cabd and valve  . Cancer Daughter 246      ovarian  . Lung cancer Maternal Grandfather        lung  .  Emphysema Maternal Grandfather   . Arthritis Paternal Grandmother   . Arthritis Paternal Grandfather   . Rheumatic fever Unknown        2 sisters  . Other Sister        twin hx of substance use  . Colon cancer Neg Hx     Social History   Social History  . Marital status: Married    Spouse name: N/A  . Number of children: N/A  . Years of education: N/A   Social History Main Topics  . Smoking status: Never Smoker  . Smokeless tobacco: Never Used  . Alcohol use 0.0 oz/week     Comment: OCC. WINE  . Drug use: No  . Sexual activity: Not Asked   Other Topics Concern  . 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 aspirin 81 MG EC tablet Take 81 mg by mouth daily.      . Misc Natural Products (RED WINE EXTRACT PO) Take 1 tablet by mouth daily.     . simvastatin (ZOCOR) 40 MG tablet TAKE 1 TABLET AT BEDTIME 90 tablet 3   No facility-administered medications prior to visit.      EXAM:  BP 122/78   Pulse 67   Temp 98.4 F (36.9 C) (Oral)   Resp 16   Ht 5' 9"  (1.753 m)   Wt 193 lb 6.4 oz (87.7 kg)   SpO2 96%   BMI 28.56 kg/m   Body mass index is 28.56 kg/m. Wt Readings from Last 3 Encounters:  09/28/17 193 lb 6.4 oz (87.7 kg)  09/07/17 195 lb 12.8 oz (88.8 kg)  06/19/17 191 lb 9.6 oz (86.9 kg)    Physical Exam: Vital signs reviewed QTM:AUQJ is a well-developed well-nourished alert cooperative    who appearsr stated age in no acute distress.  HEENT: normocephalic atraumatic , Eyes: PERRL EOM's full, conjunctiva clear, Nares: paten,t no deformity discharge or tenderness., Ears: no deformity EAC's clear TMs with normal landmarks. Mouth: clear OP, no lesions, edema.  Moist mucous membranes. Dentition in adequate repair. X missing tooth ( WC  Waiting )  NECK: supple without masses, thyromegaly or bruits. CHEST/PULM:  Clear to auscultation and percussion breath sounds equal no wheeze , rales or rhonchi. No chest wall deformities or tenderness. Breast: normal by inspection . No dimpling, discharge, masses, tenderness or discharge . CV: PMI is nondisplaced, S1 S2 no gallops, murmurs, rubs. Peripheral pulses are full without delay.No JVD .  ABDOMEN: Bowel sounds normal nontender  No guard or rebound, no hepato splenomegal no CVA tenderness.  No hernia. Extremtities:  No clubbing cyanosis or edema, no acute joint swelling or redness no focal atrophy has some vv and mild swelling 1 +  NEURO:  Oriented x3, cranial nerves 3-12 appear to be intact, no obvious focal  weakness,gait within normal limits no abnormal reflexes or asymmetrical SKIN: No acute rashes normal turgor, color, no bruising or petechiae. lef great toe  onychomycosis no redness  PSYCH: Oriented, good eye contact, no obvious depression anxiety, cognition and judgment appear normal. LN: no cervical axillary inguinal adenopathy  Lab Results  Component Value Date   WBC 5.4 09/17/2016   HGB 14.3 09/17/2016   HCT 41.7 09/17/2016   PLT 202.0 09/17/2016   GLUCOSE 86 09/17/2016   CHOL 209 (H) 09/17/2016   TRIG 146.0 09/17/2016   HDL 52.60 09/17/2016   LDLDIRECT 154.7 07/12/2013   LDLCALC 127 (H) 09/17/2016   ALT 18 09/17/2016   AST 17 09/17/2016   NA 139 09/17/2016   K 4.1 09/17/2016   CL 103 09/17/2016   CREATININE 1.03 09/17/2016   BUN 17 09/17/2016   CO2 27 09/17/2016   TSH 1.54 09/17/2016   PSA 0.51 09/17/2016   INR 2.0 05/23/2009   HGBA1C  01/19/2009    5.2 (NOTE)   The ADA recommends the following therapeutic goal for glycemic   control related to Hgb A1C measurement:   Goal of Therapy:   < 7.0% Hgb A1C   Reference: American Diabetes Association: Clinical Practice   Recommendations 2008, Diabetes Care,  2008, 31:(Suppl 1).    BP Readings from Last 3 Encounters:  09/28/17 122/78  09/07/17 120/82  06/19/17 112/78    Lab results reviewed with patient   ASSESSMENT AND PLAN:  Discussed the following assessment and plan:  Visit for preventive health examination - Plan: CBC with Differential/Platelet, Lipid panel, PSA, Hepatic function panel, Basic metabolic panel  S/P mitral valve repair - Plan: CBC with Differential/Platelet, Lipid panel, PSA, Hepatic function panel, Basic metabolic panel  Hyperlipidemia, unspecified hyperlipidemia type - Plan: CBC with Differential/Platelet, Lipid panel, PSA, Hepatic function panel, Basic metabolic panel  Need for immunization against influenza - Plan: Flu Vaccine QUAD 36+ mos IM  Medication management - Plan: CBC with  Differential/Platelet, Lipid panel, Hepatic function panel, Basic metabolic panel  Screening PSA (prostate specific antigen) - Plan: PSA  Other fatigue - Plan: CBC with Differential/Platelet, Lipid panel, Hepatic function panel, Basic metabolic panel Shared Decision Making   psa   Attend to hydration and see how  Orthostatic sx go . He is acitve and sweats a lot during day  Work . Patient Care Team: Rashaad Hallstrom, Standley Brooking, MD as PCP - Cheral Bay, MD (Cardiology) Newman Pies, MD (Neurosurgery) Patient Instructions  Stay hydrated as discussed .  See if that helps  Fatigue .  Exam is good today  consider compression stockings when up and around .    shingles vaccine. Shingrix  Can receive either in the office or pharmacy depending on insurance rules.        Preventive Care 40-64 Years, Male Preventive care refers to lifestyle choices and visits with your health care provider that can promote health and wellness. What does preventive care include?  A yearly physical exam. This is also called an annual well check.  Dental exams once or twice a year.  Routine eye exams. Ask your health care provider how often you should have your eyes checked.  Personal lifestyle choices, including: ? Daily care of your teeth and gums. ? Regular physical activity. ? Eating a healthy diet. ? Avoiding tobacco and drug use. ? Limiting alcohol use. ? Practicing safe sex. ? Taking low-dose aspirin every day starting at age 66. What happens during an annual well check? The services and screenings done by your health care provider during your annual well check will depend on your age, overall health, lifestyle risk factors, and  family history of disease. Counseling Your health care provider may ask you questions about your:  Alcohol use.  Tobacco use.  Drug use.  Emotional well-being.  Home and relationship well-being.  Sexual activity.  Eating habits.  Work and work  Statistician.  Screening You may have the following tests or measurements:  Height, weight, and BMI.  Blood pressure.  Lipid and cholesterol levels. These may be checked every 5 years, or more frequently if you are over 88 years old.  Skin check.  Lung cancer screening. You may have this screening every year starting at age 76 if you have a 30-pack-year history of smoking and currently smoke or have quit within the past 15 years.  Fecal occult blood test (FOBT) of the stool. You may have this test every year starting at age 60.  Flexible sigmoidoscopy or colonoscopy. You may have a sigmoidoscopy every 5 years or a colonoscopy every 10 years starting at age 64.  Prostate cancer screening. Recommendations will vary depending on your family history and other risks.  Hepatitis C blood test.  Hepatitis B blood test.  Sexually transmitted disease (STD) testing.  Diabetes screening. This is done by checking your blood sugar (glucose) after you have not eaten for a while (fasting). You may have this done every 1-3 years.  Discuss your test results, treatment options, and if necessary, the need for more tests with your health care provider. Vaccines Your health care provider may recommend certain vaccines, such as:  Influenza vaccine. This is recommended every year.  Tetanus, diphtheria, and acellular pertussis (Tdap, Td) vaccine. You may need a Td booster every 10 years.  Varicella vaccine. You may need this if you have not been vaccinated.  Zoster vaccine. You may need this after age 66.  Measles, mumps, and rubella (MMR) vaccine. You may need at least one dose of MMR if you were born in 1957 or later. You may also need a second dose.  Pneumococcal 13-valent conjugate (PCV13) vaccine. You may need this if you have certain conditions and have not been vaccinated.  Pneumococcal polysaccharide (PPSV23) vaccine. You may need one or two doses if you smoke cigarettes or if you have  certain conditions.  Meningococcal vaccine. You may need this if you have certain conditions.  Hepatitis A vaccine. You may need this if you have certain conditions or if you travel or work in places where you may be exposed to hepatitis A.  Hepatitis B vaccine. You may need this if you have certain conditions or if you travel or work in places where you may be exposed to hepatitis B.  Haemophilus influenzae type b (Hib) vaccine. You may need this if you have certain risk factors.  Talk to your health care provider about which screenings and vaccines you need and how often you need them. This information is not intended to replace advice given to you by your health care provider. Make sure you discuss any questions you have with your health care provider. Document Released: 01/04/2016 Document Revised: 08/27/2016 Document Reviewed: 10/09/2015 Elsevier Interactive Patient Education  2017 [place]. Maythe Deramo M.D.

## 2017-09-28 ENCOUNTER — Encounter: Payer: Self-pay | Admitting: Internal Medicine

## 2017-09-28 ENCOUNTER — Ambulatory Visit (INDEPENDENT_AMBULATORY_CARE_PROVIDER_SITE_OTHER): Payer: 59 | Admitting: Internal Medicine

## 2017-09-28 VITALS — BP 122/78 | HR 67 | Temp 98.4°F | Resp 16 | Ht 69.0 in | Wt 193.4 lb

## 2017-09-28 DIAGNOSIS — Z125 Encounter for screening for malignant neoplasm of prostate: Secondary | ICD-10-CM

## 2017-09-28 DIAGNOSIS — Z9889 Other specified postprocedural states: Secondary | ICD-10-CM | POA: Diagnosis not present

## 2017-09-28 DIAGNOSIS — Z79899 Other long term (current) drug therapy: Secondary | ICD-10-CM

## 2017-09-28 DIAGNOSIS — Z0000 Encounter for general adult medical examination without abnormal findings: Secondary | ICD-10-CM

## 2017-09-28 DIAGNOSIS — Z23 Encounter for immunization: Secondary | ICD-10-CM

## 2017-09-28 DIAGNOSIS — R5383 Other fatigue: Secondary | ICD-10-CM | POA: Diagnosis not present

## 2017-09-28 DIAGNOSIS — E785 Hyperlipidemia, unspecified: Secondary | ICD-10-CM

## 2017-09-28 LAB — CBC WITH DIFFERENTIAL/PLATELET
BASOS ABS: 0 10*3/uL (ref 0.0–0.1)
Basophils Relative: 0.7 % (ref 0.0–3.0)
Eosinophils Absolute: 0.2 10*3/uL (ref 0.0–0.7)
Eosinophils Relative: 3.6 % (ref 0.0–5.0)
HCT: 39.6 % (ref 39.0–52.0)
Hemoglobin: 13.5 g/dL (ref 13.0–17.0)
LYMPHS ABS: 2 10*3/uL (ref 0.7–4.0)
Lymphocytes Relative: 40.3 % (ref 12.0–46.0)
MCHC: 34 g/dL (ref 30.0–36.0)
MCV: 89.8 fl (ref 78.0–100.0)
MONO ABS: 0.3 10*3/uL (ref 0.1–1.0)
Monocytes Relative: 6.1 % (ref 3.0–12.0)
NEUTROS ABS: 2.5 10*3/uL (ref 1.4–7.7)
NEUTROS PCT: 49.3 % (ref 43.0–77.0)
PLATELETS: 187 10*3/uL (ref 150.0–400.0)
RBC: 4.41 Mil/uL (ref 4.22–5.81)
RDW: 13.3 % (ref 11.5–15.5)
WBC: 5 10*3/uL (ref 4.0–10.5)

## 2017-09-28 LAB — HEPATIC FUNCTION PANEL
ALBUMIN: 4.2 g/dL (ref 3.5–5.2)
ALK PHOS: 50 U/L (ref 39–117)
ALT: 19 U/L (ref 0–53)
AST: 14 U/L (ref 0–37)
BILIRUBIN DIRECT: 0.1 mg/dL (ref 0.0–0.3)
Total Bilirubin: 0.7 mg/dL (ref 0.2–1.2)
Total Protein: 6.4 g/dL (ref 6.0–8.3)

## 2017-09-28 LAB — BASIC METABOLIC PANEL
BUN: 16 mg/dL (ref 6–23)
CALCIUM: 9.3 mg/dL (ref 8.4–10.5)
CHLORIDE: 103 meq/L (ref 96–112)
CO2: 29 meq/L (ref 19–32)
CREATININE: 0.96 mg/dL (ref 0.40–1.50)
GFR: 84.69 mL/min (ref >60.00)
GLUCOSE: 88 mg/dL (ref 70–99)
Potassium: 4.4 mEq/L (ref 3.5–5.1)
Sodium: 138 mEq/L (ref 135–145)

## 2017-09-28 LAB — LIPID PANEL
CHOLESTEROL: 167 mg/dL (ref 0–200)
HDL: 48.5 mg/dL (ref >39.00)
LDL Cholesterol: 92 mg/dL (ref 0–99)
NonHDL: 118.64
Total CHOL/HDL Ratio: 3
Triglycerides: 134 mg/dL (ref 0.0–149.0)
VLDL: 26.8 mg/dL (ref 0.0–40.0)

## 2017-09-28 LAB — PSA: PSA: 0.53 ng/mL (ref 0.10–4.00)

## 2017-09-28 NOTE — Patient Instructions (Addendum)
Stay hydrated as discussed .  See if that helps  Fatigue .  Exam is good today  consider compression stockings when up and around .    shingles vaccine. Shingrix  Can receive either in the office or pharmacy depending on insurance rules.        Preventive Care 40-64 Years, Male Preventive care refers to lifestyle choices and visits with your health care provider that can promote health and wellness. What does preventive care include?  A yearly physical exam. This is also called an annual well check.  Dental exams once or twice a year.  Routine eye exams. Ask your health care provider how often you should have your eyes checked.  Personal lifestyle choices, including: ? Daily care of your teeth and gums. ? Regular physical activity. ? Eating a healthy diet. ? Avoiding tobacco and drug use. ? Limiting alcohol use. ? Practicing safe sex. ? Taking low-dose aspirin every day starting at age 64. What happens during an annual well check? The services and screenings done by your health care provider during your annual well check will depend on your age, overall health, lifestyle risk factors, and family history of disease. Counseling Your health care provider may ask you questions about your:  Alcohol use.  Tobacco use.  Drug use.  Emotional well-being.  Home and relationship well-being.  Sexual activity.  Eating habits.  Work and work Statistician.  Screening You may have the following tests or measurements:  Height, weight, and BMI.  Blood pressure.  Lipid and cholesterol levels. These may be checked every 5 years, or more frequently if you are over 61 years old.  Skin check.  Lung cancer screening. You may have this screening every year starting at age 56 if you have a 30-pack-year history of smoking and currently smoke or have quit within the past 15 years.  Fecal occult blood test (FOBT) of the stool. You may have this test every year starting at age  8.  Flexible sigmoidoscopy or colonoscopy. You may have a sigmoidoscopy every 5 years or a colonoscopy every 10 years starting at age 64.  Prostate cancer screening. Recommendations will vary depending on your family history and other risks.  Hepatitis C blood test.  Hepatitis B blood test.  Sexually transmitted disease (STD) testing.  Diabetes screening. This is done by checking your blood sugar (glucose) after you have not eaten for a while (fasting). You may have this done every 1-3 years.  Discuss your test results, treatment options, and if necessary, the need for more tests with your health care provider. Vaccines Your health care provider may recommend certain vaccines, such as:  Influenza vaccine. This is recommended every year.  Tetanus, diphtheria, and acellular pertussis (Tdap, Td) vaccine. You may need a Td booster every 10 years.  Varicella vaccine. You may need this if you have not been vaccinated.  Zoster vaccine. You may need this after age 80.  Measles, mumps, and rubella (MMR) vaccine. You may need at least one dose of MMR if you were born in 1957 or later. You may also need a second dose.  Pneumococcal 13-valent conjugate (PCV13) vaccine. You may need this if you have certain conditions and have not been vaccinated.  Pneumococcal polysaccharide (PPSV23) vaccine. You may need one or two doses if you smoke cigarettes or if you have certain conditions.  Meningococcal vaccine. You may need this if you have certain conditions.  Hepatitis A vaccine. You may need this if you have certain  conditions or if you travel or work in places where you may be exposed to hepatitis A.  Hepatitis B vaccine. You may need this if you have certain conditions or if you travel or work in places where you may be exposed to hepatitis B.  Haemophilus influenzae type b (Hib) vaccine. You may need this if you have certain risk factors.  Talk to your health care provider about which  screenings and vaccines you need and how often you need them. This information is not intended to replace advice given to you by your health care provider. Make sure you discuss any questions you have with your health care provider. Document Released: 01/04/2016 Document Revised: 08/27/2016 Document Reviewed: 10/09/2015 Elsevier Interactive Patient Education  2017 [employer].

## 2017-10-07 ENCOUNTER — Encounter: Payer: Self-pay | Admitting: *Deleted

## 2017-10-13 ENCOUNTER — Telehealth: Payer: Self-pay | Admitting: Internal Medicine

## 2017-10-13 NOTE — Telephone Encounter (Signed)
Results reviewed with patient, expressed understanding. Nothing further needed.   Notes recorded by Burnis Medin, MD on 09/28/2017 at 5:21 PM EDT Blood work results are good . Cholesterol much better than last year  Yearly check up Lab work  Or if  Ongoing concerns

## 2017-11-17 ENCOUNTER — Telehealth: Payer: Self-pay | Admitting: Internal Medicine

## 2017-11-17 ENCOUNTER — Other Ambulatory Visit: Payer: Self-pay | Admitting: Internal Medicine

## 2017-11-17 MED ORDER — SIMVASTATIN 40 MG PO TABS: 40.0000 mg | ORAL_TABLET | Freq: Every day | ORAL | 3 refills | Status: DC

## 2017-11-17 NOTE — Telephone Encounter (Signed)
Copied from [place]. Topic: Quick Communication - See Telephone Encounter >> Nov 17, 2017  8:47 AM Arletha Grippe wrote: CRM for notification. See Telephone encounter for:   11/17/17. Wife called - she states that pt needs refill for simvastatin (ZOCOR) 40 MG tablet. She states he needs it to go to [facility], that this pharmacy was used last year.  Wife also states that she called the pharmacy aready and that the rx was not renewed for this year.   Wife call back number is [phone]

## 2017-11-17 NOTE — Telephone Encounter (Signed)
LM for patient making aware that Rx's have been sent to pharmacy. Spoke with patient wife, made aware.  Nothing further needed.

## 2017-11-17 NOTE — Telephone Encounter (Signed)
Medication refill

## 2017-12-03 DIAGNOSIS — J209 Acute bronchitis, unspecified: Secondary | ICD-10-CM | POA: Diagnosis not present

## 2018-02-10 ENCOUNTER — Ambulatory Visit (INDEPENDENT_AMBULATORY_CARE_PROVIDER_SITE_OTHER): Payer: 59 | Admitting: Family Medicine

## 2018-02-10 ENCOUNTER — Encounter: Payer: Self-pay | Admitting: Family Medicine

## 2018-02-10 VITALS — BP 110/80 | HR 78 | Temp 98.2°F | Wt 194.3 lb

## 2018-02-10 DIAGNOSIS — K5900 Constipation, unspecified: Secondary | ICD-10-CM

## 2018-02-10 NOTE — Progress Notes (Signed)
Subjective:     Patient ID: Jeremy Wolf, male   DOB: 1956-08-11, 62 y.o.   MRN: [id number]  HPI Patient seen with chief complaint of constipation. He's had tendencies toward constipation in past. He states he did not have bowel movement since Sunday. Prior to that he was having very small pellet-like stools.  He has had some mild left lower quadrant pain. No history of known diverticulitis. Colonoscopy November 2015 and no mention of diverticulosis change. No fevers or chills. Has some slight nausea no vomiting. No bloody stools.  He apparently drinks about 8-9 cups of coffee per day and sometimes sweetened tea but very little water intake. Is not apparently eating a lot of fiber. Previous TSH levels have been normal. Does not take any anticholinergic or otherwise constipating medications.  Past Medical History:  Diagnosis Date  . Allergic rhinitis   . Allergy   . Atrial fibrillation ([place]) 02/10/2009   Centricity Description: FIBRILLATION, ATRIAL Qualifier: Diagnosis of  By: Percival Spanish, MD, Farrel Gordon   Centricity Description: ATRIAL FIBRILLATION Qualifier: Diagnosis of  By: Sarajane Jews MD, Ishmael Holter   . Blood transfusion without reported diagnosis 2010   after heart surgery  . CAD (coronary artery disease)    a. cath 12/09: oD1 50%, EF 65%;  b. negative ETT 7/11  . Cataract   . History of atrial fibrillation    post op AFib  . Hx of colonic polyps   . Hyperlipidemia   . Mitral valve prolapse    a. with severe regurgitation; s/p MV repair 2/10;  b. 07/2011 Echo: EF 50-55%, mild MR.  . Nephrolithiasis    hx of  . Patent foramen ovale    a. s/p closure at MV repair in 2010  . Plantar fasciitis of left foot 07/06/2012  . Premature birth ?    twin  was in hosp 30 days feeding lazy eye small weight   . Sleep apnea    Past Surgical History:  Procedure Laterality Date  . HERNIA REPAIR Right 2012   Inguinal 3 years ago  . PATENT FORAMEN OVALE CLOSURE  2010  . SPINE SURGERY  12/09/2010   vertebra 5 & 6  . THORACOTOMY  2010   Rt miniature for mitral valve repair, quadrangular resection of posterior leaftlet with transposition of  native chordae tendineae x 2 and 93mm Edwards Physio II ring annuloplasty    reports that  has never smoked. he has never used smokeless tobacco. He reports that he drinks alcohol. He reports that he does not use drugs. family history includes Arthritis in his paternal grandfather and paternal grandmother; Cancer (age of onset: 47) in his daughter; Diabetes in his mother; Emphysema in his maternal grandfather; Heart disease in his father; Hyperlipidemia in his father and mother; Lung cancer in his maternal grandfather; Other in his sister; Rheumatic fever in his unknown relative. No Known Allergies   Review of Systems  Constitutional: Negative for chills, fever and unexpected weight change.  HENT: Negative for sore throat.   Respiratory: Negative for shortness of breath.   Gastrointestinal: Positive for abdominal pain, constipation and nausea. Negative for blood in stool, diarrhea and vomiting.  Genitourinary: Negative for dysuria.       Objective:   Physical Exam  Constitutional: He appears well-developed and well-nourished.  Cardiovascular: Normal rate and regular rhythm.  Pulmonary/Chest: Effort normal and breath sounds normal. No respiratory distress. He has no wheezes. He has no rales.  Abdominal: Soft. Bowel sounds are normal. He exhibits no  distension and no mass. There is no rebound and no guarding.  Patient does have some mild tenderness left lower quadrant to deep palpation. No guarding or rebound.  Genitourinary:  Genitourinary Comments: Rectal exam no impaction. He does have some hard stool to palpation just barely within reach of digit. No masses palpated otherwise       Assessment:     Constipation. He does have some mild left lower quadrant abdominal pain likely related to constipation. He has no history of known diverticulosis.  Does not have any fever or other red flags for abdominal infection    Plan:     -Discussed measures to reduce constipation. Gradually reduce caffeine intake. Increase water intake -Increase fiber intake to 25-30 g daily -Consider short-term use of glycerin suppository and MiraLAX -Touch base if constipation not improving over the next few days -Increased walking and upright activities as tolerated  Eulas Post MD [facility]

## 2018-02-10 NOTE — Patient Instructions (Addendum)
Constipation, Adult Constipation is when a person has fewer bowel movements in a week than normal, has difficulty having a bowel movement, or has stools that are dry, hard, or larger than normal. Constipation may be caused by an underlying condition. It may become worse with age if a person takes certain medicines and does not take in enough fluids. Follow these instructions at home: Eating and drinking   Eat foods that have a lot of fiber, such as fresh fruits and vegetables, whole grains, and beans.  Limit foods that are high in fat, low in fiber, or overly processed, such as french fries, hamburgers, cookies, candies, and soda.  Drink enough fluid to keep your urine clear or pale yellow. General instructions  Exercise regularly or as told by your health care provider.  Go to the restroom when you have the urge to go. Do not hold it in.  Take over-the-counter and prescription medicines only as told by your health care provider. These include any fiber supplements.  Practice pelvic floor retraining exercises, such as deep breathing while relaxing the lower abdomen and pelvic floor relaxation during bowel movements.  Watch your condition for any changes.  Keep all follow-up visits as told by your health care provider. This is important. Contact a health care provider if:  You have pain that gets worse.  You have a fever.  You do not have a bowel movement after 4 days.  You vomit.  You are not hungry.  You lose weight.  You are bleeding from the anus.  You have thin, pencil-like stools. Get help right away if:  You have a fever and your symptoms suddenly get worse.  You leak stool or have blood in your stool.  Your abdomen is bloated.  You have severe pain in your abdomen.  You feel dizzy or you faint. This information is not intended to replace advice given to you by your health care provider. Make sure you discuss any questions you have with your health care  provider. Document Released: 09/05/2004 Document Revised: 06/27/2016 Document Reviewed: 05/28/2016 Elsevier Interactive Patient Education  2018 [employer].  Drink more water Gradually reduce caffeine use Try to get 25- 30 grams of fiber per day- fruits, vegetables, high fiber cereals Consider OTC Miralax once or twice daily as needed Consider OTC glycerin suppository.

## 2018-02-17 ENCOUNTER — Encounter: Payer: Self-pay | Admitting: Adult Health

## 2018-02-17 ENCOUNTER — Ambulatory Visit (INDEPENDENT_AMBULATORY_CARE_PROVIDER_SITE_OTHER): Payer: 59 | Admitting: Adult Health

## 2018-02-17 ENCOUNTER — Encounter: Payer: Self-pay | Admitting: Family Medicine

## 2018-02-17 VITALS — BP 130/78 | HR 88 | Temp 98.1°F | Wt 195.0 lb

## 2018-02-17 DIAGNOSIS — I499 Cardiac arrhythmia, unspecified: Secondary | ICD-10-CM

## 2018-02-17 DIAGNOSIS — J069 Acute upper respiratory infection, unspecified: Secondary | ICD-10-CM

## 2018-02-17 MED ORDER — DOXYCYCLINE HYCLATE 100 MG PO CAPS: 100.0000 mg | ORAL_CAPSULE | Freq: Two times a day (BID) | ORAL | 0 refills | Status: DC

## 2018-02-17 MED ORDER — METHYLPREDNISOLONE 4 MG PO TBPK: ORAL_TABLET | ORAL | 0 refills | Status: DC

## 2018-02-17 NOTE — Progress Notes (Signed)
Subjective:    Patient ID: Jeremy Wolf, male    DOB: [birth year], 62 y.o.   MRN: [id number]  Cough  Associated symptoms include headaches, a sore throat and wheezing. Pertinent negatives include no ear pain or shortness of breath.  Headache   Associated symptoms include coughing, sinus pressure and a sore throat. Pertinent negatives include no ear pain.  Sore Throat   Associated symptoms include congestion, coughing and headaches. Pertinent negatives include no ear discharge, ear pain or shortness of breath.    Presents with 5 day history of nasal congestion, sinus pain and pressure which has now moved more into his chest with productive cough.  He has noticed some wheezing.  He denies fever but has had some chills.  He has had some lightheadedness with coughing and fatigue. He reports feeling like his heart is pounding harder than usual. He has been using OTC Mucinex (plain) and Advil cold and sinus. Overall he feels like he has not improved.   Review of Systems  HENT: Positive for congestion, sinus pressure, sinus pain and sore throat. Negative for ear discharge and ear pain.   Respiratory: Positive for cough and wheezing. Negative for chest tightness and shortness of breath.   Cardiovascular: Negative.   Neurological: Positive for headaches.   Past Medical History:  Diagnosis Date  . Allergic rhinitis   . Allergy   . Atrial fibrillation ([place]) 02/10/2009   Centricity Description: FIBRILLATION, ATRIAL Qualifier: Diagnosis of  By: Percival Spanish, MD, Farrel Gordon   Centricity Description: ATRIAL FIBRILLATION Qualifier: Diagnosis of  By: Sarajane Jews MD, Ishmael Holter   . Blood transfusion without reported diagnosis 2010   after heart surgery  . CAD (coronary artery disease)    a. cath 12/09: oD1 50%, EF 65%;  b. negative ETT 7/11  . Cataract   . History of atrial fibrillation    post op AFib  . Hx of colonic polyps   . Hyperlipidemia   . Mitral valve prolapse    a. with severe regurgitation; s/p MV  repair 2/10;  b. 07/2011 Echo: EF 50-55%, mild MR.  . Nephrolithiasis    hx of  . Patent foramen ovale    a. s/p closure at MV repair in 2010  . Plantar fasciitis of left foot 07/06/2012  . Premature birth ?    twin  was in hosp 30 days feeding lazy eye small weight   . Sleep apnea     Social History   Socioeconomic History  . Marital status: Married    Spouse name: Not on file  . Number of children: Not on file  . Years of education: Not on file  . Highest education level: Not on file  Social Needs  . Financial resource strain: Not on file  . Food insecurity - worry: Not on file  . Food insecurity - inability: Not on file  . Transportation needs - medical: Not on file  . Transportation needs - non-medical: Not on file  Occupational History  . Not on file  Tobacco Use  . Smoking status: Never Smoker  . Smokeless tobacco: Never Used  Substance and Sexual Activity  . Alcohol use: Yes    Alcohol/week: 0.0 oz    Comment: OCC. WINE  . Drug use: No  . Sexual activity: Not on file  Other Topics Concern  . 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 HERNIA REPAIR Right 2012   Inguinal 3 years ago  . PATENT FORAMEN OVALE CLOSURE  2010  . SPINE SURGERY  12/09/2010   vertebra 5 & 6  . THORACOTOMY  2010   Rt miniature for mitral valve repair, quadrangular resection of posterior leaftlet with transposition of  native chordae tendineae x 2 and 84mm Edwards Physio II ring annuloplasty    Family History  Problem Relation Age of Onset  . Diabetes Mother   . Hyperlipidemia Mother   .  Hyperlipidemia Father   . Heart disease Father        heart surgery 84 ? cabd and valve  . Cancer Daughter 38       ovarian  . Lung cancer Maternal Grandfather        lung  . Emphysema Maternal Grandfather   . Arthritis Paternal Grandmother   . Arthritis Paternal Grandfather   . Rheumatic fever Unknown        2 sisters  . Other Sister        twin hx of substance use  . Colon cancer Neg Hx     No Known Allergies  Current Outpatient Medications on File Prior to Visit  Medication Sig Dispense Refill  . aspirin 81 MG EC tablet Take 81 mg by mouth daily.      . Misc Natural Products (RED WINE EXTRACT PO) Take 1 tablet by mouth daily.     . simvastatin (ZOCOR) 40 MG tablet Take 1 tablet (40 mg total) by mouth at bedtime. 90 tablet 3   No current facility-administered medications on file prior to visit.     BP 130/78   Pulse 88   Temp 98.1 F (36.7 C) (Oral)   Wt 195 lb (88.5 kg)   SpO2 98%   BMI 28.80 kg/m     Objective:   Physical Exam  Constitutional: He is oriented to person, place, and time. He appears well-developed and well-nourished. No distress.  HENT:  Right Ear: Tympanic membrane normal.  Left Ear: Tympanic membrane normal.  Nose: Mucosal edema present. No rhinorrhea. Right sinus exhibits no maxillary sinus tenderness and no frontal sinus tenderness. Left sinus exhibits no maxillary sinus tenderness and no frontal sinus tenderness.  Mouth/Throat: Uvula is midline, oropharynx is clear and moist and mucous membranes are normal. No oropharyngeal exudate, posterior oropharyngeal edema or posterior oropharyngeal erythema.  Nasal turbinates are erythematous  Cardiovascular: Normal rate, normal heart sounds and intact distal pulses. An irregular rhythm present. Exam reveals no gallop and no friction rub.  No murmur heard. Pulmonary/Chest: Effort normal. No respiratory distress. He has wheezes. He has no rales.  Expiratory wheezes throughout right lung field.  Breath  sounds clear with cough.   Lymphadenopathy:       Head (right side): No submental, no submandibular, no tonsillar, no preauricular and no posterior auricular adenopathy present.       Head (left side): No submental, no submandibular, no tonsillar, no preauricular and no posterior auricular adenopathy present.    He has no cervical adenopathy.  Neurological: He is  alert and oriented to person, place, and time.  Skin: Skin is warm. He is diaphoretic.  Nursing note and vitals reviewed.     Assessment & Plan:  1. Atrial fibrillation, unspecified type ([place]) EKG shows Sinus rhythm with PACs (likely Mobitz I) consistent with previous ekg's.  Rate is 93. - EKG 12-Lead  Continue Mucinex for cough.  Encourage in crease in fluids.  May use tylenol or motrin for fever or discomfort.  Return instructions given to patient.   Doxycycline 100 mg PO BID for 7 days.  Please take full course. Prednisone dose pak-take as directed.  Shaquile Lutze C Yoona Ishii BSN RN NP student

## 2018-02-17 NOTE — Progress Notes (Signed)
Subjective:    Patient ID: Jeremy Wolf, male    DOB: 06/24/1956, 62 y.o.   MRN: [id number]  URI   This is a new problem. The current episode started in the past 7 days (5 days ). The problem has been gradually worsening. There has been no fever. Associated symptoms include congestion, coughing (productive ), headaches, sinus pain and wheezing. Pertinent negatives include no abdominal pain, diarrhea, ear pain, nausea, sore throat or vomiting. Treatments tried: mucinex  The treatment provided mild relief.    Review of Systems  Constitutional: Positive for activity change, appetite change, chills, diaphoresis and fatigue. Negative for fever.  HENT: Positive for congestion, sinus pressure and sinus pain. Negative for ear pain and sore throat.   Respiratory: Positive for cough (productive ) and wheezing.   Cardiovascular: Negative.   Gastrointestinal: Negative for abdominal pain, diarrhea, nausea and vomiting.  Musculoskeletal: Positive for myalgias.  Neurological: Positive for light-headedness and headaches.   Past Medical History:  Diagnosis Date  . Allergic rhinitis   . Allergy   . Atrial fibrillation ([place]) 02/10/2009   Centricity Description: FIBRILLATION, ATRIAL Qualifier: Diagnosis of  By: Percival Spanish, MD, Farrel Gordon   Centricity Description: ATRIAL FIBRILLATION Qualifier: Diagnosis of  By: Sarajane Jews MD, Ishmael Holter   . Blood transfusion without reported diagnosis 2010   after heart surgery  . CAD (coronary artery disease)    a. cath 12/09: oD1 50%, EF 65%;  b. negative ETT 7/11  . Cataract   . History of atrial fibrillation    post op AFib  . Hx of colonic polyps   . Hyperlipidemia   . Mitral valve prolapse    a. with severe regurgitation; s/p MV repair 2/10;  b. 07/2011 Echo: EF 50-55%, mild MR.  . Nephrolithiasis    hx of  . Patent foramen ovale    a. s/p closure at MV repair in 2010  . Plantar fasciitis of left foot 07/06/2012  . Premature birth ?    twin  was in hosp 30 days  feeding lazy eye small weight   . Sleep apnea     Social History   Socioeconomic History  . Marital status: Married    Spouse name: Not on file  . Number of children: Not on file  . Years of education: Not on file  . Highest education level: Not on file  Social Needs  . Financial resource strain: Not on file  . Food insecurity - worry: Not on file  . Food insecurity - inability: Not on file  . Transportation needs - medical: Not on file  . Transportation needs - non-medical: Not on file  Occupational History  . Not on file  Tobacco Use  . Smoking status: Never Smoker  . Smokeless tobacco: Never Used  Substance and Sexual Activity  . Alcohol use: Yes    Alcohol/week: 0.0 oz    Comment: OCC. WINE  . Drug use: No  . Sexual activity: Not on file  Other Topics Concern  . 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 HERNIA REPAIR Right 2012   Inguinal 3 years ago  . PATENT FORAMEN OVALE CLOSURE  2010  . SPINE SURGERY  12/09/2010   vertebra 5 & 6  . THORACOTOMY  2010   Rt miniature for mitral valve repair, quadrangular resection of posterior leaftlet with transposition of  native chordae tendineae x 2 and 26mm Edwards Physio II ring annuloplasty    Family History  Problem Relation Age of Onset  . Diabetes Mother   . Hyperlipidemia Mother   . Hyperlipidemia Father   . Heart disease Father        heart surgery 84 ? cabd and valve  . Cancer Daughter 44       ovarian  . Lung cancer Maternal Grandfather        lung  . Emphysema Maternal Grandfather   . Arthritis Paternal  Grandmother   . Arthritis Paternal Grandfather   . Rheumatic fever Unknown        2 sisters  . Other Sister        twin hx of substance use  . Colon cancer Neg Hx     No Known Allergies  Current Outpatient Medications on File Prior to Visit  Medication Sig Dispense Refill  . aspirin 81 MG EC tablet Take 81 mg by mouth daily.      . Misc Natural Products (RED WINE EXTRACT PO) Take 1 tablet by mouth daily.     . simvastatin (ZOCOR) 40 MG tablet Take 1 tablet (40 mg total) by mouth at bedtime. 90 tablet 3   No current facility-administered medications on file prior to visit.     BP 130/78   Pulse 88   Temp 98.1 F (36.7 C) (Oral)   Wt 195 lb (88.5 kg)   SpO2 98%   BMI 28.80 kg/m       Objective:   Physical Exam  Constitutional: He is oriented to person, place, and time. He appears well-developed and well-nourished. No distress.  HENT:  Head: Normocephalic and atraumatic.  Right Ear: External ear normal.  Left Ear: External ear normal.  Nose: Nose normal.  Mouth/Throat: Oropharynx is clear and moist. No oropharyngeal exudate.  Eyes: Conjunctivae and EOM are normal. Pupils are equal, round, and reactive to light. Right eye exhibits no discharge. Left eye exhibits no discharge. No scleral icterus.  Neck: Normal range of motion. Neck supple.  Cardiovascular: Regular rhythm, normal heart sounds and intact distal pulses. Frequent extrasystoles are present. Exam reveals no gallop and no friction rub.  No murmur heard. Frequent PAC's   Pulmonary/Chest: Effort normal. No respiratory distress. He has wheezes (clear with cough ). He has no rales. He exhibits no tenderness.  Abdominal: Soft. Bowel sounds are normal. He exhibits no distension and no mass. There is no tenderness. There is no rebound and no guarding.  Musculoskeletal: Normal range of motion. He exhibits no edema, tenderness or deformity.  Neurological: He is alert and oriented to person, place, and time.  Skin: Skin is  warm. No rash noted. He is diaphoretic. No erythema. No pallor.  Psychiatric: He has a normal mood and affect. His behavior is normal. Judgment and thought content normal.  Nursing note and vitals reviewed.     Assessment & Plan:  1. Upper respiratory tract infection, unspecified type - Continue with Mucinex for cough  - Stay hydrated and rest - Follow up if no improvement  in the next 2-3 days or sooner if symptoms worsen  - doxycycline (VIBRAMYCIN) 100 MG capsule; Take 1 capsule (100 mg total) by mouth 2 (two) times daily.  Dispense: 14 capsule; Refill: 0 - methylPREDNISolone (MEDROL DOSEPAK) 4 MG TBPK tablet; Take as directed  Dispense: 21 tablet; Refill: 0  2. Cardiac arrhythmia, unspecified cardiac arrhythmia type  - EKG 12-Lead - Sinus  Rhythm  - frequent PAC s  # PACs = 3. WITHIN NORMAL LIMITS ( possible Mobitz), rate 93. Consistent with previous EKGs  Dorothyann Peng, NP

## 2018-06-29 DIAGNOSIS — L821 Other seborrheic keratosis: Secondary | ICD-10-CM | POA: Diagnosis not present

## 2018-06-29 DIAGNOSIS — B354 Tinea corporis: Secondary | ICD-10-CM | POA: Diagnosis not present

## 2018-06-30 NOTE — Progress Notes (Signed)
Chief Complaint  Patient presents with  . Flank Pain    left    HPI: Jeremy Wolf 62 y.o. come in for left abd pain upper and lower    Had similar sx  In feb  But not as persistent  aso with constipation   But has been become    persistent  And   progressive  Left side pain and constipation   recnet pebble type of  Stool     ? After eating feeling left puffy luq and uncomfortable. And strech back relieves pressure and left hip comes and goes.  Irritable.   .  Seen    ?  2 20    Comes and goes  .    ROS: See pertinent positives and negatives per HPI.  low energy at times   Worked late    ( physical   ) .    Daughter thought she was pale.  No fever vomiting unintentional weight loss    Last colon 2015 polyp due 5 year recall   No sob rare mid chest pain  Hx of pectus as a child  No uti hematuria sx  No bleeding  Past Medical History:  Diagnosis Date  . Allergic rhinitis   . Allergy   . Atrial fibrillation ([place]) 02/10/2009   Centricity Description: FIBRILLATION, ATRIAL Qualifier: Diagnosis of  By: Percival Spanish, MD, Farrel Gordon   Centricity Description: ATRIAL FIBRILLATION Qualifier: Diagnosis of  By: Sarajane Jews MD, Ishmael Holter   . Blood transfusion without reported diagnosis 2010   after heart surgery  . CAD (coronary artery disease)    a. cath 12/09: oD1 50%, EF 65%;  b. negative ETT 7/11  . Cataract   . History of atrial fibrillation    post op AFib  . Hx of colonic polyps   . Hyperlipidemia   . Mitral valve prolapse    a. with severe regurgitation; s/p MV repair 2/10;  b. 07/2011 Echo: EF 50-55%, mild MR.  . Nephrolithiasis    hx of  . Patent foramen ovale    a. s/p closure at MV repair in 2010  . Plantar fasciitis of left foot 07/06/2012  . Premature birth ?    twin  was in hosp 30 days feeding lazy eye small weight   . Sleep apnea     Family History  Problem Relation Age of Onset  . Diabetes Mother   . Hyperlipidemia Mother   . Hyperlipidemia Father   . Heart disease Father       heart surgery 84 ? cabd and valve  . Cancer Daughter 51       ovarian  . Lung cancer Maternal Grandfather        lung  . Emphysema Maternal Grandfather   . Arthritis Paternal Grandmother   . Arthritis Paternal Grandfather   . Rheumatic fever Unknown        2 sisters  . Other Sister        twin hx of substance use  . Colon cancer Neg Hx     Social History   Socioeconomic History  . Marital status: Married    Spouse name: Not on file  . Number of children: Not on file  . Years of education: Not on file  . Highest education level: Not on file  Occupational History  . Not on file  Social Needs  . Financial resource strain: Not on file  . Food insecurity:    Worry: Not on file  Inability: Not on file  . Transportation needs:    Medical: Not on file    Non-medical: Not on file  Tobacco Use  . Smoking status: Never Smoker  . Smokeless tobacco: Never Used  Substance and Sexual Activity  . Alcohol use: Yes    Alcohol/week: 0.0 oz    Comment: OCC. WINE  . Drug use: No  . Sexual activity: Not on file  Lifestyle  . Physical activity:    Days per week: Not on file    Minutes per session: Not on file  . Stress: Not on file  Relationships  . Social connections:    Talks on phone: Not on file    Gets together: Not on file    Attends religious service: Not on file    Active member of club or organization: Not on file    Attends meetings of clubs or organizations: Not on file    Relationship status: Not on file  Other Topics Concern  . 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 aspirin EC 81 MG tablet Take 81 mg by mouth daily.    . simvastatin (ZOCOR) 40 MG tablet Take 1 tablet (40 mg total) by mouth at bedtime. 90 tablet 3  . aspirin 81 MG EC tablet Take 81 mg by mouth daily.      Marland Kitchen doxycycline (VIBRAMYCIN) 100 MG capsule Take 1 capsule (100 mg total) by mouth 2 (two) times daily. (Patient not taking: Reported on 07/01/2018) 14 capsule 0  . methylPREDNISolone (MEDROL DOSEPAK) 4 MG TBPK tablet Take as directed (Patient not taking: Reported on 07/01/2018) 21 tablet 0  . Misc Natural Products (RED WINE EXTRACT PO) Take 1 tablet by mouth daily.      No facility-administered medications prior to visit.      EXAM:  BP (!) 142/87   Pulse 73   Temp 97.9 F (36.6 C)   Wt 194 lb (88 kg)   BMI 28.65 kg/m   Body mass index is 28.65 kg/m.  GENERAL: vitals reviewed and listed above, alert, oriented, appears well hydrated and in no acute distress HEENT: atraumatic, conjunctiva  clear, no obvious abnormalities on inspection of external nose and ears  NECK: no obvious masses on inspection palpation  LUNGS: clear to auscultation bilaterally, no wheezes, rales or rhonchi, good air movement CV: HRRR, ocass skipped beat  no clubbing cyanosis or  peripheral edema nl cap refill  Abdomen:  Sof,t normal bowel sounds without hepatosplenomegaly, no guarding rebound or masses no CVA tenderness  Pints to llq and luq as area if concern but nl bs and no massses or rebound  MS: moves all extremities without noticeable focal  abnormality PSYCH: pleasant and cooperative, no obvious depression or anxiety  BP Readings from Last 3 Encounters:  07/01/18 (!) 142/87  02/17/18 130/78  02/10/18 110/80    ASSESSMENT AND PLAN:  Discussed the following assessment and plan:  Intermittent left lower  quadrant abdominal pain  Hyperlipidemia, unspecified hyperlipidemia type - Plan: Basic metabolic panel, CBC with Differential/Platelet, Hepatic function  panel, TSH, POCT Urinalysis Dipstick (Automated), Lipid panel, T4, free  Medication management - Plan: Basic metabolic panel, CBC with Differential/Platelet, Hepatic function panel, TSH, POCT Urinalysis Dipstick (Automated), Lipid panel, T4, free  Constipation, unspecified constipation type - Plan: Basic metabolic panel, CBC with Differential/Platelet, Hepatic function panel, TSH, POCT Urinalysis Dipstick (Automated), Lipid panel, T4, free  Change in bowel habit - Plan: Basic metabolic panel, CBC with Differential/Platelet, Hepatic function panel, TSH, POCT Urinalysis Dipstick (Automated), Lipid panel, T4, free  Intermittent left upper quadrant abdominal pain Plan lab and referral to gi update change in bowel habits   Of uncertain cause  Fu bp to make sure controlled  And sign up for my chart may help   Plan cpx in fall  dending on results  Pt out of town for a week (   94 yo mom b day  in [place]) in hospice for  Heart  Problems  -Patient advised to return or notify health care team  if  new concerns arise.  Patient Instructions   Lab today . To check for anemia .   Thyroid etc .  Add miralax   1 cap per day until  stools loose .   And soft and then as needed .   Plan  Gi referral to evaluate and may include   A colonoscopy..  poss imaging  Of abdomen but your exam is reassuring today .    Increase  Fiber  Also in the diet .   Check your Blood pressure readings for 5 days in a row and send in readings  Goal is 120/80   But below 140/90  And  If not then plan medication intervention.            Standley Brooking. Jammy Plotkin M.D.

## 2018-07-01 ENCOUNTER — Ambulatory Visit (INDEPENDENT_AMBULATORY_CARE_PROVIDER_SITE_OTHER): Payer: 59 | Admitting: Internal Medicine

## 2018-07-01 ENCOUNTER — Encounter: Payer: Self-pay | Admitting: Internal Medicine

## 2018-07-01 VITALS — BP 142/87 | HR 73 | Temp 97.9°F | Wt 194.0 lb

## 2018-07-01 DIAGNOSIS — R1012 Left upper quadrant pain: Secondary | ICD-10-CM

## 2018-07-01 DIAGNOSIS — Z79899 Other long term (current) drug therapy: Secondary | ICD-10-CM

## 2018-07-01 DIAGNOSIS — R194 Change in bowel habit: Secondary | ICD-10-CM

## 2018-07-01 DIAGNOSIS — K5900 Constipation, unspecified: Secondary | ICD-10-CM | POA: Insufficient documentation

## 2018-07-01 DIAGNOSIS — E785 Hyperlipidemia, unspecified: Secondary | ICD-10-CM

## 2018-07-01 DIAGNOSIS — R1032 Left lower quadrant pain: Secondary | ICD-10-CM | POA: Diagnosis not present

## 2018-07-01 LAB — BASIC METABOLIC PANEL
BUN: 20 mg/dL (ref 6–23)
CALCIUM: 9.6 mg/dL (ref 8.4–10.5)
CO2: 28 meq/L (ref 19–32)
Chloride: 103 mEq/L (ref 96–112)
Creatinine, Ser: 1.02 mg/dL (ref 0.40–1.50)
GFR: 78.77 mL/min (ref >60.00)
GLUCOSE: 100 mg/dL — AB (ref 70–99)
Potassium: 5 mEq/L (ref 3.5–5.1)
Sodium: 139 mEq/L (ref 135–145)

## 2018-07-01 LAB — POC URINALSYSI DIPSTICK (AUTOMATED)
BILIRUBIN UA: NEGATIVE
Blood, UA: NEGATIVE
Glucose, UA: NEGATIVE
KETONES UA: NEGATIVE
LEUKOCYTES UA: NEGATIVE
NITRITE UA: NEGATIVE
PH UA: 6 (ref 5.0–8.0)
Protein, UA: NEGATIVE
SPEC GRAV UA: 1.02 (ref 1.010–1.025)
UROBILINOGEN UA: 0.2 U/dL

## 2018-07-01 LAB — HEPATIC FUNCTION PANEL
ALT: 23 U/L (ref 0–53)
AST: 20 U/L (ref 0–37)
Albumin: 4.4 g/dL (ref 3.5–5.2)
Alkaline Phosphatase: 60 U/L (ref 39–117)
BILIRUBIN TOTAL: 0.9 mg/dL (ref 0.2–1.2)
Bilirubin, Direct: 0.2 mg/dL (ref 0.0–0.3)
Total Protein: 6.9 g/dL (ref 6.0–8.3)

## 2018-07-01 LAB — CBC WITH DIFFERENTIAL/PLATELET
BASOS ABS: 0 10*3/uL (ref 0.0–0.1)
Basophils Relative: 0.7 % (ref 0.0–3.0)
EOS PCT: 3.3 % (ref 0.0–5.0)
Eosinophils Absolute: 0.2 10*3/uL (ref 0.0–0.7)
HCT: 41.9 % (ref 39.0–52.0)
Hemoglobin: 14.4 g/dL (ref 13.0–17.0)
Lymphocytes Relative: 45.2 % (ref 12.0–46.0)
Lymphs Abs: 2.5 10*3/uL (ref 0.7–4.0)
MCHC: 34.4 g/dL (ref 30.0–36.0)
MCV: 87.6 fl (ref 78.0–100.0)
MONO ABS: 0.4 10*3/uL (ref 0.1–1.0)
Monocytes Relative: 7.7 % (ref 3.0–12.0)
NEUTROS ABS: 2.4 10*3/uL (ref 1.4–7.7)
NEUTROS PCT: 43.1 % (ref 43.0–77.0)
PLATELETS: 216 10*3/uL (ref 150.0–400.0)
RBC: 4.78 Mil/uL (ref 4.22–5.81)
RDW: 13.3 % (ref 11.5–15.5)
WBC: 5.6 10*3/uL (ref 4.0–10.5)

## 2018-07-01 LAB — LIPID PANEL
CHOL/HDL RATIO: 4
Cholesterol: 206 mg/dL — ABNORMAL HIGH (ref 0–200)
HDL: 56 mg/dL (ref >39.00)
LDL CALC: 129 mg/dL — AB (ref 0–99)
NONHDL: 150.16
Triglycerides: 104 mg/dL (ref 0.0–149.0)
VLDL: 20.8 mg/dL (ref 0.0–40.0)

## 2018-07-01 LAB — T4, FREE: FREE T4: 0.81 ng/dL (ref 0.60–1.60)

## 2018-07-01 LAB — TSH: TSH: 1.74 u[IU]/mL (ref 0.35–4.50)

## 2018-07-01 NOTE — Patient Instructions (Addendum)
  Lab today . To check for anemia .   Thyroid etc .  Add miralax   1 cap per day until  stools loose .   And soft and then as needed .   Plan  Gi referral to evaluate and may include   A colonoscopy..  poss imaging  Of abdomen but your exam is reassuring today .    Increase  Fiber  Also in the diet .   Check your Blood pressure readings for 5 days in a row and send in readings  Goal is 120/80   But below 140/90  And  If not then plan medication intervention.

## 2018-07-13 ENCOUNTER — Telehealth: Payer: Self-pay | Admitting: Internal Medicine

## 2018-07-13 NOTE — Telephone Encounter (Signed)
Copied from [place] [id number]. Topic: Quick Communication - Lab Results >> Jul 07, 2018  1:29 PM Nimmons, Emilio Math, RN wrote: Called patient to inform them of lab results. When patient returns call, triage nurse may disclose results. >> Jul 13, 2018 12:52 PM Bea Graff, NT wrote: Pt calling back to get lab results.

## 2018-07-13 NOTE — Telephone Encounter (Signed)
Noted in result notes. 

## 2018-07-20 ENCOUNTER — Encounter: Payer: Self-pay | Admitting: Physician Assistant

## 2018-07-20 ENCOUNTER — Ambulatory Visit (INDEPENDENT_AMBULATORY_CARE_PROVIDER_SITE_OTHER): Payer: 59 | Admitting: Physician Assistant

## 2018-07-20 VITALS — BP 112/70 | HR 62 | Ht 69.0 in | Wt 194.0 lb

## 2018-07-20 DIAGNOSIS — R1032 Left lower quadrant pain: Secondary | ICD-10-CM

## 2018-07-20 DIAGNOSIS — R109 Unspecified abdominal pain: Secondary | ICD-10-CM

## 2018-07-20 DIAGNOSIS — R194 Change in bowel habit: Secondary | ICD-10-CM

## 2018-07-20 DIAGNOSIS — K5900 Constipation, unspecified: Secondary | ICD-10-CM

## 2018-07-20 NOTE — Progress Notes (Signed)
Thank you for sending this case to me. I have reviewed the entire note, and the outlined plan seems appropriate.  Perhaps diverticulosis.  Agree that if CT unrevealing, may need colonoscopy.   Wilfrid Lund, MD

## 2018-07-20 NOTE — Patient Instructions (Signed)
Take Miralax 17 grams in 8 oz of water daily. Add Fiber supplement daily- Metamucil or Benefiber.  Drink 60-70 pz of water daily.   You have been scheduled for a CT scan of the abdomen and pelvis at [facility] ([address]---this is in the same building as Press [facility]).   You are scheduled on 08-03-2018 at 3:00 PM. You should arrive at 2:45 PM to your appointment time for registration. Please follow the written instructions below on the day of your exam:  WARNING: IF YOU ARE ALLERGIC TO IODINE/X-RAY DYE, PLEASE NOTIFY RADIOLOGY IMMEDIATELY AT [phone]! YOU WILL BE GIVEN A 13 HOUR PREMEDICATION PREP.  1) Do not eat  anything after 11:00 am (4 hours prior to your test) 2) You have been given 2 bottles of oral contrast to drink. The solution may taste better if refrigerated, but do NOT add ice or any other liquid to this solution. Shake well before drinking.    Drink 1 bottle of contrast @ 1:00 PM (2 hours prior to your exam)  Drink 1 bottle of contrast @ 2:00 PM (1 hour prior to your exam)  You may take any medications as prescribed with a small amount of water except for the following: Metformin, Glucophage, Glucovance, Avandamet, Riomet, Fortamet, Actoplus Met, Janumet, Glumetza or Metaglip. The above medications must be held the day of the exam AND 48 hours after the exam.  The purpose of you drinking the oral contrast is to aid in the visualization of your intestinal tract. The contrast solution may cause some diarrhea. Before your exam is started, you will be given a small amount of fluid to drink. Depending on your individual set of symptoms, you may also receive an intravenous injection of x-ray contrast/dye. Plan on being at [facility] for 30 minutes or long, depending on the type of exam you are having performed.  If you have any questions regarding your exam or if you need to reschedule, you may call the CT department at [phone] between the hours  of 8:00 am and 5:00 pm, Monday-Friday.   If you are age 7 or younger, your body mass index should be between 19-25. Your Body mass index is 28.65 kg/m. If this is out of the aformentioned range listed, please consider follow up with your Primary Care Provider.    ________________________________________________________________________

## 2018-07-20 NOTE — Progress Notes (Signed)
Subjective:    Patient ID: Jeremy Wolf, male    DOB: 01/20/1956, 62 y.o.   MRN: [id number]  HPI Terion is a pleasant 62 year old white male, known previously to Dr. Deatra Ina who was last seen in our office in November 2015 when he had colonoscopy.  This was done for follow-up of adenomatous colon polyps.  He was found to have one 3 mm polyp in the rectum which was removed and path showed this to be hyperplastic.  Exam was otherwise negative and 10-year interval follow-up was recommended. Patient has history of hyperlipidemia, he is status post PFO closure in 2010 and is had a right inguinal hernia repair.  Line He says his current symptoms started about 6 months ago.  He says prior to that he was always very regular and never had any issues with his bowels.  He has developed constipation and is only able to have a bowel movement a couple of times per week.  He says he is passing pebble-like stools associated with a lot of straining he says it onset of the symptoms he actually had to disimpact himself at one point.  He has developed some left-sided abdominal pain which he describes is aching in nature that comes and goes over the past couple of months.  He also gets a pain in his left upper quadrant which she says is more of a bloated gassy type of feeling under his ribs which is sometimes relieved by stretching out.  Appetite has been okay, weight has been stable.  He has not had any changes in his medications, diet supplement activities etc.  He denies any heartburn or indigestion.  He does not think he has seen any blood in his stool. Labs were done on 07/01/2018 by his PCP, with normal TSH, normal LFTs and normal CBC.   Review of Systems Pertinent positive and negative review of systems were noted in the above HPI section.  All other review of systems was otherwise negative.  Outpatient Encounter Medications as of 07/20/2018  Medication Sig  . aspirin EC 81 MG tablet Take 81 mg by mouth daily.  Marland Kitchen  RESVERATROL PO Take by mouth.  . simvastatin (ZOCOR) 40 MG tablet Take 1 tablet (40 mg total) by mouth at bedtime.   No facility-administered encounter medications on file as of 07/20/2018.    No Known Allergies Patient Active Problem List   Diagnosis Date Noted  . Constipation 07/01/2018  . AV block, [place] 1 09/07/2017  . Dizziness 05/08/2017  . Bradycardia 05/08/2017  . Visit for preventive health examination 07/24/2014  . Hx of colonic polyps   . Visual disturbances 07/19/2013  . Transient visual disturbance, bilateral 07/19/2013  . Plantar fasciitis of left foot 07/06/2012  . S/P mitral valve repair 07/10/2011  . Preventative health care 07/09/2011  . Mitral valve prolapse   . Nephrolithiasis   . OBSTRUCTIVE SLEEP APNEA 12/13/2009  . HYPERSOMNIA 10/24/2009  . TESTICULAR HYPOFUNCTION 10/14/2009  . SNORING 10/12/2009  . LIBIDO, DECREASED 10/12/2009  . WEIGHT GAIN, ABNORMAL 04/25/2009  . MITRAL REGURGITATION 02/10/2009  . PATENT FORAMEN OVALE 02/10/2009  . HEARTBURN 11/06/2008  . HYPERLIPIDEMIA 03/06/2008  . ERECTILE DYSFUNCTION 03/06/2008  . MITRAL VALVE PROLAPSE 03/06/2008  . Allergic rhinitis 03/06/2008  . FATIGUE 03/06/2008  . HEADACHE 03/06/2008  . CARDIAC MURMUR 03/06/2008  . CHEST PAIN 03/06/2008  . NEPHROLITHIASIS, HX OF 03/06/2008   Social History   Socioeconomic History  . Marital status: Married    Spouse name: Not on  file  . Number of children: 5  . Years of education: Not on file  . Highest education level: Not on file  Occupational History  . Occupation: [employer] operational officer  . Financial resource strain: Not on file  . Food insecurity:    Worry: Not on file    Inability: Not on file  . Transportation needs:    Medical: Not on file    Non-medical: Not on file  Tobacco Use  . Smoking status: Never Smoker  . Smokeless tobacco: Never Used  Substance and Sexual Activity  . Alcohol use: Yes    Alcohol/week: 0.0 oz    Comment: OCC. WINE    . Drug use: No  . Sexual activity: Not on file  Lifestyle  . Physical activity:    Days per week: Not on file    Minutes per session: Not on file  . Stress: Not on file  Relationships  . Social connections:    Talks on phone: Not on file    Gets together: Not on file    Attends religious service: Not on file    Active member of club or organization: Not on file    Attends meetings of clubs or organizations: Not on file    Relationship status: Not on file  . Intimate partner violence:    Fear of current or ex partner: Not on file    Emotionally abused: Not on file    Physically abused: Not on file    Forced sexual activity: Not on file  Other Topics Concern  . 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 Marchuk family history includes Arthritis in his paternal grandfather and paternal grandmother; Cancer (age of onset: 41) in his daughter; Diabetes in his mother; Emphysema in his maternal grandfather; Heart disease in his father; Hyperlipidemia in his father and mother; Lung cancer in his maternal grandfather; Other in his sister; Rheumatic fever in his unknown relative.      Objective:    Vitals:   07/20/18 0823  BP: 112/70  Pulse: 62    Physical Exam; well-developed white male in no acute distress, pleasant blood pressure 112/70 pulse 62, height 5 foot 9, weight 194, BMI 28.6.  HEENT; nontraumatic normocephalic EOMI PERRLA sclera anicteric oropharynx benign, Cardiovascular ;regular rate and rhythm with S1-S2 no murmur rub or gallop.  Pulmonary; clear bilaterally, Abdomen ;soft bowel sounds are present, no palpable mass or  hepatosplenomegaly he does have tenderness in the left mid and left lower quadrant no guarding or rebound.  Rectal ;exam not done, Extremities; no clubbing cyanosis or edema skin warm and dry, Neuro psych; alert and oriented, mood and affect appropriate grossly nonfocal       Assessment & Plan:   #84 62 year old white male with 18-month history of significant change in bowel habits with constipation and left-sided abdominal pain primarily left lower quadrant, but is also getting an intermittent left upper quadrant pain described more as a bloating type sensation. Etiology of symptoms is  not clear, rule out functional constipation, rule out occult colon lesion, rule out diverticular disease, rule out other intra-abdominal inflammatory process  #2 remote history of adenomatous colon polyp on colonoscopy 2010, follow-up colonoscopy November 2015 with finding of one 3 mm hyperplastic polyp and otherwise negative exam. #3 hyperlipidemia #4.  Status post right inguinal hernia repair #5.  Status post PFO closure 2010  Plan; patient will be scheduled for CT scan of the abdomen and pelvis with contrast. Start MiraLAX 17 g in 8 ounces of water on a daily basis.  If this is not producing regular bowel movements increase to twice daily dosing. Patient advised to push water intake. Patient will be established with Dr. Loletha Carrow. If CT scan is unrevealing and symptoms are persisting, he will need  Colonoscopy.  Hilda Rynders S Arafat Cocuzza PA-C 07/20/2018   Cc: Burnis Medin, MD

## 2018-08-03 ENCOUNTER — Ambulatory Visit (INDEPENDENT_AMBULATORY_CARE_PROVIDER_SITE_OTHER)
Admission: RE | Admit: 2018-08-03 | Discharge: 2018-08-03 | Disposition: A | Discharge status: Home / Self Care | Payer: 59 | Source: Ambulatory Visit | Attending: Physician Assistant | Admitting: Physician Assistant

## 2018-08-03 DIAGNOSIS — R1032 Left lower quadrant pain: Secondary | ICD-10-CM

## 2018-08-03 DIAGNOSIS — R109 Unspecified abdominal pain: Secondary | ICD-10-CM | POA: Diagnosis not present

## 2018-08-03 DIAGNOSIS — K5900 Constipation, unspecified: Secondary | ICD-10-CM

## 2018-08-03 DIAGNOSIS — R194 Change in bowel habit: Secondary | ICD-10-CM

## 2018-08-03 MED ORDER — IOPAMIDOL (ISOVUE-300) INJECTION 61%
100.0000 mL | Freq: Once | INTRAVENOUS | Status: AC | PRN
  Administered 2018-08-03: 100 mL via INTRAVENOUS

## 2018-08-18 ENCOUNTER — Encounter: Payer: Self-pay | Admitting: Gastroenterology

## 2018-10-01 ENCOUNTER — Encounter: Payer: Self-pay | Admitting: Gastroenterology

## 2018-10-01 ENCOUNTER — Ambulatory Visit (AMBULATORY_SURGERY_CENTER): Payer: Self-pay | Admitting: *Deleted

## 2018-10-01 VITALS — Ht 69.0 in | Wt 200.0 lb

## 2018-10-01 DIAGNOSIS — R1032 Left lower quadrant pain: Secondary | ICD-10-CM

## 2018-10-01 DIAGNOSIS — K5900 Constipation, unspecified: Secondary | ICD-10-CM

## 2018-10-01 MED ORDER — PEG-KCL-NACL-NASULF-NA ASC-C 140 G PO SOLR: 1.0000 | ORAL | 0 refills | Status: DC

## 2018-10-01 NOTE — Progress Notes (Signed)
No egg or soy allergy known to patient  No issues with past sedation with any surgeries  or procedures, no intubation problems  No diet pills per patient No home 02 use per patient  No blood thinners per patient  Pt denies issues with constipation  No A fib or A flutter  EMMI video sent to pt's e mail  Plenvu coupon to pt today in [place]  Hx constipation- will do a 2 day Plenvu prep for colon 10-25

## 2018-10-15 ENCOUNTER — Ambulatory Visit (AMBULATORY_SURGERY_CENTER): Payer: 59 | Admitting: Gastroenterology

## 2018-10-15 ENCOUNTER — Encounter: Payer: Self-pay | Admitting: Gastroenterology

## 2018-10-15 VITALS — BP 108/73 | HR 70 | Temp 98.2°F | Resp 17 | Ht 69.0 in | Wt 200.0 lb

## 2018-10-15 DIAGNOSIS — D123 Benign neoplasm of transverse colon: Secondary | ICD-10-CM

## 2018-10-15 DIAGNOSIS — K5900 Constipation, unspecified: Secondary | ICD-10-CM

## 2018-10-15 DIAGNOSIS — R1032 Left lower quadrant pain: Secondary | ICD-10-CM

## 2018-10-15 MED ORDER — SODIUM CHLORIDE 0.9 % IV SOLN: 500.0000 mL | Freq: Once | INTRAVENOUS | Status: DC

## 2018-10-15 NOTE — Op Note (Signed)
Patient Name: Jeremy Wolf Procedure Date: 10/15/2018 1:24 PM MRN: [id number] Endoscopist: Mallie Mussel L. Loletha Carrow , MD Age: 62 Referring MD:  Date of Birth: 1956-03-12 Gender: Male Account #: [id number] Procedure:                Colonoscopy Indications:              Abdominal pain in the left lower quadrant,                            Constipation Medicines:                Monitored Anesthesia Care Procedure:                Pre-Anesthesia Assessment:                           - Prior to the procedure, a History and Physical                            was performed, and patient medications and                            allergies were reviewed. The patient's tolerance of                            previous anesthesia was also reviewed. The risks                            and benefits of the procedure and the sedation                            options and risks were discussed with the patient.                            All questions were answered, and informed consent                            was obtained. Anticoagulants: The patient has taken                            aspirin. It was decided not to withhold this                            medication prior to the procedure. ASA Grade                            Assessment: III - A patient with severe systemic                            disease. After reviewing the risks and benefits,                            the patient was deemed in satisfactory condition to  undergo the procedure.                           After obtaining informed consent, the colonoscope                            was passed under direct vision. Throughout the                            procedure, the patient's blood pressure, pulse, and                            oxygen saturations were monitored continuously. The                            Colonoscope was introduced through the anus and   advanced to the the cecum, identified by                            appendiceal orifice and ileocecal valve. The                            colonoscopy was performed without difficulty. The                            patient tolerated the procedure well. The quality                            of the bowel preparation was excellent. The                            ileocecal valve, appendiceal orifice, and rectum                            were photographed. The quality of the bowel                            preparation was evaluated using the BBPS [facility]                            Bowel Preparation Scale) with scores of: Right                            Colon = 3, Transverse Colon = 3 and Left Colon = 3                            (entire mucosa seen well with no residual staining,                            small fragments of stool or opaque liquid). The                            total BBPS score equals 9. Scope In: 1:39:07 PM Scope Out: 1:53:05 PM Scope Withdrawal Time:  0 hours 9 minutes 57 seconds  Total Procedure Duration: 0 hours 13 minutes 58 seconds  Findings:                 The perianal and digital rectal examinations were                            normal.                           A 4 mm polyp was found in the transverse colon. The                            polyp was sessile. The polyp was removed with a                            cold snare. Resection and retrieval were complete.                           The exam was otherwise without abnormality on                            direct and retroflexion views. Complications:            No immediate complications. Estimated Blood Loss:     Estimated blood loss was minimal. Impression:               - One 4 mm polyp in the transverse colon, removed                            with a cold snare. Resected and retrieved.                           - The examination was otherwise normal on direct                            and  retroflexion views. Recommendation:           - Patient has a contact number available for                            emergencies. The signs and symptoms of potential                            delayed complications were discussed with the                            patient. Return to normal activities tomorrow.                            Written discharge instructions were provided to the                            patient.                           - Resume  previous diet.                           - Continue present medications, including miralax                            1/2 capful in 8 ounces liquid every other day,                            increasing dose and/or frequency as needed.                           - High fiber diet, plenty of water and regular                            exercise.                           - Await pathology results.                           - Repeat colonoscopy is recommended for                            surveillance. The colonoscopy date will be                            determined after pathology results from today's                            exam become available for review. Marquise Lambson L. Loletha Carrow, MD 10/15/2018 1:57:52 PM This report has been signed electronically.

## 2018-10-15 NOTE — Progress Notes (Signed)
Called to room to assist during endoscopic procedure.  Patient ID and intended procedure confirmed with present staff. Received instructions for my participation in the procedure from the performing physician.  

## 2018-10-15 NOTE — Progress Notes (Signed)
PT taken to PACU. Monitors in place. VSS. Report given to RN. 

## 2018-10-15 NOTE — Progress Notes (Signed)
Pt's states no medical or surgical changes since previsit or office visit. 

## 2018-10-15 NOTE — Patient Instructions (Signed)
Handouts:  Polyps and High-fiber info.  YOU HAD AN ENDOSCOPIC PROCEDURE TODAY AT [facility]:   Refer to the procedure report that was given to you for any specific questions about what was found during the examination.  If the procedure report does not answer your questions, please call your gastroenterologist to clarify.  If you requested that your care partner not be given the details of your procedure findings, then the procedure report has been included in a sealed envelope for you to review at your convenience later.  YOU SHOULD EXPECT: Some feelings of bloating in the abdomen. Passage of more gas than usual.  Walking can help get rid of the air that was put into your GI tract during the procedure and reduce the bloating. If you had a lower endoscopy (such as a colonoscopy or flexible sigmoidoscopy) you may notice spotting of blood in your stool or on the toilet paper. If you underwent a bowel prep for your procedure, you may not have a normal bowel movement for a few days.  Please Note:  You might notice some irritation and congestion in your nose or some drainage.  This is from the oxygen used during your procedure.  There is no need for concern and it should clear up in a day or so.  SYMPTOMS TO REPORT IMMEDIATELY:   Following lower endoscopy (colonoscopy or flexible sigmoidoscopy):  Excessive amounts of blood in the stool  Significant tenderness or worsening of abdominal pains  Swelling of the abdomen that is new, acute  Fever of 100F or higher  For urgent or emergent issues, a gastroenterologist can be reached at any hour by calling [phone].   DIET:  We do recommend a small meal at first, but then you may proceed to your regular diet.  Drink plenty of fluids but you should avoid alcoholic beverages for 24 hours.  ACTIVITY:  You should plan to take it easy for the rest of today and you should NOT DRIVE or use heavy machinery until tomorrow (because of the  sedation medicines used during the test).    FOLLOW UP: Our staff will call the number listed on your records the next business day following your procedure to check on you and address any questions or concerns that you may have regarding the information given to you following your procedure. If we do not reach you, we will leave a message.  However, if you are feeling well and you are not experiencing any problems, there is no need to return our call.  We will assume that you have returned to your regular daily activities without incident.  If any biopsies were taken you will be contacted by phone or by letter within the next 1-3 weeks.  Please call us at [phone] if you have not heard about the biopsies in 3 weeks.    SIGNATURES/CONFIDENTIALITY: You and/or your care partner have signed paperwork which will be entered into your electronic medical record.  These signatures attest to the fact that that the information above on your After Visit Summary has been reviewed and is understood.  Full responsibility of the confidentiality of this discharge information lies with you and/or your care-partner.

## 2018-10-18 ENCOUNTER — Telehealth: Payer: Self-pay

## 2018-10-18 ENCOUNTER — Encounter: Payer: 59 | Admitting: Internal Medicine

## 2018-10-18 NOTE — Progress Notes (Signed)
Chief Complaint  Patient presents with  . Annual Exam    Pt was told not to fast today. NO new concerns    HPI: Patient  Jeremy Wolf  63 y.o. comes in today for Preventive Health Care visit  And fu lipidpamangement  ahs been o simva for a while trying lsi also   Had colon for change in bowel habids and screen  One polyp path pending   No cv sx  X sme lef swelling end of day  Has ( vv)   Health Maintenance  Topic Date Due  . HIV Screening  12/05/1971  . INFLUENZA VACCINE  07/22/2018  . TETANUS/TDAP  07/20/2023  . COLONOSCOPY  10/15/2028  . Hepatitis C Screening  Completed   Health Maintenance Review LIFESTYLE:  Exercise:   somewalking  Tobacco/ETS:n Alcohol:  ocass Sugar beverages: y dr pepper  And  peach tea 2 per day  Sleep:ok Drug use: no  Work: truck International aid/development worker  Feeling his age   ROS:  Some swelling leg at end of lday vv   Feet down no cp sob  GEN/ HEENT: No fever, significant weight changes sweats headaches vision problems hearing changes, CV/ PULM; No chest pain shortness of breath cough, syncope,  change in exercise tolerance. GI /GU: No adominal pain, vomiting, change in bowel habits. No blood in the stool. No significant GU symptoms. SKIN/HEME: ,no acute skin rashes suspicious lesions or bleeding. No lymphadenopathy, nodules, masses.  NEURO/ PSYCH:  No neurologic signs such as weakness numbness. No depression anxiety. IMM/ Allergy: No unusual infections.  Allergy .   REST of 12 system review negative except as per HPI   Past Medical History:  Diagnosis Date  . Allergic rhinitis   . Allergy   . Atrial fibrillation ([place]) 02/10/2009   Centricity Description: FIBRILLATION, ATRIAL Qualifier: Diagnosis of  By: Percival Spanish, MD, Farrel Gordon   Centricity Description: ATRIAL FIBRILLATION Qualifier: Diagnosis of  By: Sarajane Jews MD, Ishmael Holter   . Blood transfusion without reported diagnosis 2010   after heart surgery  . CAD (coronary artery disease)    a. cath  12/09: oD1 50%, EF 65%;  b. negative ETT 7/11  . Cataract    removed left eye with lens implant   . Constipation    uses Miralax  . History of atrial fibrillation    post op AFib  . Hx of colonic polyps   . Hyperlipidemia   . Mitral valve prolapse    a. with severe regurgitation; s/p MV repair 2/10;  b. 07/2011 Echo: EF 50-55%, mild MR.  . Nephrolithiasis    hx of  . Patent foramen ovale    a. s/p closure at MV repair in 2010  . Plantar fasciitis of left foot 07/06/2012  . Premature birth ?    twin  was in hosp 30 days feeding lazy eye small weight   . Rheumatic fever with heart involvement    MVP per pt due to this with repair 2010  . Sleep apnea    no cpap    Past Surgical History:  Procedure Laterality Date  . COLONOSCOPY    . HERNIA REPAIR Right 2012   Inguinal 3 years ago  . PATENT FORAMEN OVALE CLOSURE  2010  . POLYPECTOMY    . SPINE SURGERY  12/09/2010   vertebra 5 & 6  . THORACOTOMY  2010   Rt miniature for mitral valve repair, quadrangular resection of posterior leaftlet with transposition of  native chordae tendineae x 2 and 42m Edwards Physio II ring annuloplasty    Family History  Problem Relation Age of Onset  . Diabetes Mother   . Hyperlipidemia Mother   . Hyperlipidemia Father   . Heart disease Father        heart surgery 84 ? cabd and valve  . Cancer Daughter 271      ovarian  . Lung cancer Maternal Grandfather        lung  . Emphysema Maternal Grandfather   . Arthritis Paternal Grandmother   . Arthritis Paternal Grandfather   . Rheumatic fever Unknown        2 sisters  . Other Sister        twin hx of substance use  . Colon cancer Neg Hx   . Colon polyps Neg Hx   . Esophageal cancer Neg Hx   . Rectal cancer Neg Hx   . Stomach cancer Neg Hx     Social History   Socioeconomic History  . Marital status: Married    Spouse name: Not on file  . Number of children: 5  . Years of education: Not on file  . Highest education level: Not on  file  Occupational History  . Occupation: Truck DDiplomatic Services operational officer . Financial resource strain: Not on file  . Food insecurity:    Worry: Not on file    Inability: Not on file  . Transportation needs:    Medical: Not on file    Non-medical: Not on file  Tobacco Use  . Smoking status: Never Smoker  . Smokeless tobacco: Never Used  Substance and Sexual Activity  . Alcohol use: Yes    Alcohol/week: 0.0 standard drinks    Comment: OCC. WINE  . Drug use: No  . Sexual activity: Not on file  Lifestyle  . Physical activity:    Days per week: Not on file    Minutes per session: Not on file  . Stress: Not on file  Relationships  . Social connections:    Talks on phone: Not on file    Gets together: Not on file    Attends religious service: Not on file    Active member of club or organization: Not on file    Attends meetings of clubs or organizations: Not on file    Relationship status: Not on file  Other Topics Concern  . 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 aspirin EC 81 MG tablet Take 81 mg by mouth daily.    .Marland Kitchenketoconazole (NIZORAL) 2 % cream APPLY TO AFFECTED AREA TWICE A DAY  1  . Misc Natural Products (RED WINE EXTRACT) CAPS Take by mouth.    . simvastatin (ZOCOR) 40 MG tablet Take 1 tablet (40 mg total) by mouth at bedtime. 90 tablet 3  . RESVERATROL PO Take by mouth.     No facility-administered medications prior to visit.  EXAM:  BP 122/80 (BP Location: Right Arm, Patient Position: Sitting, Cuff Size: Normal)   Pulse 81   Temp 97.7 F  (36.5 C) (Oral)   Ht _0  (1.753 m)   Wt 195 lb 14.4 oz (88.9 kg)   BMI 28.93 kg/m   Body mass index is 28.93 kg/m. Wt Readings from Last 3 Encounters:  10/19/18 195 lb 14.4 oz (88.9 kg)  10/15/18 200 lb (90.7 kg)  10/01/18 200 lb (90.7 kg)    Physical Exam: Vital signs reviewed KDT:OIZT is a well-developed well-nourished alert cooperative    who appearsr stated age in no acute distress.  HEENT: normocephalic atraumatic , Eyes: PERRL EOM's full, conjunctiva clear, Nares: paten,t no deformity discharge or tenderness., Ears: no deformity EAC's clear TMs with normal landmarks. Mouth: clear OP, no lesions, edema.  Moist mucous membranes. Dentition in adequate repair. NECK: supple without masses, thyromegaly or bruits. CHEST/PULM:  Clear to auscultation and percussion breath sounds equal no wheeze , rales or rhonchi. No chest wall deformities or tenderness.. CV: PMI is nondisplaced, S1 S2 no gallops, murmurs, rubs. Peripheral pulses are full without delay.No JVD .  ABDOMEN: Bowel sounds normal nontender  No guard or rebound, no hepato splenomegal no CVA tenderness.  Extremtities:  No clubbing cyanosis or edema, no acute joint swelling or redness no focal atrophy NEURO:  Oriented x3, cranial nerves 3-12 appear to be intact, no obvious focal weakness,gait within normal limits no abnormal reflexes or asymmetrical SKIN: No acute rashes normal turgor, color, no bruising or petechiae. PSYCH: Oriented, good eye contact, no obvious depression anxiety, cognition and judgment appear normal. LN: no cervical axillary inguinal adenopathy revewied lab and ct etc with pt  Lab Results  Component Value Date   WBC 5.6 07/01/2018   HGB 14.4 07/01/2018   HCT 41.9 07/01/2018   PLT 216.0 07/01/2018   GLUCOSE 100 (H) 07/01/2018   CHOL 206 (H) 07/01/2018   TRIG 104.0 07/01/2018   HDL 56.00 07/01/2018   LDLDIRECT 154.7 07/12/2013   LDLCALC 129 (H) 07/01/2018   ALT 23 07/01/2018   AST 20 07/01/2018   NA  139 07/01/2018   K 5.0 07/01/2018   CL 103 07/01/2018   CREATININE 1.02 07/01/2018   BUN 20 07/01/2018   CO2 28 07/01/2018   TSH 1.74 07/01/2018   PSA 0.53 09/28/2017   INR 2.0 05/23/2009   HGBA1C  01/19/2009    5.2 (NOTE)   The ADA recommends the following therapeutic goal for glycemic   control related to Hgb A1C measurement:   Goal of Therapy:   < 7.0% Hgb A1C   Reference: American Diabetes Association: Clinical Practice   Recommendations 2008, Diabetes Care,  2008, 31:(Suppl 1).    BP Readings from Last 3 Encounters:  10/19/18 122/80  10/15/18 108/73  07/20/18 112/70    Lab results reviewed with patient   ASSESSMENT AND PLAN:  Discussed the following assessment and plan:  Visit for preventive health examination  Medication management - Plan: Lipid panel, Hemoglobin A1c  Hyperlipidemia, unspecified hyperlipidemia type - Plan: Lipid panel, Hemoglobin A1c  S/P mitral valve repair  Need for influenza vaccination - Plan: Flu Vaccine QUAD 6+ mos PF IM (Fluarix Quad PF)  Hyperglycemia - Plan: Lipid panel, Hemoglobin A1c psda n sx and  Neg fam hx  Can  Do psa next year  Need  to dec sugars in  diet and  Optimize statin   Therapy  Lab NF today ( Told not to fast  By [facility]) had doritos and CC cookies to grab this am  Polyp path pending  Patient Care Team: Elya Diloreto, Standley Brooking, MD as PCP - General Minus Breeding, MD (Cardiology) Newman Pies, MD (Neurosurgery) Loletha Carrow Kirke Corin, MD as Consulting Physician (Gastroenterology) Patient Instructions  Decrease    sugar in drinks .   To prevent getting diabetes .   choelstesrol check today  And  If not at goal ( below 100 ldl  Best 70) then we should change simvastatint o another medicaiton.  Exam is good.  Get appt with cardiology  You are due for yearly check with them.     Preventive Care 40-64 Years, Male Preventive care refers to lifestyle choices and visits with your health care provider that can promote health and  wellness. What does preventive care include?  A yearly physical exam. This is also called an annual well check.  Dental exams once or twice a year.  Routine eye exams. Ask your health care provider how often you should have your eyes checked.  Personal lifestyle choices, including: ? Daily care of your teeth and gums. ? Regular physical activity. ? Eating a healthy diet. ? Avoiding tobacco and drug use. ? Limiting alcohol use. ? Practicing safe sex. ? Taking low-dose aspirin every day starting at age 4. What happens during an annual well check? The services and screenings done by your health care provider during your annual well check will depend on your age, overall health, lifestyle risk factors, and family history of disease. Counseling Your health care provider may ask you questions about your:  Alcohol use.  Tobacco use.  Drug use.  Emotional well-being.  Home and relationship well-being.  Sexual activity.  Eating habits.  Work and work Statistician.  Screening You may have the following tests or measurements:  Height, weight, and BMI.  Blood pressure.  Lipid and cholesterol levels. These may be checked every 5 years, or more frequently if you are over 60 years old.  Skin check.  Lung cancer screening. You may have this screening every year starting at age 18 if you have a 30-pack-year history of smoking and currently smoke or have quit within the past 15 years.  Fecal occult blood test (FOBT) of the stool. You may have this test every year starting at age 12.  Flexible sigmoidoscopy or colonoscopy. You may have a sigmoidoscopy every 5 years or a colonoscopy every 10 years starting at age 11.  Prostate cancer screening. Recommendations will vary depending on your family history and other risks.  Hepatitis C blood test.  Hepatitis B blood test.  Sexually transmitted disease (STD) testing.  Diabetes screening. This is done by checking your blood sugar  (glucose) after you have not eaten for a while (fasting). You may have this done every 1-3 years.  Discuss your test results, treatment options, and if necessary, the need for more tests with your health care provider. Vaccines Your health care provider may recommend certain vaccines, such as:  Influenza vaccine. This is recommended every year.  Tetanus, diphtheria, and acellular pertussis (Tdap, Td) vaccine. You may need a Td booster every 10 years.  Varicella vaccine. You may need this if you have not been vaccinated.  Zoster vaccine. You may need this after age 11.  Measles, mumps, and rubella (MMR) vaccine. You may need at least one dose of MMR if you were born in 1957 or later. You may also need a second dose.  Pneumococcal 13-valent conjugate (PCV13) vaccine. You  may need this if you have certain conditions and have not been vaccinated.  Pneumococcal polysaccharide (PPSV23) vaccine. You may need one or two doses if you smoke cigarettes or if you have certain conditions.  Meningococcal vaccine. You may need this if you have certain conditions.  Hepatitis A vaccine. You may need this if you have certain conditions or if you travel or work in places where you may be exposed to hepatitis A.  Hepatitis B vaccine. You may need this if you have certain conditions or if you travel or work in places where you may be exposed to hepatitis B.  Haemophilus influenzae type b (Hib) vaccine. You may need this if you have certain risk factors.  Talk to your health care provider about which screenings and vaccines you need and how often you need them. This information is not intended to replace advice given to you by your health care provider. Make sure you discuss any questions you have with your health care provider. Document Released: 01/04/2016 Document Revised: 08/27/2016 Document Reviewed: 10/09/2015 Elsevier Interactive Patient Education  2018 [place]. Madonna Flegal M.D.

## 2018-10-18 NOTE — Telephone Encounter (Signed)
Unable to leave message

## 2018-10-19 ENCOUNTER — Encounter: Payer: Self-pay | Admitting: Internal Medicine

## 2018-10-19 ENCOUNTER — Ambulatory Visit (INDEPENDENT_AMBULATORY_CARE_PROVIDER_SITE_OTHER): Payer: 59 | Admitting: Internal Medicine

## 2018-10-19 VITALS — BP 122/80 | HR 81 | Temp 97.7°F | Ht 69.0 in | Wt 195.9 lb

## 2018-10-19 DIAGNOSIS — R739 Hyperglycemia, unspecified: Secondary | ICD-10-CM | POA: Diagnosis not present

## 2018-10-19 DIAGNOSIS — E785 Hyperlipidemia, unspecified: Secondary | ICD-10-CM | POA: Diagnosis not present

## 2018-10-19 DIAGNOSIS — Z0000 Encounter for general adult medical examination without abnormal findings: Secondary | ICD-10-CM

## 2018-10-19 DIAGNOSIS — Z23 Encounter for immunization: Secondary | ICD-10-CM

## 2018-10-19 DIAGNOSIS — Z9889 Other specified postprocedural states: Secondary | ICD-10-CM

## 2018-10-19 DIAGNOSIS — Z79899 Other long term (current) drug therapy: Secondary | ICD-10-CM | POA: Diagnosis not present

## 2018-10-19 LAB — LIPID PANEL
CHOL/HDL RATIO: 4
Cholesterol: 184 mg/dL (ref 0–200)
HDL: 46.6 mg/dL (ref >39.00)
LDL CALC: 101 mg/dL — AB (ref 0–99)
NONHDL: 137.12
Triglycerides: 182 mg/dL — ABNORMAL HIGH (ref 0.0–149.0)
VLDL: 36.4 mg/dL (ref 0.0–40.0)

## 2018-10-19 LAB — HEMOGLOBIN A1C: HEMOGLOBIN A1C: 5.5 % (ref 4.6–6.5)

## 2018-10-19 NOTE — Patient Instructions (Addendum)
Decrease    sugar in drinks .   To prevent getting diabetes .   choelstesrol check today  And  If not at goal ( below 100 ldl  Best 70) then we should change simvastatint o another medicaiton.  Exam is good.  Get appt with cardiology  You are due for yearly check with them.     Preventive Care 40-64 Years, Male Preventive care refers to lifestyle choices and visits with your health care provider that can promote health and wellness. What does preventive care include?  A yearly physical exam. This is also called an annual well check.  Dental exams once or twice a year.  Routine eye exams. Ask your health care provider how often you should have your eyes checked.  Personal lifestyle choices, including: ? Daily care of your teeth and gums. ? Regular physical activity. ? Eating a healthy diet. ? Avoiding tobacco and drug use. ? Limiting alcohol use. ? Practicing safe sex. ? Taking low-dose aspirin every day starting at age 37. What happens during an annual well check? The services and screenings done by your health care provider during your annual well check will depend on your age, overall health, lifestyle risk factors, and family history of disease. Counseling Your health care provider may ask you questions about your:  Alcohol use.  Tobacco use.  Drug use.  Emotional well-being.  Home and relationship well-being.  Sexual activity.  Eating habits.  Work and work Statistician.  Screening You may have the following tests or measurements:  Height, weight, and BMI.  Blood pressure.  Lipid and cholesterol levels. These may be checked every 5 years, or more frequently if you are over 83 years old.  Skin check.  Lung cancer screening. You may have this screening every year starting at age 23 if you have a 30-pack-year history of smoking and currently smoke or have quit within the past 15 years.  Fecal occult blood test (FOBT) of the stool. You may have this test  every year starting at age 38.  Flexible sigmoidoscopy or colonoscopy. You may have a sigmoidoscopy every 5 years or a colonoscopy every 10 years starting at age 9.  Prostate cancer screening. Recommendations will vary depending on your family history and other risks.  Hepatitis C blood test.  Hepatitis B blood test.  Sexually transmitted disease (STD) testing.  Diabetes screening. This is done by checking your blood sugar (glucose) after you have not eaten for a while (fasting). You may have this done every 1-3 years.  Discuss your test results, treatment options, and if necessary, the need for more tests with your health care provider. Vaccines Your health care provider may recommend certain vaccines, such as:  Influenza vaccine. This is recommended every year.  Tetanus, diphtheria, and acellular pertussis (Tdap, Td) vaccine. You may need a Td booster every 10 years.  Varicella vaccine. You may need this if you have not been vaccinated.  Zoster vaccine. You may need this after age 26.  Measles, mumps, and rubella (MMR) vaccine. You may need at least one dose of MMR if you were born in 1957 or later. You may also need a second dose.  Pneumococcal 13-valent conjugate (PCV13) vaccine. You may need this if you have certain conditions and have not been vaccinated.  Pneumococcal polysaccharide (PPSV23) vaccine. You may need one or two doses if you smoke cigarettes or if you have certain conditions.  Meningococcal vaccine. You may need this if you have certain conditions.  Hepatitis A vaccine. You may need this if you have certain conditions or if you travel or work in places where you may be exposed to hepatitis A.  Hepatitis B vaccine. You may need this if you have certain conditions or if you travel or work in places where you may be exposed to hepatitis B.  Haemophilus influenzae type b (Hib) vaccine. You may need this if you have certain risk factors.  Talk to your health  care provider about which screenings and vaccines you need and how often you need them. This information is not intended to replace advice given to you by your health care provider. Make sure you discuss any questions you have with your health care provider. Document Released: 01/04/2016 Document Revised: 08/27/2016 Document Reviewed: 10/09/2015 Elsevier Interactive Patient Education  [employer].

## 2018-10-21 ENCOUNTER — Encounter: Payer: Self-pay | Admitting: Gastroenterology

## 2018-11-04 ENCOUNTER — Other Ambulatory Visit: Payer: Self-pay | Admitting: Internal Medicine

## 2018-11-12 ENCOUNTER — Ambulatory Visit: Payer: 59 | Admitting: Cardiology

## 2018-11-12 ENCOUNTER — Ambulatory Visit (INDEPENDENT_AMBULATORY_CARE_PROVIDER_SITE_OTHER): Payer: 59 | Admitting: Cardiology

## 2018-11-12 ENCOUNTER — Encounter: Payer: Self-pay | Admitting: Cardiology

## 2018-11-12 VITALS — BP 126/74 | HR 75 | Ht 69.0 in | Wt 200.0 lb

## 2018-11-12 DIAGNOSIS — E782 Mixed hyperlipidemia: Secondary | ICD-10-CM

## 2018-11-12 DIAGNOSIS — N529 Male erectile dysfunction, unspecified: Secondary | ICD-10-CM

## 2018-11-12 DIAGNOSIS — Z9889 Other specified postprocedural states: Secondary | ICD-10-CM | POA: Diagnosis not present

## 2018-11-12 MED ORDER — SILDENAFIL CITRATE 50 MG PO TABS: 50.0000 mg | ORAL_TABLET | Freq: Every day | ORAL | 0 refills | Status: DC | PRN

## 2018-11-12 MED ORDER — ROSUVASTATIN CALCIUM 20 MG PO TABS: 20.0000 mg | ORAL_TABLET | Freq: Every day | ORAL | 3 refills | Status: DC

## 2018-11-12 NOTE — Progress Notes (Signed)
HPI The patient presents for followup of mitral valve repair.  He is done well since I last saw him.  He is not having any palpitations, presyncope or syncope.  He works on a physical job.  He has had some problems with constipation and has had this evaluated.  He is not noticing any shortness of breath, PND or orthopnea.  He said no chest pressure, neck or arm discomfort.  He is had no weight gain or edema.  No Known Allergies  Current Outpatient Medications  Medication Sig Dispense Refill  . aspirin EC 81 MG tablet Take 81 mg by mouth daily.    Marland Kitchen ketoconazole (NIZORAL) 2 % cream APPLY TO AFFECTED AREA TWICE A DAY  1  . Misc Natural Products (RED WINE EXTRACT) CAPS Take by mouth.    . rosuvastatin (CRESTOR) 20 MG tablet Take 1 tablet (20 mg total) by mouth daily. 90 tablet 3  . sildenafil (VIAGRA) 50 MG tablet Take 1 tablet (50 mg total) by mouth daily as needed for erectile dysfunction. 10 tablet 0   No current facility-administered medications for this visit.     Past Medical History:  Diagnosis Date  . Allergic rhinitis   . Allergy   . Atrial fibrillation ([place]) 02/10/2009   Centricity Description: FIBRILLATION, ATRIAL Qualifier: Diagnosis of  By: Percival Spanish, MD, Farrel Gordon   Centricity Description: ATRIAL FIBRILLATION Qualifier: Diagnosis of  By: Sarajane Jews MD, Ishmael Holter   . Blood transfusion without reported diagnosis 2010   after heart surgery  . CAD (coronary artery disease)    a. cath 12/09: oD1 50%, EF 65%;  b. negative ETT 7/11  . Cataract    removed left eye with lens implant   . Constipation    uses Miralax  . History of atrial fibrillation    post op AFib  . Hx of colonic polyps   . Hyperlipidemia   . Mitral valve prolapse    a. with severe regurgitation; s/p MV repair 2/10;  b. 07/2011 Echo: EF 50-55%, mild MR.  . Nephrolithiasis    hx of  . Patent foramen ovale    a. s/p closure at MV repair in 2010  . Plantar fasciitis of left foot 07/06/2012  . Premature birth ?     twin  was in hosp 30 days feeding lazy eye small weight   . Rheumatic fever with heart involvement    MVP per pt due to this with repair 2010  . Sleep apnea    no cpap    Past Surgical History:  Procedure Laterality Date  . COLONOSCOPY    . HERNIA REPAIR Right 2012   Inguinal 3 years ago  . PATENT FORAMEN OVALE CLOSURE  2010  . POLYPECTOMY    . SPINE SURGERY  12/09/2010   vertebra 5 & 6  . THORACOTOMY  2010   Rt miniature for mitral valve repair, quadrangular resection of posterior leaftlet with transposition of  native chordae tendineae x 2 and 23mm Edwards Physio II ring annuloplasty    ROS:  ED, constipation.  Otherwise as stated in the HPI and negative for all other systems.  PHYSICAL EXAM BP 126/74   Pulse 75   Ht 5\' 9"  (1.753 m)   Wt 200 lb (90.7 kg)   BMI 29.53 kg/m   GENERAL:  Well appearing NECK:  No jugular venous distention, waveform within normal limits, carotid upstroke brisk and symmetric, no bruits, no thyromegaly LUNGS:  Clear to auscultation bilaterally CHEST:  Well healed thoracotomy scar. HEART:  PMI not displaced or sustained,S1 and S2 within normal limits, no S3, no S4, no clicks, no rubs, no murmurs ABD:  Flat, positive bowel sounds normal in frequency in pitch, no bruits, no rebound, no guarding, no midline pulsatile mass, no hepatomegaly, no splenomegaly EXT:  2 plus pulses throughout, no edema, no cyanosis no clubbing   Lab Results  Component Value Date   CHOL 184 10/19/2018   TRIG 182.0 (H) 10/19/2018   HDL 46.60 10/19/2018   LDLCALC 101 (H) 10/19/2018   LDLDIRECT 154.7 07/12/2013   EKG: Sinus rhythm, rate 75, axis within normal limits, intervals within within normal limits, premature atrial contractions, no acute ST-T wave changes.  ASSESSMENT AND PLAN  Mitral Valve Repair -  This was stable on echo in June of last year.  No further imaging is indicated this year.  He understands endocrines prophylaxis.   Mobitz I Heart Block - He  did not have more significant bradycardia on a Holter last year.  He is had no symptomatic bradycardia arrhythmias and no further testing is indicated.   ED - I will give him prescription for Viagra  CAD - A decade ago prior to his valve surgery he some mild branch vessel disease so I would manage him aggressively with risk reduction as below.  DYSLIPIDEMIA - I am going to change him to Crestor 20 mg daily and discontinue the statin.  He will have follow-up lipid and liver.

## 2018-11-12 NOTE — Patient Instructions (Signed)
Medication Instructions:  DISCONTINUE Zocor  START Crestor 20mg  take 1 tablet once a day  If you need a refill on your cardiac medications before your next appointment, please call your pharmacy.   Lab work: Your physician recommends that you return for lab work in: 4 months FASTING-LIPID, LFT If you have labs (blood work) drawn today and your tests are completely normal, you will receive your results only by: Marland Kitchen MyChart Message (if you have MyChart) OR . A paper copy in the mail If you have any lab test that is abnormal or we need to change your treatment, we will call you to review the results.  Testing/Procedures: NONE   Follow-Up: At [facility], you and your health needs are our priority.  As part of our continuing mission to provide you with exceptional heart care, we have created designated Provider Care Teams.  These Care Teams include your primary Cardiologist (physician) and Advanced Practice Providers (APPs -  Physician Assistants and Nurse Practitioners) who all work together to provide you with the care you need, when you need it. You will need a follow up appointment in 12 months.  Please call our office 2 months in advance to schedule this appointment.  You may see Dr Minus Breeding or one of the following Advanced Practice Providers on your designated Care Team:   Rosaria Ferries, PA-C . Jory Sims, DNP, ANP  Any Other Special Instructions Will Be Listed Below (If Applicable).

## 2019-05-06 ENCOUNTER — Telehealth: Payer: Self-pay | Admitting: Internal Medicine

## 2019-05-06 DIAGNOSIS — S61412A Laceration without foreign body of left hand, initial encounter: Secondary | ICD-10-CM | POA: Diagnosis not present

## 2019-05-06 NOTE — Telephone Encounter (Signed)
Pt needs pharmacy changed in his chart to [facility].  Fax: [phone]/Ph:[phone].  Pt needs Rosuvastatin sent to new pharmacy.

## 2019-05-19 DIAGNOSIS — L03012 Cellulitis of left finger: Secondary | ICD-10-CM | POA: Diagnosis not present

## 2019-05-19 DIAGNOSIS — S62601A Fracture of unspecified phalanx of left index finger, initial encounter for closed fracture: Secondary | ICD-10-CM | POA: Diagnosis not present

## 2019-05-19 DIAGNOSIS — S61211D Laceration without foreign body of left index finger without damage to nail, subsequent encounter: Secondary | ICD-10-CM | POA: Diagnosis not present

## 2019-06-13 DIAGNOSIS — L300 Nummular dermatitis: Secondary | ICD-10-CM | POA: Diagnosis not present

## 2019-06-13 DIAGNOSIS — L0889 Other specified local infections of the skin and subcutaneous tissue: Secondary | ICD-10-CM | POA: Diagnosis not present

## 2019-07-29 ENCOUNTER — Telehealth: Payer: Self-pay | Admitting: Cardiology

## 2019-07-29 DIAGNOSIS — Z9889 Other specified postprocedural states: Secondary | ICD-10-CM

## 2019-07-29 MED ORDER — ROSUVASTATIN CALCIUM 20 MG PO TABS: 20.0000 mg | ORAL_TABLET | Freq: Every day | ORAL | 1 refills | Status: DC

## 2019-07-29 NOTE — Telephone Encounter (Signed)
Medication sent to pharmacy  

## 2019-07-29 NOTE — Telephone Encounter (Signed)
°*  STAT* If patient is at the pharmacy, call can be transferred to refill team.   1. Which medications need to be refilled? (please list name of each medication and dose if known) rosuvastatin (CRESTOR) 20 MG tablet  2. Which pharmacy/location (including street and city if local pharmacy) is medication to be sent to? [facility] - [place], [place] - [address] AT [address]  3. Do they need a 30 day or 90 day supply? 90 day

## 2019-11-15 NOTE — Progress Notes (Signed)
Chief Complaint  Patient presents with   Annual Exam    HPI: Patient  Jeremy Wolf  63 y.o. comes in today for Preventive Health Care visit .    Newer job highways and Software engineer .   2+ weeks     Colon  Polyp.  Constipation is better. CV doing ok  Sitting more often with new job driving but likes it.   Health Maintenance  Topic Date Due   HIV Screening  12/05/1971   INFLUENZA VACCINE  07/23/2019   TETANUS/TDAP  07/20/2023   COLONOSCOPY  10/16/2023   Hepatitis C Screening  Completed   Health Maintenance Review LIFESTYLE:  Exercise:  Riding .  Tobacco/ETS:  no Alcohol:  no Sugar beverages: ocass  Peach tea  Sleep:  8  Drug use: no HH of  2 2 cats  Work:  60 hours   ROS:  Has ed sx but never got the viagra filled  GEN/ HEENT: No fever, significant weight changes sweats headaches vision problems hearing changes, CV/ PULM; No chest pain shortness of breath cough, syncope,edema  change in exercise tolerance. GI /GU: No adominal pain, vomiting, change in bowel habits. No blood in the stool. No significant GU symptoms. SKIN/HEME: ,no acute skin rashes suspicious lesions or bleeding. No lymphadenopathy, nodules, masses.  NEURO/ PSYCH:  No neurologic signs such as weakness numbness. No depression anxiety. IMM/ Allergy: No unusual infections.  Allergy .   REST of 12 system review negative except as per HPI   Past Medical History:  Diagnosis Date   Allergic rhinitis    Allergy    Atrial fibrillation ([place]) 02/10/2009   Centricity Description: FIBRILLATION, ATRIAL Qualifier: Diagnosis of  By: Percival Spanish, MD, Farrel Gordon   Centricity Description: ATRIAL FIBRILLATION Qualifier: Diagnosis of  By: Sarajane Jews MD, Ishmael Holter    Blood transfusion without reported diagnosis 2010   after heart surgery   CAD (coronary artery disease)    a. cath 12/09: oD1 50%, EF 65%;  b. negative ETT 7/11   Cataract    removed left eye with lens implant    Constipation     uses Miralax   History of atrial fibrillation    post op AFib   Hx of colonic polyps    Hyperlipidemia    Mitral valve prolapse    a. with severe regurgitation; s/p MV repair 2/10;  b. 07/2011 Echo: EF 50-55%, mild MR.   Nephrolithiasis    hx of   Patent foramen ovale    a. s/p closure at MV repair in 2010   Plantar fasciitis of left foot 07/06/2012   Premature birth ?    twin  was in hosp 30 days feeding lazy eye small weight    Rheumatic fever with heart involvement    MVP per pt due to this with repair 2010   Sleep apnea    no cpap    Past Surgical History:  Procedure Laterality Date   COLONOSCOPY     HERNIA REPAIR Right 2012   Inguinal 3 years ago   PATENT FORAMEN OVALE CLOSURE  2010   POLYPECTOMY     SPINE SURGERY  12/09/2010   vertebra 5 & 6   THORACOTOMY  2010   Rt miniature for mitral valve repair, quadrangular resection of posterior leaftlet with transposition of  native chordae tendineae x 2 and 72mm Edwards Physio II ring annuloplasty    Family History  Problem Relation Age of Onset  Diabetes Mother    Hyperlipidemia Mother    Hyperlipidemia Father    Heart disease Father        heart surgery 58 ? cabd and valve   Cancer Daughter 57       ovarian   Lung cancer Maternal Grandfather        lung   Emphysema Maternal Grandfather    Arthritis Paternal Grandmother    Arthritis Paternal Grandfather    Rheumatic fever Unknown        2 sisters   Other Sister        twin hx of substance use   Colon cancer Neg Hx    Colon polyps Neg Hx    Esophageal cancer Neg Hx    Rectal cancer Neg Hx    Stomach cancer Neg Hx     Social History   Socioeconomic History   Marital status: Married    Spouse name: Not on file   Number of children: 5   Years of education: Not on file   Highest education level: Not on file  Occupational History   Occupation: Truck Solicitor strain: Not on file    Food insecurity    Worry: Not on file    Inability: Not on file   Transportation needs    Medical: Not on file    Non-medical: Not on file  Tobacco Use   Smoking status: Never Smoker   Smokeless tobacco: Never Used  Substance and Sexual Activity   Alcohol use: Yes    Alcohol/week: 0.0 standard drinks    Comment: OCC. 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     ketoconazole (NIZORAL) 2 % cream APPLY TO AFFECTED AREA TWICE A DAY  1   Misc Natural Products (RED WINE EXTRACT) CAPS Take by mouth.     rosuvastatin (CRESTOR) 20 MG tablet Take 1 tablet (20 mg total) by mouth daily. 90 tablet 1   sildenafil (VIAGRA) 50 MG tablet Take 1 tablet (50 mg total) by mouth daily as needed for erectile dysfunction. 10 tablet 0   No facility-administered medications prior to visit.  EXAM:  BP 120/64 (BP Location: Right Arm, Patient Position: Sitting, Cuff Size: Normal)    Pulse 90    Temp 98 F (36.7 C)  (Temporal)    Ht 5\' 9"  (1.753 m)    Wt 200 lb 12.8 oz (91.1 kg)    SpO2 98%    BMI 29.65 kg/m   Body mass index is 29.65 kg/m. Wt Readings from Last 3 Encounters:  11/16/19 200 lb 12.8 oz (91.1 kg)  11/12/18 200 lb (90.7 kg)  10/19/18 195 lb 14.4 oz (88.9 kg)    Physical Exam: Vital signs reviewed [id number] is a well-developed well-nourished alert cooperative    who appearsr stated age in no acute distress.  HEENT: normocephalic atraumatic , Eyes: PERRL EOM's full, conjunctiva clear, Nares: paten,t no deformity discharge or tenderness., Ears: no deformity EAC's clear TMs with normal landmarks. Mouth: masked NECK: supple without masses, thyromegaly or bruits. CHEST/PULM:  Clear to auscultation and percussion breath sounds equal no wheeze , rales or rhonchi. No chest wall deformities or tenderness. Well healed scar   . CV: PMI is nondisplaced, S1 S2 no gallops, murmurs, rubs. Peripheral pulses are full without delay.No JVD .  ABDOMEN: Bowel sounds normal nontender  No guard or rebound, no hepato splenomegal no CVA tenderness.   Extremtities:  No clubbing cyanosis or edema, no acute joint swelling or redness no focal atrophy has VV no ujlcers  NEURO:  Oriented x3, cranial nerves 3-12 appear to be intact, no obvious focal weakness,gait within normal limits no abnormal reflexes or asymmetrical SKIN: No acute rashes normal turgor, color, no bruising or petechiae. Skin tags  Ante shins with dry patches ( says derm has assessed)  PSYCH: Oriented, good eye contact, no obvious depression anxiety, cognition and judgment appear normal. LN: no cervical axillary inguinal adenopathy  Lab Results  Component Value Date   WBC 6.0 11/16/2019   HGB 14.3 11/16/2019   HCT 42.1 11/16/2019   PLT 209.0 11/16/2019   GLUCOSE 84 11/16/2019   CHOL 173 11/16/2019   TRIG 170.0 (H) 11/16/2019   HDL 47.20 11/16/2019   LDLDIRECT 154.7 07/12/2013   LDLCALC 92 11/16/2019   ALT 18 11/16/2019   AST 17 11/16/2019    NA 138 11/16/2019   K 4.7 11/16/2019   CL 103 11/16/2019   CREATININE 1.04 11/16/2019   BUN 21 11/16/2019   CO2 27 11/16/2019   TSH 1.56 11/16/2019   PSA 0.62 11/16/2019   INR 2.0 05/23/2009   HGBA1C 5.5 10/19/2018    BP Readings from Last 3 Encounters:  11/16/19 120/64  11/12/18 126/74  10/19/18 122/80   8 am bk fast  Eggs sausage   Home fries.   Coffee   Thus l;abs not fasting  Lab plan  reviewed with patient   ASSESSMENT AND PLAN:  Discussed the following assessment and plan:    ICD-10-CM   1. Visit for preventive health examination  123456 Basic metabolic panel    CBC with Differential    Hepatic function panel    Lipid panel    TSH  2. Medication management  123456 Basic metabolic panel    CBC with Differential    Hepatic function panel    Lipid panel    TSH  3. Hyperlipidemia, unspecified hyperlipidemia type  99991111 Basic metabolic panel    CBC with Differential    Hepatic function panel    Lipid panel    TSH  4. S/P mitral valve repair  0000000 Basic metabolic panel  CBC with Differential    Hepatic function panel    Lipid panel    TSH  5. Need for immunization against influenza  Z23 Flu Vaccine QUAD 36+ mos IM  6. Screening PSA (prostate specific antigen)  Z12.5 PSA   Lab today  Avoid weight gain etc  Seems to be doing well today  Has ed but declined med  Trial he wmay talk with  Cards also  Patient Care Team: Adilene Areola, Standley Brooking, MD as PCP - General Minus Breeding, MD (Cardiology) Newman Pies, MD (Neurosurgery) Loletha Carrow Kirke Corin, MD as Consulting Physician (Gastroenterology) Patient Instructions  Will notify you  of labs when available.    Health Maintenance, Male Adopting a healthy lifestyle and getting preventive care are important in promoting health and wellness. Ask your health care provider about:  The right schedule for you to have regular tests and exams.  Things you can do on your own to prevent diseases and keep yourself  healthy. What should I know about diet, weight, and exercise? Eat a healthy diet   Eat a diet that includes plenty of vegetables, fruits, low-fat dairy products, and lean protein.  Do not eat a lot of foods that are high in solid fats, added sugars, or sodium. Maintain a healthy weight Body mass index (BMI) is a measurement that can be used to identify possible weight problems. It estimates body fat based on height and weight. Your health care provider can help determine your BMI and help you achieve or maintain a healthy weight. Get regular exercise Get regular exercise. This is one of the most important things you can do for your health. Most adults should:  Exercise for at least 150 minutes each week. The exercise should increase your heart rate and make you sweat (moderate-intensity exercise).  Do strengthening exercises at least twice a week. This is in addition to the moderate-intensity exercise.  Spend less time sitting. Even light physical activity can be beneficial. Watch cholesterol and blood lipids Have your blood tested for lipids and cholesterol at 63 years of age, then have this test every 5 years. You may need to have your cholesterol levels checked more often if:  Your lipid or cholesterol levels are high.  You are older than 63 years of age.  You are at high risk for heart disease. What should I know about cancer screening? Many types of cancers can be detected early and may often be prevented. Depending on your health history and family history, you may need to have cancer screening at various ages. This may include screening for:  Colorectal cancer.  Prostate cancer.  Skin cancer.  Lung cancer. What should I know about heart disease, diabetes, and high blood pressure? Blood pressure and heart disease  High blood pressure causes heart disease and increases the risk of stroke. This is more likely to develop in people who have high blood pressure readings, are  of African descent, or are overweight.  Talk with your health care provider about your target blood pressure readings.  Have your blood pressure checked: ? Every 3-5 years if you are 49-27 years of age. ? Every year if you are 36 years old or older.  If you are between the ages of 24 and 66 and are a current or former smoker, ask your health care provider if you should have a one-time screening for abdominal aortic aneurysm (AAA). Diabetes Have regular diabetes screenings. This checks your fasting blood sugar level. Have the screening done:  Once every three years after age 67 if you are at a normal weight and have a low risk for diabetes.  More often and at a younger age if you are overweight or have a high risk for diabetes. What should I know about preventing infection? Hepatitis B If you have a higher risk for hepatitis B, you should be screened for this virus. Talk with your health care provider to find out if you are at risk for hepatitis B infection. Hepatitis C Blood testing is recommended for:  Everyone born from [birth year] through 1965.  Anyone with known risk factors for hepatitis C. Sexually transmitted infections (STIs)  You should be screened each year for STIs, including gonorrhea and chlamydia, if: ? You are sexually active and are younger than 63 years of age. ? You are older than 63 years of age and your health care provider tells you that you are at risk for this type of infection. ? Your sexual activity has changed since you were last screened, and you are at increased risk for chlamydia or gonorrhea. Ask your health care provider if you are at risk.  Ask your health care provider about whether you are at high risk for HIV. Your health care provider may recommend a prescription medicine to help prevent HIV infection. If you choose to take medicine to prevent HIV, you should first get tested for HIV. You should then be tested every 3 months for as long as you are taking  the medicine. Follow these instructions at home: Lifestyle  Do not use any products that contain nicotine or tobacco, such as cigarettes, e-cigarettes, and chewing tobacco. If you need help quitting, ask your health care provider.  Do not use street drugs.  Do not share needles.  Ask your health care provider for help if you need support or information about quitting drugs. Alcohol use  Do not drink alcohol if your health care provider tells you not to drink.  If you drink alcohol: ? Limit how much you have to 0-2 drinks a day. ? Be aware of how much alcohol is in your drink. In the [place] one drink equals one 12 oz bottle of beer (355 mL), one 5 oz glass of wine (148 mL), or one 1 oz glass of hard liquor (44 mL). General instructions  Schedule regular health, dental, and eye exams.  Stay current with your vaccines.  Tell your health care provider if: ? You often feel depressed. ? You have ever been abused or do not feel safe at home. Summary  Adopting a healthy lifestyle and getting preventive care are important in promoting health and wellness.  Follow your health care provider's instructions about healthy diet, exercising, and getting tested or screened for diseases.  Follow your health care provider's instructions on monitoring your cholesterol and blood pressure. This information is not intended to replace advice given to you by your health care provider. Make sure you discuss any questions you have with your health care provider. Document Released: 06/05/2008 Document Revised: 12/01/2018 Document Reviewed: 12/01/2018 Elsevier Patient Education  2020 [place] Jilberto Vanderwall M.D.

## 2019-11-16 ENCOUNTER — Ambulatory Visit (INDEPENDENT_AMBULATORY_CARE_PROVIDER_SITE_OTHER): Payer: BC Managed Care – PPO | Admitting: Internal Medicine

## 2019-11-16 ENCOUNTER — Encounter: Payer: Self-pay | Admitting: Internal Medicine

## 2019-11-16 ENCOUNTER — Other Ambulatory Visit: Payer: Self-pay

## 2019-11-16 VITALS — BP 120/64 | HR 90 | Temp 98.0°F | Ht 69.0 in | Wt 200.8 lb

## 2019-11-16 DIAGNOSIS — Z0000 Encounter for general adult medical examination without abnormal findings: Secondary | ICD-10-CM

## 2019-11-16 DIAGNOSIS — Z125 Encounter for screening for malignant neoplasm of prostate: Secondary | ICD-10-CM | POA: Diagnosis not present

## 2019-11-16 DIAGNOSIS — E785 Hyperlipidemia, unspecified: Secondary | ICD-10-CM

## 2019-11-16 DIAGNOSIS — Z9889 Other specified postprocedural states: Secondary | ICD-10-CM

## 2019-11-16 DIAGNOSIS — Z79899 Other long term (current) drug therapy: Secondary | ICD-10-CM | POA: Diagnosis not present

## 2019-11-16 DIAGNOSIS — Z23 Encounter for immunization: Secondary | ICD-10-CM

## 2019-11-16 LAB — BASIC METABOLIC PANEL
BUN: 21 mg/dL (ref 6–23)
CO2: 27 mEq/L (ref 19–32)
Calcium: 9.5 mg/dL (ref 8.4–10.5)
Chloride: 103 mEq/L (ref 96–112)
Creatinine, Ser: 1.04 mg/dL (ref 0.40–1.50)
GFR: 72.14 mL/min (ref >60.00)
Glucose, Bld: 84 mg/dL (ref 70–99)
Potassium: 4.7 mEq/L (ref 3.5–5.1)
Sodium: 138 mEq/L (ref 135–145)

## 2019-11-16 LAB — CBC WITH DIFFERENTIAL/PLATELET
Basophils Absolute: 0 10*3/uL (ref 0.0–0.1)
Basophils Relative: 0.6 % (ref 0.0–3.0)
Eosinophils Absolute: 0.2 10*3/uL (ref 0.0–0.7)
Eosinophils Relative: 3.5 % (ref 0.0–5.0)
HCT: 42.1 % (ref 39.0–52.0)
Hemoglobin: 14.3 g/dL (ref 13.0–17.0)
Lymphocytes Relative: 42.6 % (ref 12.0–46.0)
Lymphs Abs: 2.6 10*3/uL (ref 0.7–4.0)
MCHC: 34 g/dL (ref 30.0–36.0)
MCV: 87.1 fl (ref 78.0–100.0)
Monocytes Absolute: 0.4 10*3/uL (ref 0.1–1.0)
Monocytes Relative: 5.9 % (ref 3.0–12.0)
Neutro Abs: 2.9 10*3/uL (ref 1.4–7.7)
Neutrophils Relative %: 47.4 % (ref 43.0–77.0)
Platelets: 209 10*3/uL (ref 150.0–400.0)
RBC: 4.84 Mil/uL (ref 4.22–5.81)
RDW: 13.1 % (ref 11.5–15.5)
WBC: 6 10*3/uL (ref 4.0–10.5)

## 2019-11-16 LAB — LIPID PANEL
Cholesterol: 173 mg/dL (ref 0–200)
HDL: 47.2 mg/dL (ref >39.00)
LDL Cholesterol: 92 mg/dL (ref 0–99)
NonHDL: 126.1
Total CHOL/HDL Ratio: 4
Triglycerides: 170 mg/dL — ABNORMAL HIGH (ref 0.0–149.0)
VLDL: 34 mg/dL (ref 0.0–40.0)

## 2019-11-16 LAB — HEPATIC FUNCTION PANEL
ALT: 18 U/L (ref 0–53)
AST: 17 U/L (ref 0–37)
Albumin: 4.1 g/dL (ref 3.5–5.2)
Alkaline Phosphatase: 64 U/L (ref 39–117)
Bilirubin, Direct: 0.1 mg/dL (ref 0.0–0.3)
Total Bilirubin: 0.7 mg/dL (ref 0.2–1.2)
Total Protein: 6.8 g/dL (ref 6.0–8.3)

## 2019-11-16 LAB — TSH: TSH: 1.56 u[IU]/mL (ref 0.35–4.50)

## 2019-11-16 LAB — PSA: PSA: 0.62 ng/mL (ref 0.10–4.00)

## 2019-11-16 NOTE — Patient Instructions (Addendum)
Will notify you  of labs when available.    Health Maintenance, Male Adopting a healthy lifestyle and getting preventive care are important in promoting health and wellness. Ask your health care provider about:  The right schedule for you to have regular tests and exams.  Things you can do on your own to prevent diseases and keep yourself healthy. What should I know about diet, weight, and exercise? Eat a healthy diet   Eat a diet that includes plenty of vegetables, fruits, low-fat dairy products, and lean protein.  Do not eat a lot of foods that are high in solid fats, added sugars, or sodium. Maintain a healthy weight Body mass index (BMI) is a measurement that can be used to identify possible weight problems. It estimates body fat based on height and weight. Your health care provider can help determine your BMI and help you achieve or maintain a healthy weight. Get regular exercise Get regular exercise. This is one of the most important things you can do for your health. Most adults should:  Exercise for at least 150 minutes each week. The exercise should increase your heart rate and make you sweat (moderate-intensity exercise).  Do strengthening exercises at least twice a week. This is in addition to the moderate-intensity exercise.  Spend less time sitting. Even light physical activity can be beneficial. Watch cholesterol and blood lipids Have your blood tested for lipids and cholesterol at 63 years of age, then have this test every 5 years. You may need to have your cholesterol levels checked more often if:  Your lipid or cholesterol levels are high.  You are older than 63 years of age.  You are at high risk for heart disease. What should I know about cancer screening? Many types of cancers can be detected early and may often be prevented. Depending on your health history and family history, you may need to have cancer screening at various ages. This may include screening  for:  Colorectal cancer.  Prostate cancer.  Skin cancer.  Lung cancer. What should I know about heart disease, diabetes, and high blood pressure? Blood pressure and heart disease  High blood pressure causes heart disease and increases the risk of stroke. This is more likely to develop in people who have high blood pressure readings, are of African descent, or are overweight.  Talk with your health care provider about your target blood pressure readings.  Have your blood pressure checked: ? Every 3-5 years if you are 4-75 years of age. ? Every year if you are 81 years old or older.  If you are between the ages of 83 and 55 and are a current or former smoker, ask your health care provider if you should have a one-time screening for abdominal aortic aneurysm (AAA). Diabetes Have regular diabetes screenings. This checks your fasting blood sugar level. Have the screening done:  Once every three years after age 27 if you are at a normal weight and have a low risk for diabetes.  More often and at a younger age if you are overweight or have a high risk for diabetes. What should I know about preventing infection? Hepatitis B If you have a higher risk for hepatitis B, you should be screened for this virus. Talk with your health care provider to find out if you are at risk for hepatitis B infection. Hepatitis C Blood testing is recommended for:  Everyone born from [birth year] through 1965.  Anyone with known risk factors for hepatitis C.  Sexually transmitted infections (STIs)  You should be screened each year for STIs, including gonorrhea and chlamydia, if: ? You are sexually active and are younger than 63 years of age. ? You are older than 63 years of age and your health care provider tells you that you are at risk for this type of infection. ? Your sexual activity has changed since you were last screened, and you are at increased risk for chlamydia or gonorrhea. Ask your health care  provider if you are at risk.  Ask your health care provider about whether you are at high risk for HIV. Your health care provider may recommend a prescription medicine to help prevent HIV infection. If you choose to take medicine to prevent HIV, you should first get tested for HIV. You should then be tested every 3 months for as long as you are taking the medicine. Follow these instructions at home: Lifestyle  Do not use any products that contain nicotine or tobacco, such as cigarettes, e-cigarettes, and chewing tobacco. If you need help quitting, ask your health care provider.  Do not use street drugs.  Do not share needles.  Ask your health care provider for help if you need support or information about quitting drugs. Alcohol use  Do not drink alcohol if your health care provider tells you not to drink.  If you drink alcohol: ? Limit how much you have to 0-2 drinks a day. ? Be aware of how much alcohol is in your drink. In the [place] one drink equals one 12 oz bottle of beer (355 mL), one 5 oz glass of wine (148 mL), or one 1 oz glass of hard liquor (44 mL). General instructions  Schedule regular health, dental, and eye exams.  Stay current with your vaccines.  Tell your health care provider if: ? You often feel depressed. ? You have ever been abused or do not feel safe at home. Summary  Adopting a healthy lifestyle and getting preventive care are important in promoting health and wellness.  Follow your health care provider's instructions about healthy diet, exercising, and getting tested or screened for diseases.  Follow your health care provider's instructions on monitoring your cholesterol and blood pressure. This information is not intended to replace advice given to you by your health care provider. Make sure you discuss any questions you have with your health care provider. Document Released: 06/05/2008 Document Revised: 12/01/2018 Document Reviewed: 12/01/2018 Elsevier  Patient Education  2020 [employer].

## 2019-11-19 NOTE — Progress Notes (Signed)
Cardiology Office Note   Date:  11/21/2019   ID:  Jeremy Wolf, DOB [birth year], MRN [id number]  PCP:  Burnis Medin, MD  Cardiologist:   Minus Breeding, MD   Chief Complaint  Patient presents with  . Mitral Valve Repair      History of Present Illness: Jeremy Wolf is a 63 y.o. male who presents for followup of mitral valve repair.  Since I last saw him he has done well.   The patient denies any new symptoms such as chest discomfort, neck or arm discomfort. There has been no new shortness of breath, PND or orthopnea. There have been no reported palpitations, presyncope or syncope.  He works a physical job.  The patient denies any new symptoms such as chest discomfort, neck or arm discomfort. There has been no new shortness of breath, PND or orthopnea. There have been no reported presyncope or syncope.   He has rare palpitations related to caffeine probably.  Past Medical History:  Diagnosis Date  . Allergic rhinitis   . Allergy   . Atrial fibrillation ([place]) 02/10/2009   Centricity Description: FIBRILLATION, ATRIAL Qualifier: Diagnosis of  By: Percival Spanish, MD, Farrel Gordon   Centricity Description: ATRIAL FIBRILLATION Qualifier: Diagnosis of  By: Sarajane Jews MD, Ishmael Holter   . Blood transfusion without reported diagnosis 2010   after heart surgery  . CAD (coronary artery disease)    a. cath 12/09: oD1 50%, EF 65%;  b. negative ETT 7/11  . Cataract    removed left eye with lens implant   . Constipation    uses Miralax  . History of atrial fibrillation    post op AFib  . Hx of colonic polyps   . Hyperlipidemia   . Mitral valve prolapse    a. with severe regurgitation; s/p MV repair 2/10;  b. 07/2011 Echo: EF 50-55%, mild MR.  . Nephrolithiasis    hx of  . Patent foramen ovale    a. s/p closure at MV repair in 2010  . Plantar fasciitis of left foot 07/06/2012  . Premature birth ?    twin  was in hosp 30 days feeding lazy eye small weight   . Rheumatic fever with heart involvement     MVP per pt due to this with repair 2010  . Sleep apnea    no cpap    Past Surgical History:  Procedure Laterality Date  . COLONOSCOPY    . HERNIA REPAIR Right 2012   Inguinal 3 years ago  . PATENT FORAMEN OVALE CLOSURE  2010  . POLYPECTOMY    . SPINE SURGERY  12/09/2010   vertebra 5 & 6  . THORACOTOMY  2010   Rt miniature for mitral valve repair, quadrangular resection of posterior leaftlet with transposition of  native chordae tendineae x 2 and 6mm Edwards Physio II ring annuloplasty     Current Outpatient Medications  Medication Sig Dispense Refill  . aspirin EC 81 MG tablet Take 81 mg by mouth daily.    . Misc Natural Products (RED WINE EXTRACT) CAPS Take by mouth.    . rosuvastatin (CRESTOR) 20 MG tablet Take 1 tablet (20 mg total) by mouth daily. 90 tablet 1  . sildenafil (VIAGRA) 50 MG tablet Take 1 tablet (50 mg total) by mouth daily as needed for erectile dysfunction. 30 tablet 3   No current facility-administered medications for this visit.     Allergies:   Patient has no known allergies.    ROS:  Please see the history of present illness.   Otherwise, review of systems are positive for none.   All other systems are reviewed and negative.    PHYSICAL EXAM: VS:  BP 128/76   Pulse 82   Ht 5' 9.5" (1.765 m)   Wt 201 lb 12.8 oz (91.5 kg)   SpO2 97%   BMI 29.37 kg/m  , BMI Body mass index is 29.37 kg/m. GENERAL:  Well appearing NECK:  No jugular venous distention, waveform within normal limits, carotid upstroke brisk and symmetric, no bruits, no thyromegaly LUNGS:  Clear to auscultation bilaterally CHEST:  Well healed surgical scar.  HEART:  PMI not displaced or sustained,S1 and S2 within normal limits, no S3, no S4, no clicks, no rubs, 2 out of 6 brief mid-to-late systolic murmur heard only at the left lateral and axillary areas, no diastolic murmurs ABD:  Flat, positive bowel sounds normal in frequency in pitch, no bruits, no rebound, no guarding, no midline  pulsatile mass, no hepatomegaly, no splenomegaly EXT:  2 plus pulses throughout, no edema, no cyanosis no clubbing   EKG:  EKG is ordered today. The ekg ordered today demonstrates normal sinus rhythm rate 82, left axis deviation, poor anterior R wave progression.  Nonspecific diffuse T wave flattening.   Recent Labs: 11/16/2019: ALT 18; BUN 21; Creatinine, Ser 1.04; Hemoglobin 14.3; Platelets 209.0; Potassium 4.7; Sodium 138; TSH 1.56    Lipid Panel    Component Value Date/Time   CHOL 173 11/16/2019 1435   TRIG 170.0 (H) 11/16/2019 1435   HDL 47.20 11/16/2019 1435   CHOLHDL 4 11/16/2019 1435   VLDL 34.0 11/16/2019 1435   LDLCALC 92 11/16/2019 1435   LDLDIRECT 154.7 07/12/2013 0800      Wt Readings from Last 3 Encounters:  11/21/19 201 lb 12.8 oz (91.5 kg)  11/16/19 200 lb 12.8 oz (91.1 kg)  11/12/18 200 lb (90.7 kg)      Other studies Reviewed: Additional studies/ records that were reviewed today include: Labs. Review of the above records demonstrates:  Please see elsewhere in the note.     ASSESSMENT AND PLAN:  Mitral Valve Repair -  He does have a slight systolic murmur.  Its been 2-1/2 years and I will repeat an echocardiogram.   Mobitz I Heart Block - He feels rare palpitations.  No significant arrhythmias noted on Holter couple of years ago.  No change in therapy.  ED - I will renew his prescription for this.  CAD - He has had no chest pain.  I will continue with risk reduction.  DYSLIPIDEMIA - At the last visit I changed him to Crestor.  His LDL that went down from the 120s to 92.  He will continue this therapy with further diet modification.    Current medicines are reviewed at length with the patient today.  The patient does not have concerns regarding medicines.  The following changes have been made:  no change  Labs/ tests ordered today include:   Orders Placed This Encounter  Procedures  . EKG 12-Lead  . ECHOCARDIOGRAM COMPLETE      Disposition:   FU with me in one year.     Signed, Minus Breeding, MD  11/21/2019 9:32 AM    [facility]

## 2019-11-21 ENCOUNTER — Encounter: Payer: Self-pay | Admitting: Cardiology

## 2019-11-21 ENCOUNTER — Ambulatory Visit: Payer: BC Managed Care – PPO | Admitting: Cardiology

## 2019-11-21 ENCOUNTER — Other Ambulatory Visit: Payer: Self-pay

## 2019-11-21 VITALS — BP 128/76 | HR 82 | Ht 69.5 in | Wt 201.8 lb

## 2019-11-21 DIAGNOSIS — Z8249 Family history of ischemic heart disease and other diseases of the circulatory system: Secondary | ICD-10-CM | POA: Diagnosis not present

## 2019-11-21 DIAGNOSIS — I441 Atrioventricular block, second degree: Secondary | ICD-10-CM | POA: Diagnosis not present

## 2019-11-21 DIAGNOSIS — Z9889 Other specified postprocedural states: Secondary | ICD-10-CM

## 2019-11-21 DIAGNOSIS — E785 Hyperlipidemia, unspecified: Secondary | ICD-10-CM

## 2019-11-21 DIAGNOSIS — I2510 Atherosclerotic heart disease of native coronary artery without angina pectoris: Secondary | ICD-10-CM

## 2019-11-21 MED ORDER — SILDENAFIL CITRATE 50 MG PO TABS: 50.0000 mg | ORAL_TABLET | Freq: Every day | ORAL | 3 refills | Status: DC | PRN

## 2019-11-21 NOTE — Patient Instructions (Signed)
Medication Instructions:  Your physician recommends that you continue on your current medications as directed. Please refer to the Current Medication list given to you today.  If you need a refill on your cardiac medications before your next appointment, please call your pharmacy.   Lab work: NONE  Testing/Procedures: Your physician has requested that you have an echocardiogram. Echocardiography is a painless test that uses sound waves to create images of your heart. It provides your doctor with information about the size and shape of your heart and how well your heart's chambers and valves are working. This procedure takes approximately one hour. There are no restrictions for this procedure. [address]  Follow-Up: At [facility], you and your health needs are our priority.  As part of our continuing mission to provide you with exceptional heart care, we have created designated Provider Care Teams.  These Care Teams include your primary Cardiologist (physician) and Advanced Practice Providers (APPs -  Physician Assistants and Nurse Practitioners) who all work together to provide you with the care you need, when you need it. You may see Minus Breeding, MD or one of the following Advanced Practice Providers on your designated Care Team:    Rosaria Ferries, PA-C  Jory Sims, DNP, ANP  Cadence Kathlen Mody, NP  Your physician wants you to follow-up in: 1 year. You will receive a reminder letter in the mail two months in advance. If you don't receive a letter, please call our office to schedule the follow-up appointment.

## 2019-11-24 ENCOUNTER — Encounter (HOSPITAL_COMMUNITY): Payer: Self-pay | Admitting: Cardiology

## 2019-12-12 ENCOUNTER — Ambulatory Visit (HOSPITAL_COMMUNITY): Payer: BC Managed Care – PPO | Attending: Cardiovascular Disease

## 2019-12-12 ENCOUNTER — Other Ambulatory Visit: Payer: Self-pay

## 2019-12-12 DIAGNOSIS — Z8249 Family history of ischemic heart disease and other diseases of the circulatory system: Secondary | ICD-10-CM

## 2019-12-12 DIAGNOSIS — Z9889 Other specified postprocedural states: Secondary | ICD-10-CM | POA: Diagnosis not present

## 2019-12-13 ENCOUNTER — Other Ambulatory Visit: Payer: Self-pay

## 2019-12-13 DIAGNOSIS — I340 Nonrheumatic mitral (valve) insufficiency: Secondary | ICD-10-CM

## 2020-03-19 ENCOUNTER — Other Ambulatory Visit: Payer: Self-pay

## 2020-03-19 DIAGNOSIS — Z9889 Other specified postprocedural states: Secondary | ICD-10-CM

## 2020-03-19 MED ORDER — ROSUVASTATIN CALCIUM 20 MG PO TABS: 20.0000 mg | ORAL_TABLET | Freq: Every day | ORAL | 3 refills | Status: DC

## 2020-05-01 DIAGNOSIS — L300 Nummular dermatitis: Secondary | ICD-10-CM | POA: Diagnosis not present

## 2020-09-05 ENCOUNTER — Other Ambulatory Visit: Payer: Self-pay | Admitting: Cardiology

## 2020-09-05 DIAGNOSIS — Z9889 Other specified postprocedural states: Secondary | ICD-10-CM

## 2020-10-25 NOTE — Progress Notes (Signed)
Cardiology Office Note   Date:  10/26/2020   ID:  Jeremy Wolf, DOB June 05, 1956, MRN [id number]  PCP:  Burnis Medin, MD  Cardiologist:   Minus Breeding, MD   Chief Complaint  Patient presents with  . Leg Swelling     History of Present Illness: Jeremy Wolf is a 64 y.o. male who presents for followup of mitral valve repair.  Since I last saw him he has done well.  He had an echo last year with NL LV function but moderate to mild MR.  He does have some mild lower extremity edema.  The patient denies any new symptoms such as chest discomfort, neck or arm discomfort. There has been no new shortness of breath, PND or orthopnea. There have been no reported palpitations, presyncope or syncope.  He lost his job and now has less physical job and actually likes it more.      Past Medical History:  Diagnosis Date  . Allergic rhinitis   . Allergy   . Atrial fibrillation ([place]) 02/10/2009   Centricity Description: FIBRILLATION, ATRIAL Qualifier: Diagnosis of  By: Percival Spanish, MD, Farrel Gordon   Centricity Description: ATRIAL FIBRILLATION Qualifier: Diagnosis of  By: Sarajane Jews MD, Ishmael Holter   . Blood transfusion without reported diagnosis 2010   after heart surgery  . CAD (coronary artery disease)    a. cath 12/09: oD1 50%, EF 65%;  b. negative ETT 7/11  . Cataract    removed left eye with lens implant   . Constipation    uses Miralax  . History of atrial fibrillation    post op AFib  . Hx of colonic polyps   . Hyperlipidemia   . Mitral valve prolapse    a. with severe regurgitation; s/p MV repair 2/10;  b. 07/2011 Echo: EF 50-55%, mild MR.  . Nephrolithiasis    hx of  . Patent foramen ovale    a. s/p closure at MV repair in 2010  . Plantar fasciitis of left foot 07/06/2012  . Premature birth ?    twin  was in hosp 30 days feeding lazy eye small weight   . Rheumatic fever with heart involvement    MVP per pt due to this with repair 2010  . Sleep apnea    no cpap    Past Surgical  History:  Procedure Laterality Date  . COLONOSCOPY    . HERNIA REPAIR Right 2012   Inguinal 3 years ago  . PATENT FORAMEN OVALE CLOSURE  2010  . POLYPECTOMY    . SPINE SURGERY  12/09/2010   vertebra 5 & 6  . THORACOTOMY  2010   Rt miniature for mitral valve repair, quadrangular resection of posterior leaftlet with transposition of  native chordae tendineae x 2 and 23mm Edwards Physio II ring annuloplasty     Current Outpatient Medications  Medication Sig Dispense Refill  . aspirin EC 81 MG tablet Take 81 mg by mouth daily.    . Misc Natural Products (RED WINE EXTRACT) CAPS Take by mouth.    . rosuvastatin (CRESTOR) 20 MG tablet TAKE 1 TABLET BY MOUTH DAILY. GENERIC EQUIVALENT FOR CRESTOR. 90 tablet 0  . sildenafil (VIAGRA) 50 MG tablet Take 1 tablet (50 mg total) by mouth daily as needed for erectile dysfunction. 30 tablet 3   No current facility-administered medications for this visit.    Allergies:   Patient has no known allergies.    ROS:  Please see the history of present illness.  Otherwise, review of systems are positive for none.   All other systems are reviewed and negative.    PHYSICAL EXAM: VS:  BP 122/84   Pulse 71   Ht 5\' 9"  (1.753 m)   Wt 201 lb (91.2 kg)   SpO2 100%   BMI 29.68 kg/m  , BMI Body mass index is 29.68 kg/m. GENERAL:  Well appearing NECK:  No jugular venous distention, waveform within normal limits, carotid upstroke brisk and symmetric, no bruits, no thyromegaly LUNGS:  Clear to auscultation bilaterally CHEST:  Surgical scars well healed HEART:  PMI not displaced or sustained,S1 and S2 within normal limits, no S3, no S4, no clicks, no rubs, no murmurs ABD:  Flat, positive bowel sounds normal in frequency in pitch, no bruits, no rebound, no guarding, no midline pulsatile mass, no hepatomegaly, no splenomegaly EXT:  2 plus pulses throughout, no edema, no cyanosis no clubbing  EKG:  EKG is  ordered today. The ekg ordered today demonstrates  normal sinus rhythm rate 71, left axis deviation, poor anterior R wave progression.  Nonspecific diffuse T wave flattening.   Recent Labs: 11/16/2019: ALT 18; BUN 21; Creatinine, Ser 1.04; Hemoglobin 14.3; Platelets 209.0; Potassium 4.7; Sodium 138; TSH 1.56    Lipid Panel    Component Value Date/Time   CHOL 173 11/16/2019 1435   TRIG 170.0 (H) 11/16/2019 1435   HDL 47.20 11/16/2019 1435   CHOLHDL 4 11/16/2019 1435   VLDL 34.0 11/16/2019 1435   LDLCALC 92 11/16/2019 1435   LDLDIRECT 154.7 07/12/2013 0800      Wt Readings from Last 3 Encounters:  10/26/20 201 lb (91.2 kg)  11/21/19 201 lb 12.8 oz (91.5 kg)  11/16/19 200 lb 12.8 oz (91.1 kg)      Other studies Reviewed: Additional studies/ records that were reviewed today include: None Review of the above records demonstrates:  NA     ASSESSMENT AND PLAN:  Mitral Valve Repair -  I do not appreciate the heart murmur this year but because he had mitral regurgitation last year which was not there 2 years before I would like to follow him with another echocardiogram.  He understands endocarditis prophylaxis.   Mobitz I Heart Block - He has had some rare palpitations.  These are unchanged.  No change in therapy.  ED - I will renew Viagra.  CAD - He has no chest pain.   DYSLIPIDEMIA - He is due to have this checked next month.  A couple of years ago I changed him to Crestor.  His LDL went down from 01/12/1991.  Hopefully he is at the 70 mark.  If not he might need a higher dose of statin.   LEG SWELLING - This is likely just some mild venous insufficiency and should be treated conservatively and we talked about compression stockings.   Current medicines are reviewed at length with the patient today.  The patient does not have concerns regarding medicines.  The following changes have been made:  None  Labs/ tests ordered today include:   Orders Placed This Encounter  Procedures  . EKG 12-Lead     Disposition:    FU with me in one year.     Signed, Minus Breeding, MD  10/26/2020 8:27 AM    [place]

## 2020-10-26 ENCOUNTER — Other Ambulatory Visit: Payer: Self-pay

## 2020-10-26 ENCOUNTER — Ambulatory Visit: Payer: BC Managed Care – PPO | Admitting: Cardiology

## 2020-10-26 ENCOUNTER — Encounter: Payer: Self-pay | Admitting: Cardiology

## 2020-10-26 ENCOUNTER — Telehealth: Payer: Self-pay

## 2020-10-26 VITALS — BP 122/84 | HR 71 | Ht 69.0 in | Wt 201.0 lb

## 2020-10-26 DIAGNOSIS — I340 Nonrheumatic mitral (valve) insufficiency: Secondary | ICD-10-CM | POA: Diagnosis not present

## 2020-10-26 DIAGNOSIS — Z9889 Other specified postprocedural states: Secondary | ICD-10-CM

## 2020-10-26 DIAGNOSIS — N529 Male erectile dysfunction, unspecified: Secondary | ICD-10-CM | POA: Diagnosis not present

## 2020-10-26 DIAGNOSIS — E785 Hyperlipidemia, unspecified: Secondary | ICD-10-CM | POA: Diagnosis not present

## 2020-10-26 DIAGNOSIS — I2510 Atherosclerotic heart disease of native coronary artery without angina pectoris: Secondary | ICD-10-CM | POA: Diagnosis not present

## 2020-10-26 MED ORDER — SILDENAFIL CITRATE 50 MG PO TABS: 50.0000 mg | ORAL_TABLET | Freq: Every day | ORAL | 3 refills | Status: DC | PRN

## 2020-10-26 NOTE — Telephone Encounter (Signed)
Left message to confirm whether pt is coming to office appt with Dr. Percival Spanish this a.m. Asked pt to call back.

## 2020-10-26 NOTE — Patient Instructions (Signed)
Medication Instructions:  No changes *If you need a refill on your cardiac medications before your next appointment, please call your pharmacy*   Lab Work: None ordered If you have labs (blood work) drawn today and your tests are completely normal, you will receive your results only by:  [place] (if you have MyChart) OR  A paper copy in the mail If you have any lab test that is abnormal or we need to change your treatment, we will call you to review the results.   Testing/Procedures: Keep your appointment for echocardiogram 12/17/20 at 10:35 a.m.   Follow-Up: At [facility], you and your health needs are our priority.  As part of our continuing mission to provide you with exceptional heart care, we have created designated Provider Care Teams.  These Care Teams include your primary Cardiologist (physician) and Advanced Practice Providers (APPs -  Physician Assistants and Nurse Practitioners) who all work together to provide you with the care you need, when you need it.  We recommend signing up for the patient portal called "MyChart".  Sign up information is provided on this After Visit Summary.  MyChart is used to connect with patients for Virtual Visits (Telemedicine).  Patients are able to view lab/test results, encounter notes, upcoming appointments, etc.  Non-urgent messages can be sent to your provider as well.   To learn more about what you can do with MyChart, go to [email or web address].    Your next appointment:   12 month(s)  The format for your next appointment:   In Person  Provider:   Minus Breeding, MD   Other Instructions None

## 2020-12-17 ENCOUNTER — Ambulatory Visit (INDEPENDENT_AMBULATORY_CARE_PROVIDER_SITE_OTHER): Payer: BC Managed Care – PPO | Admitting: Internal Medicine

## 2020-12-17 ENCOUNTER — Encounter: Payer: Self-pay | Admitting: Internal Medicine

## 2020-12-17 ENCOUNTER — Other Ambulatory Visit: Payer: Self-pay

## 2020-12-17 ENCOUNTER — Ambulatory Visit (HOSPITAL_COMMUNITY): Payer: BC Managed Care – PPO | Attending: Cardiology

## 2020-12-17 VITALS — BP 126/84 | HR 97 | Temp 98.6°F | Ht 69.0 in | Wt 202.6 lb

## 2020-12-17 DIAGNOSIS — R21 Rash and other nonspecific skin eruption: Secondary | ICD-10-CM

## 2020-12-17 DIAGNOSIS — Z23 Encounter for immunization: Secondary | ICD-10-CM | POA: Diagnosis not present

## 2020-12-17 DIAGNOSIS — I340 Nonrheumatic mitral (valve) insufficiency: Secondary | ICD-10-CM | POA: Insufficient documentation

## 2020-12-17 DIAGNOSIS — Z7189 Other specified counseling: Secondary | ICD-10-CM

## 2020-12-17 DIAGNOSIS — E785 Hyperlipidemia, unspecified: Secondary | ICD-10-CM | POA: Diagnosis not present

## 2020-12-17 DIAGNOSIS — Z125 Encounter for screening for malignant neoplasm of prostate: Secondary | ICD-10-CM

## 2020-12-17 DIAGNOSIS — Z79899 Other long term (current) drug therapy: Secondary | ICD-10-CM | POA: Diagnosis not present

## 2020-12-17 DIAGNOSIS — Z9889 Other specified postprocedural states: Secondary | ICD-10-CM | POA: Diagnosis not present

## 2020-12-17 DIAGNOSIS — Z0000 Encounter for general adult medical examination without abnormal findings: Secondary | ICD-10-CM | POA: Diagnosis not present

## 2020-12-17 LAB — CBC WITH DIFFERENTIAL/PLATELET
Basophils Absolute: 0 10*3/uL (ref 0.0–0.1)
Basophils Relative: 0.7 % (ref 0.0–3.0)
Eosinophils Absolute: 0.2 10*3/uL (ref 0.0–0.7)
Eosinophils Relative: 3.9 % (ref 0.0–5.0)
HCT: 42.4 % (ref 39.0–52.0)
Hemoglobin: 14.4 g/dL (ref 13.0–17.0)
Lymphocytes Relative: 43.9 % (ref 12.0–46.0)
Lymphs Abs: 2.6 10*3/uL (ref 0.7–4.0)
MCHC: 34 g/dL (ref 30.0–36.0)
MCV: 87.2 fl (ref 78.0–100.0)
Monocytes Absolute: 0.4 10*3/uL (ref 0.1–1.0)
Monocytes Relative: 7 % (ref 3.0–12.0)
Neutro Abs: 2.6 10*3/uL (ref 1.4–7.7)
Neutrophils Relative %: 44.5 % (ref 43.0–77.0)
Platelets: 188 10*3/uL (ref 150.0–400.0)
RBC: 4.86 Mil/uL (ref 4.22–5.81)
RDW: 14.1 % (ref 11.5–15.5)
WBC: 5.8 10*3/uL (ref 4.0–10.5)

## 2020-12-17 LAB — BASIC METABOLIC PANEL WITH GFR
BUN: 13 mg/dL (ref 7–25)
CO2: 26 mmol/L (ref 20–32)
Calcium: 9.5 mg/dL (ref 8.6–10.3)
Chloride: 102 mmol/L (ref 98–110)
Creat: 0.91 mg/dL (ref 0.70–1.25)
GFR, Est African American: 103 mL/min/{1.73_m2} (ref >=60)
GFR, Est Non African American: 89 mL/min/{1.73_m2} (ref >=60)
Glucose, Bld: 104 mg/dL — ABNORMAL HIGH (ref 65–99)
Potassium: 4 mmol/L (ref 3.5–5.3)
Sodium: 137 mmol/L (ref 135–146)

## 2020-12-17 LAB — HEPATIC FUNCTION PANEL
ALT: 22 U/L (ref 0–53)
AST: 17 U/L (ref 0–37)
Albumin: 4.4 g/dL (ref 3.5–5.2)
Alkaline Phosphatase: 53 U/L (ref 39–117)
Bilirubin, Direct: 0.1 mg/dL (ref 0.0–0.3)
Total Bilirubin: 0.7 mg/dL (ref 0.2–1.2)
Total Protein: 7 g/dL (ref 6.0–8.3)

## 2020-12-17 LAB — LIPID PANEL
Cholesterol: 164 mg/dL (ref 0–200)
HDL: 49.4 mg/dL (ref >39.00)
LDL Cholesterol: 84 mg/dL (ref 0–99)
NonHDL: 115.01
Total CHOL/HDL Ratio: 3
Triglycerides: 156 mg/dL — ABNORMAL HIGH (ref 0.0–149.0)
VLDL: 31.2 mg/dL (ref 0.0–40.0)

## 2020-12-17 LAB — TSH: TSH: 3.08 u[IU]/mL (ref 0.35–4.50)

## 2020-12-17 LAB — ECHOCARDIOGRAM COMPLETE
Area-P 1/2: 2.45 cm2
Height: 69 in
MV M vel: 4.94 m/s
MV Peak grad: 97.6 mmHg
S' Lateral: 2.8 cm
Weight: 3241.6 oz

## 2020-12-17 LAB — PSA: PSA: 0.61 ng/mL (ref 0.10–4.00)

## 2020-12-17 NOTE — Patient Instructions (Addendum)
Will notify you  of labs when available. Advise   Get the covid vaccine the pfizer  As we discussed .   Advise  Fu with dermatology if rash is prgressing  For advice   Health Maintenance, Male Adopting a healthy lifestyle and getting preventive care are important in promoting health and wellness. Ask your health care provider about:  The right schedule for you to have regular tests and exams.  Things you can do on your own to prevent diseases and keep yourself healthy. What should I know about diet, weight, and exercise? Eat a healthy diet   Eat a diet that includes plenty of vegetables, fruits, low-fat dairy products, and lean protein.  Do not eat a lot of foods that are high in solid fats, added sugars, or sodium. Maintain a healthy weight Body mass index (BMI) is a measurement that can be used to identify possible weight problems. It estimates body fat based on height and weight. Your health care provider can help determine your BMI and help you achieve or maintain a healthy weight. Get regular exercise Get regular exercise. This is one of the most important things you can do for your health. Most adults should:  Exercise for at least 150 minutes each week. The exercise should increase your heart rate and make you sweat (moderate-intensity exercise).  Do strengthening exercises at least twice a week. This is in addition to the moderate-intensity exercise.  Spend less time sitting. Even light physical activity can be beneficial. Watch cholesterol and blood lipids Have your blood tested for lipids and cholesterol at 64 years of age, then have this test every 5 years. You may need to have your cholesterol levels checked more often if:  Your lipid or cholesterol levels are high.  You are older than 64 years of age.  You are at high risk for heart disease. What should I know about cancer screening? Many types of cancers can be detected early and may often be prevented. Depending  on your health history and family history, you may need to have cancer screening at various ages. This may include screening for:  Colorectal cancer.  Prostate cancer.  Skin cancer.  Lung cancer. What should I know about heart disease, diabetes, and high blood pressure? Blood pressure and heart disease  High blood pressure causes heart disease and increases the risk of stroke. This is more likely to develop in people who have high blood pressure readings, are of African descent, or are overweight.  Talk with your health care provider about your target blood pressure readings.  Have your blood pressure checked: ? Every 3-5 years if you are 19-48 years of age. ? Every year if you are 50 years old or older.  If you are between the ages of 98 and 71 and are a current or former smoker, ask your health care provider if you should have a one-time screening for abdominal aortic aneurysm (AAA). Diabetes Have regular diabetes screenings. This checks your fasting blood sugar level. Have the screening done:  Once every three years after age 73 if you are at a normal weight and have a low risk for diabetes.  More often and at a younger age if you are overweight or have a high risk for diabetes. What should I know about preventing infection? Hepatitis B If you have a higher risk for hepatitis B, you should be screened for this virus. Talk with your health care provider to find out if you are at risk  for hepatitis B infection. Hepatitis C Blood testing is recommended for:  Everyone born from [birth year] through 1965.  Anyone with known risk factors for hepatitis C. Sexually transmitted infections (STIs)  You should be screened each year for STIs, including gonorrhea and chlamydia, if: ? You are sexually active and are younger than 64 years of age. ? You are older than 64 years of age and your health care provider tells you that you are at risk for this type of infection. ? Your sexual activity has  changed since you were last screened, and you are at increased risk for chlamydia or gonorrhea. Ask your health care provider if you are at risk.  Ask your health care provider about whether you are at high risk for HIV. Your health care provider may recommend a prescription medicine to help prevent HIV infection. If you choose to take medicine to prevent HIV, you should first get tested for HIV. You should then be tested every 3 months for as long as you are taking the medicine. Follow these instructions at home: Lifestyle  Do not use any products that contain nicotine or tobacco, such as cigarettes, e-cigarettes, and chewing tobacco. If you need help quitting, ask your health care provider.  Do not use street drugs.  Do not share needles.  Ask your health care provider for help if you need support or information about quitting drugs. Alcohol use  Do not drink alcohol if your health care provider tells you not to drink.  If you drink alcohol: ? Limit how much you have to 0-2 drinks a day. ? Be aware of how much alcohol is in your drink. In the [place] one drink equals one 12 oz bottle of beer (355 mL), one 5 oz glass of wine (148 mL), or one 1 oz glass of hard liquor (44 mL). General instructions  Schedule regular health, dental, and eye exams.  Stay current with your vaccines.  Tell your health care provider if: ? You often feel depressed. ? You have ever been abused or do not feel safe at home. Summary  Adopting a healthy lifestyle and getting preventive care are important in promoting health and wellness.  Follow your health care provider's instructions about healthy diet, exercising, and getting tested or screened for diseases.  Follow your health care provider's instructions on monitoring your cholesterol and blood pressure. This information is not intended to replace advice given to you by your health care provider. Make sure you discuss any questions you have with your  health care provider. Document Revised: 12/01/2018 Document Reviewed: 12/01/2018 Elsevier Patient Education  2020 [employer].

## 2020-12-17 NOTE — Progress Notes (Signed)
Chief Complaint  Patient presents with  . Annual Exam    Physical , rash on both x6 months , redness, icthing , no pain     HPI: Patient  Jeremy Wolf  64 y.o. comes in today for Preventive Health Care visit   No major change iun healt To get fu echo today. Rash on pretibial area  Some worse  Saw dr Ronnald Ramp and cream given helped some for a short while . No pain or itching  Father passed with blood disorder and had a rash  Gi utd  For colon polyps  Constipation better   on routine fu  Has  Not gotten covid vaccine  Concerns   Ok for lfu vaccine today   Health Maintenance  Topic Date Due  . COVID-19 Vaccine (1) Never done  . HIV Screening  Never done  . TETANUS/TDAP  07/20/2023  . COLONOSCOPY (Pts 45-36yrs Insurance coverage will need to be confirmed)  10/16/2023  . INFLUENZA VACCINE  Completed  . Hepatitis C Screening  Completed   Health Maintenance Review LIFESTYLE:  Exercise:   Working  Ft   Tobacco/ETS: Alcohol:  Sugar beverages:  [name] sweet   Tea.  Sleep:   8 hours  Drug use: no HH of   2   1 cat  Work: 40 +      ROS:  GEN/ HEENT: No fever, significant weight changes sweats headaches vision problems hearing changes, CV/ PULM; No chest pain shortness of breath cough, syncope,edema  change in exercise tolerance. GI /GU: No adominal pain, vomiting, change in bowel habits. No blood in the stool. No significant GU symptoms. SKIN/HEME: ,no acute skin rashes suspicious lesions or bleeding. No lymphadenopathy, nodules, masses.  NEURO/ PSYCH:  No neurologic signs such as weakness numbness. No depression anxiety. IMM/ Allergy: No unusual infections.  Allergy .   REST of 12 system review negative except as per HPI   Past Medical History:  Diagnosis Date  . Allergic rhinitis   . Allergy   . Atrial fibrillation ([place]) 02/10/2009   Centricity Description: FIBRILLATION, ATRIAL Qualifier: Diagnosis of  By: Percival Spanish, MD, Farrel Gordon   Centricity Description: ATRIAL  FIBRILLATION Qualifier: Diagnosis of  By: Sarajane Jews MD, Ishmael Holter   . Blood transfusion without reported diagnosis 2010   after heart surgery  . CAD (coronary artery disease)    a. cath 12/09: oD1 50%, EF 65%;  b. negative ETT 7/11  . Cataract    removed left eye with lens implant   . Constipation    uses Miralax  . History of atrial fibrillation    post op AFib  . Hx of colonic polyps   . Hyperlipidemia   . Mitral valve prolapse    a. with severe regurgitation; s/p MV repair 2/10;  b. 07/2011 Echo: EF 50-55%, mild MR.  . Nephrolithiasis    hx of  . Patent foramen ovale    a. s/p closure at MV repair in 2010  . Plantar fasciitis of left foot 07/06/2012  . Premature birth ?    twin  was in hosp 30 days feeding lazy eye small weight   . Rheumatic fever with heart involvement    MVP per pt due to this with repair 2010  . Sleep apnea    no cpap    Past Surgical History:  Procedure Laterality Date  . COLONOSCOPY    . HERNIA REPAIR Right 2012   Inguinal 3 years ago  . PATENT FORAMEN OVALE CLOSURE  2010  . POLYPECTOMY    . SPINE SURGERY  12/09/2010   vertebra 5 & 6  . THORACOTOMY  2010   Rt miniature for mitral valve repair, quadrangular resection of posterior leaftlet with transposition of  native chordae tendineae x 2 and 39mm Edwards Physio II ring annuloplasty    Family History  Problem Relation Age of Onset  . Diabetes Mother   . Hyperlipidemia Mother   . Hyperlipidemia Father   . Heart disease Father        heart surgery 84 ? cabd and valve  . Cancer Daughter 39       ovarian  . Lung cancer Maternal Grandfather        lung  . Emphysema Maternal Grandfather   . Arthritis Paternal Grandmother   . Arthritis Paternal Grandfather   . Rheumatic fever Unknown        2 sisters  . Other Sister        twin hx of substance use  . Colon cancer Neg Hx   . Colon polyps Neg Hx   . Esophageal cancer Neg Hx   . Rectal cancer Neg Hx   . Stomach cancer Neg Hx     Social History    Socioeconomic History  . Marital status: Married    Spouse name: Not on file  . Number of children: 5  . Years of education: Not on file  . Highest education level: Not on file  Occupational History  . Occupation: Truck Geophysicist/field seismologist  Tobacco Use  . Smoking status: Never Smoker  . Smokeless tobacco: Never Used  Vaping Use  . Vaping Use: Never used  Substance and Sexual Activity  . Alcohol use: Yes    Alcohol/week: 0.0 standard drinks    Comment: OCC. WINE  . Drug use: No  . Sexual activity: Not on file  Other Topics Concern  . 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 aspirin EC 81 MG tablet Take 81 mg by mouth daily.    . Misc Natural Products (RED WINE EXTRACT) CAPS Take by mouth.    . rosuvastatin (CRESTOR) 20 MG tablet TAKE 1 TABLET BY MOUTH DAILY. GENERIC EQUIVALENT FOR CRESTOR. 90 tablet 0  . sildenafil (VIAGRA) 50 MG tablet Take 1 tablet (50 mg total) by mouth daily as needed for erectile dysfunction. 30 tablet 3   No facility-administered medications prior to visit.     EXAM:  BP 126/84 (BP Location: Left Arm, Patient Position: Sitting, Cuff Size: Normal)   Pulse 97   Temp 98.6 F (37 C)   Ht 5\' 9"  (1.753 m)   Wt 202 lb 9.6 oz (91.9  kg)   SpO2 (!) 77%   BMI 29.92 kg/m   Body mass index is 29.92  kg/m. Wt Readings from Last 3 Encounters:  12/17/20 202 lb 9.6 oz (91.9 kg)  10/26/20 201 lb (91.2 kg)  11/21/19 201 lb 12.8 oz (91.5 kg)    Physical Exam: Vital signs reviewed NFA:OZHYGEN:This is a well-developed well-nourished alert cooperative    who appearsr stated age in no acute distress.  HEENT: normocephalic atraumatic , Eyes: PERRL EOM's full, conjunctiva clear, Nares: paten,t no deformity discharge or tenderness., Ears: no deformity EAC's clear TMs with normal landmarks. Mouth  masked NECK: supple without masses, thyromegaly or bruits. CHEST/PULM:  Clear to auscultation and percussion breath sounds equal no wheeze , rales or rhonchi. No chest wall deformities or tenderness. Well healed  Scar right chest  CV: PMI is nondisplaced, S1 S2 no gallops, murmurs, rubs. Peripheral pulses are full without delay.No JVD .  ABDOMEN: Bowel sounds normal nontender  No guard or rebound, no hepato splenomegal no CVA tenderness. Extremtities:  No clubbing cyanosis or edema, no acute joint swelling or redness no focal atrophy Some vv  Has scok line  Mild edema  NEURO:  Oriented x3, cranial nerves 3-12 appear to be intact, no obvious focal weakness,gait within normal limits no abnormal reflexes or asymmetrical SKIN: ant tibial rash  Scaling roundish but indistinct   To nails thickened , no bruising or petechiae. PSYCH: Oriented, good eye contact, no obvious depression anxiety, cognition and judgment appear normal. LN: no cervical axillary inguinal adenopathy  Lab Results  Component Value Date   WBC 6.0 11/16/2019   HGB 14.3 11/16/2019   HCT 42.1 11/16/2019   PLT 209.0 11/16/2019   GLUCOSE 84 11/16/2019   CHOL 173 11/16/2019   TRIG 170.0 (H) 11/16/2019   HDL 47.20 11/16/2019   LDLDIRECT 154.7 07/12/2013   LDLCALC 92 11/16/2019   ALT 18 11/16/2019   AST 17 11/16/2019   NA 138 11/16/2019   K 4.7 11/16/2019   CL 103 11/16/2019   CREATININE 1.04 11/16/2019   BUN 21 11/16/2019   CO2 27 11/16/2019    TSH 1.56 11/16/2019   PSA 0.62 11/16/2019   INR 2.0 05/23/2009   HGBA1C 5.5 10/19/2018    BP Readings from Last 3 Encounters:  12/17/20 126/84  10/26/20 122/84  11/21/19 128/76    Fasting x black coffee today   ASSESSMENT AND PLAN:  Discussed the following assessment and plan:    ICD-10-CM   1. Visit for preventive health examination  Z00.00 BASIC METABOLIC PANEL WITH GFR    CBC with Differential/Platelet    Lipid panel    Hepatic function panel    TSH    TSH    Hepatic function panel    Lipid panel    CBC with Differential/Platelet    BASIC METABOLIC PANEL WITH GFR    CANCELED: CBC with Differential/Platelet    CANCELED: Lipid panel    CANCELED: Hepatic function panel    CANCELED: TSH  2. Medication management  Z79.899 BASIC METABOLIC PANEL WITH GFR    CBC with Differential/Platelet    Lipid panel    Hepatic function panel    TSH    TSH    Hepatic function panel    Lipid panel    CBC with Differential/Platelet    BASIC METABOLIC PANEL WITH GFR    CANCELED: CBC with Differential/Platelet    CANCELED: Lipid panel    CANCELED: Hepatic function panel    CANCELED: TSH  3. Rash  R21   4. S/P mitral valve repair  0000000 BASIC METABOLIC PANEL WITH GFR    CBC with Differential/Platelet    Lipid panel    Hepatic function panel    TSH    TSH    Hepatic function panel    Lipid panel    CBC with Differential/Platelet    BASIC METABOLIC PANEL WITH GFR    CANCELED: CBC with Differential/Platelet    CANCELED: Lipid panel    CANCELED: Hepatic function panel    CANCELED: TSH  5. Hyperlipidemia, unspecified hyperlipidemia type  [id number] BASIC METABOLIC PANEL WITH GFR    CBC with Differential/Platelet    Lipid panel    Hepatic function panel    TSH    TSH    Hepatic function panel    Lipid panel    CBC with Differential/Platelet    BASIC METABOLIC PANEL WITH GFR    CANCELED: CBC with Differential/Platelet    CANCELED: Lipid panel    CANCELED: Hepatic function  panel    CANCELED: TSH  6. Screening PSA (prostate specific antigen)  Z12.5 PSA    PSA    CANCELED: PSA  7. Need for influenza vaccination  Z23 Flu Vaccine QUAD 6+ mos PF IM (Fluarix Quad PF)  8. Counseled about COVID-19 virus infection  Z71.89   advised  covid vaccine  benefit much more than risk . Lab today . See derma agin  Uncertain cause   ? A psoriatic plaque  like rash but in atypical location.   Return in about 1 year (around 12/17/2021) for preventive /cpx and medications, depending on results.  Patient Care Team: Malayasia Mirkin, Standley Brooking, MD as PCP - General Minus Breeding, MD as PCP - Cardiology (Cardiology) Minus Breeding, MD (Cardiology) Newman Pies, MD (Neurosurgery) Loletha Carrow Kirke Corin, MD as Consulting Physician (Gastroenterology) Danella Sensing, MD as Consulting Physician (Dermatology) Patient Instructions  Will notify you  of labs when available. Advise   Get the covid vaccine the pfizer  As we discussed .   Advise  Fu with dermatology if rash is prgressing  For advice   Health Maintenance, Male Adopting a healthy lifestyle and getting preventive care are important in promoting health and wellness. Ask your health care provider about:  The right schedule for you to have regular tests and exams.  Things you can do on your own to prevent diseases and keep yourself healthy. What should I know about diet, weight, and exercise? Eat a healthy diet   Eat a diet that includes plenty of vegetables, fruits, low-fat dairy products, and lean protein.  Do not eat a lot of foods that are high in solid fats, added sugars, or sodium. Maintain a healthy weight Body mass index (BMI) is a measurement that can be used to identify possible weight problems. It estimates body fat based on height and weight. Your health care provider can help determine your BMI and help you achieve or maintain a healthy weight. Get regular exercise Get regular exercise. This is one of the most  important things you can do for your health. Most adults should:  Exercise for at least 150 minutes each week. The exercise should increase your heart rate and make you sweat (moderate-intensity exercise).  Do strengthening exercises at least twice a week. This is in addition to the moderate-intensity exercise.  Spend less time sitting. Even light physical activity can be beneficial. Watch cholesterol and blood lipids Have your blood tested for lipids and cholesterol at  64 years of age, then have this test every 5 years. You may need to have your cholesterol levels checked more often if:  Your lipid or cholesterol levels are high.  You are older than 64 years of age.  You are at high risk for heart disease. What should I know about cancer screening? Many types of cancers can be detected early and may often be prevented. Depending on your health history and family history, you may need to have cancer screening at various ages. This may include screening for:  Colorectal cancer.  Prostate cancer.  Skin cancer.  Lung cancer. What should I know about heart disease, diabetes, and high blood pressure? Blood pressure and heart disease  High blood pressure causes heart disease and increases the risk of stroke. This is more likely to develop in people who have high blood pressure readings, are of African descent, or are overweight.  Talk with your health care provider about your target blood pressure readings.  Have your blood pressure checked: ? Every 3-5 years if you are 104-81 years of age. ? Every year if you are 61 years old or older.  If you are between the ages of 33 and 32 and are a current or former smoker, ask your health care provider if you should have a one-time screening for abdominal aortic aneurysm (AAA). Diabetes Have regular diabetes screenings. This checks your fasting blood sugar level. Have the screening done:  Once every three years after age 74 if you are at a  normal weight and have a low risk for diabetes.  More often and at a younger age if you are overweight or have a high risk for diabetes. What should I know about preventing infection? Hepatitis B If you have a higher risk for hepatitis B, you should be screened for this virus. Talk with your health care provider to find out if you are at risk for hepatitis B infection. Hepatitis C Blood testing is recommended for:  Everyone born from [birth year] through 1965.  Anyone with known risk factors for hepatitis C. Sexually transmitted infections (STIs)  You should be screened each year for STIs, including gonorrhea and chlamydia, if: ? You are sexually active and are younger than 64 years of age. ? You are older than 64 years of age and your health care provider tells you that you are at risk for this type of infection. ? Your sexual activity has changed since you were last screened, and you are at increased risk for chlamydia or gonorrhea. Ask your health care provider if you are at risk.  Ask your health care provider about whether you are at high risk for HIV. Your health care provider may recommend a prescription medicine to help prevent HIV infection. If you choose to take medicine to prevent HIV, you should first get tested for HIV. You should then be tested every 3 months for as long as you are taking the medicine. Follow these instructions at home: Lifestyle  Do not use any products that contain nicotine or tobacco, such as cigarettes, e-cigarettes, and chewing tobacco. If you need help quitting, ask your health care provider.  Do not use street drugs.  Do not share needles.  Ask your health care provider for help if you need support or information about quitting drugs. Alcohol use  Do not drink alcohol if your health care provider tells you not to drink.  If you drink alcohol: ? Limit how much you have to 0-2 drinks a day. ?  Be aware of how much alcohol is in your drink. In the [place]  one drink equals one 12 oz bottle of beer (355 mL), one 5 oz glass of wine (148 mL), or one 1 oz glass of hard liquor (44 mL). General instructions  Schedule regular health, dental, and eye exams.  Stay current with your vaccines.  Tell your health care provider if: ? You often feel depressed. ? You have ever been abused or do not feel safe at home. Summary  Adopting a healthy lifestyle and getting preventive care are important in promoting health and wellness.  Follow your health care provider's instructions about healthy diet, exercising, and getting tested or screened for diseases.  Follow your health care provider's instructions on monitoring your cholesterol and blood pressure. This information is not intended to replace advice given to you by your health care provider. Make sure you discuss any questions you have with your health care provider. Document Revised: 12/01/2018 Document Reviewed: 12/01/2018 Elsevier Patient Education  2020 [place] Kaytlan Behrman M.D.

## 2020-12-18 NOTE — Progress Notes (Signed)
Results are normal except blood sugar borderline but no diabetes Cholesterol LDL is good triglycerides are still borderline up Attention to diet and exercise continue medication.

## 2021-01-02 DIAGNOSIS — B353 Tinea pedis: Secondary | ICD-10-CM | POA: Diagnosis not present

## 2021-01-02 DIAGNOSIS — L300 Nummular dermatitis: Secondary | ICD-10-CM | POA: Diagnosis not present

## 2021-01-09 ENCOUNTER — Telehealth: Payer: Self-pay | Admitting: Cardiology

## 2021-01-09 NOTE — Telephone Encounter (Signed)
Yes.  Ok to lift light weights dumbells and walk.

## 2021-01-09 NOTE — Telephone Encounter (Signed)
Reviewed results of echocardiogram.   Patient would like to know if he is okay to exercise. States he would like to walk and lift weights. Will forward to MD to review and advise on which activities he may participate in.

## 2021-01-09 NOTE — Telephone Encounter (Signed)
New message:     Patient wife calling stating that the patient need some one to call concering results. Patient number [phone]

## 2021-01-10 NOTE — Telephone Encounter (Signed)
Advised patient, verbalized understanding  

## 2021-05-20 ENCOUNTER — Other Ambulatory Visit: Payer: Self-pay | Admitting: Cardiology

## 2021-05-20 DIAGNOSIS — Z9889 Other specified postprocedural states: Secondary | ICD-10-CM

## 2021-10-18 DIAGNOSIS — J209 Acute bronchitis, unspecified: Secondary | ICD-10-CM | POA: Diagnosis not present

## 2021-11-18 ENCOUNTER — Other Ambulatory Visit: Payer: Self-pay | Admitting: Cardiology

## 2021-11-18 DIAGNOSIS — Z9889 Other specified postprocedural states: Secondary | ICD-10-CM

## 2021-11-19 ENCOUNTER — Telehealth: Payer: Self-pay

## 2021-11-19 NOTE — Telephone Encounter (Signed)
Pt has been added to schedule ?

## 2021-11-19 NOTE — Telephone Encounter (Signed)
I spoke with the pt and he reported chest pain and pressure when inhaling and back pain as well. Patent stated the pain started yesterday and he attempted to go to the ED the previous night but was turned away due to clinic not accepting any more pt's. Pt informed me that he was previously diagnosed with Bronchitis, x2 weeks and was seen in the ED. Pt stated that he is currently not sick at the moment. I advised the pt to seek emergency care ASAP at urgent care or ED. Pt declined to visit ED at this time as he stated "he has obligations and duties that he needs to complete". Pt reported that when he woke up this morning he had not symptoms of chest pain and does not think that his condition is emergent.

## 2021-11-19 NOTE — Telephone Encounter (Signed)
Can add on  Thursday slot

## 2021-11-19 NOTE — Telephone Encounter (Signed)
---  Caller states he has been chest pain since yesterday. It is over his right breast. It is sharp pains and has gone to left side also , and the back. When he takes a deep breath, and exhales he feels it. Also if he twists or turns he feels the pain. He went to urgent care and was treated for bronchitis 3 weeks ago. The cough is gone now pretty much, No fever.  11/19/2021 8:45:09 AM Go to ED Now Alveta Heimlich, RN, [place] Understands Yes  Comments User: Debby Bud, RN Date/Time Eilene Ghazi Time): 11/19/2021 8:47:21 AM He says he is not going to ER now. He says he is just not going. His wife is trying to make him an appointment.  User: Debby Bud, RN Date/Time Eilene Ghazi Time): 11/19/2021 8:53:06 AM Talked to Leandro Reasoner at the office and told her the patient is refusing ER and went to urgent care last night and they would not see him. The wife is asking for an appointment. Leandro Reasoner will send a message to Dr Regis Bill to see if she can work him into the schedule. Let the patient know that his dr is being notified and they will see if she can work him into schedule.  Referrals GO TO FACILITY REFUSED  11/19/21 1521: See previous notes for this encounter.

## 2021-11-19 NOTE — Telephone Encounter (Signed)
Patient called for appt and no appts were available so patient was transferred to a Triage nurse and was advised to go to ED or Urgent care but patient refused and is asking to be seen by his PCP today.

## 2021-11-20 NOTE — Progress Notes (Signed)
Chief Complaint  Patient presents with   Chest Pain    Patient complains of chest pain, x3 days, Patient complains of pain during inhalation,     HPI: Jeremy Wolf 65 y.o. come in for new problem see phone notes. Had a few days insidious onset of pain when he took a deep breath underneath his rib cage around to his back.  Feels that his lower lung area there is no coughing may have happened after some strong sense inhaled.  A few weeks ago was seen at urgent care for bronchitis and given some type of antibiotic.  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   Also has been having tinnitus ringing in his ear uncertain getting worse been there for a while no specific trigger although happened fairly suddenly Has follow-up CPX at the end of the month. No cardiovascular symptoms.  ROS: See pertinent positives and negatives per HPI.  Past Medical History:  Diagnosis Date   Allergic rhinitis    Allergy    Atrial fibrillation ([place]) 02/10/2009   Centricity Description: FIBRILLATION, ATRIAL Qualifier: Diagnosis of  By: Percival Spanish, MD, Farrel Gordon   Centricity Description: ATRIAL FIBRILLATION Qualifier: Diagnosis of  By: Sarajane Jews MD, Ishmael Holter    Blood transfusion without reported diagnosis 2010   after heart surgery   CAD (coronary artery disease)    a. cath 12/09: oD1 50%, EF 65%;  b. negative ETT 7/11   Cataract    removed left eye with lens implant    Constipation    uses Miralax   History of atrial fibrillation    post op AFib   Hx of colonic polyps    Hyperlipidemia    Mitral valve prolapse    a. with severe regurgitation; s/p MV repair 2/10;  b. 07/2011 Echo: EF 50-55%, mild MR.   Nephrolithiasis    hx of   Patent foramen ovale    a. s/p closure at MV repair in 2010    Plantar fasciitis of left foot 07/06/2012   Premature birth ?    twin  was in hosp 30 days feeding lazy eye small weight    Rheumatic fever with heart involvement    MVP per pt due to this with repair 2010   Sleep apnea    no cpap    Family History  Problem Relation Age of Onset   Diabetes Mother    Hyperlipidemia Mother    Hyperlipidemia Father    Heart disease Father        heart surgery 75 ? cabd and valve   Cancer Daughter 47       ovarian   Lung cancer Maternal Grandfather        lung   Emphysema Maternal Grandfather    Arthritis Paternal Grandmother    Arthritis Paternal Grandfather    Rheumatic fever Unknown        2 sisters   Other Sister        twin hx of substance use   Colon cancer Neg Hx    Colon polyps Neg Hx    Esophageal cancer Neg Hx    Rectal cancer Neg Hx    Stomach cancer Neg Hx  Social History   Socioeconomic History   Marital status: Married    Spouse name: Not on file   Number of children: 5   Years of education: Not on file   Highest education level: Not on file  Occupational History   Occupation: Truck Geophysicist/field seismologist  Tobacco Use   Smoking status: Never   Smokeless tobacco: Never  Vaping Use   Vaping Use: Never used  Substance and Sexual Activity   Alcohol use: Yes    Alcohol/week: 0.0 standard drinks    Comment: OCC. 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    Sleeps; 7- 8 hours   Married (second marriage) 4 children   Regular exercise- no    Married summer 2007   caffeine 10-12 cups coffee   HH of 2   No pets   Originally from the [employer] [place]   Was a twin birth midly  Premature  Fraternal  Twin sister    Social Determinants of Radio broadcast assistant Strain: Not on file  Food Insecurity: Not on file  Transportation Needs: Not on file   Physical Activity: Not on file  Stress: Not on file  Social Connections: Not on file    Outpatient Medications Prior to Visit  Medication Sig Dispense Refill   aspirin EC 81 MG tablet Take 81 mg by mouth daily.     Misc Natural Products (RED WINE EXTRACT) CAPS Take by mouth.     sildenafil (VIAGRA) 50 MG tablet Take 1 tablet (50 mg total) by mouth daily as needed for erectile dysfunction. 30 tablet 3   simvastatin (ZOCOR) 20 MG tablet Take 20 mg by mouth daily.     rosuvastatin (CRESTOR) 20 MG tablet TAKE 1 TABLET BY MOUTH DAILY . GENERIC EQUIVALENT FOR CRESTOR 90 tablet 3   No facility-administered medications prior to visit.     EXAM:  BP 132/82 (BP Location: Left Arm, Patient Position: Sitting, Cuff Size: Normal)   Pulse 83   Temp 98.6 F (37 C) (Oral)   Ht 5\' 9"  (1.753 m)   Wt 204 lb 3.2 oz (92.6 kg)   SpO2 97%   BMI 30.16 kg/m   Body mass index is 30.16 kg/m.  GENERAL: vitals reviewed and listed above, alert, oriented, appears well hydrated and in no acute distress HEENT: atraumatic, conjunctiva  clear, no obvious abnormalities on inspection of external nose and ears OP : masked  NECK: no obvious masses on inspection palpation  LUNGS: clear to auscultation bilaterally, no wheezes, rales or rhonchi, good air movement chest wall no point tenderness but points to right anterior cc junction  CV: HRRR,ocass premature beat no m heard  no clubbing cyanosis or  peripheral edema nl cap refill  MS: moves all extremities without noticeable focal  abnormality PSYCH: pleasant and cooperative, no obvious depression or anxiety Lab Results  Component Value Date   WBC 5.8 12/17/2020   HGB 14.4 12/17/2020   HCT 42.4 12/17/2020   PLT 188.0 12/17/2020   GLUCOSE 104 (H) 12/17/2020   CHOL 164 12/17/2020   TRIG 156.0 (H) 12/17/2020   HDL 49.40 12/17/2020   LDLDIRECT 154.7 07/12/2013   LDLCALC 84 12/17/2020   ALT 22 12/17/2020   AST 17 12/17/2020  NA 137 12/17/2020   K 4.0  12/17/2020   CL 102 12/17/2020   CREATININE 0.91 12/17/2020   BUN 13 12/17/2020   CO2 26 12/17/2020   TSH 3.08 12/17/2020   PSA 0.61 12/17/2020   INR 2.0 05/23/2009   HGBA1C 5.5 10/19/2018   BP Readings from Last 3 Encounters:  11/21/21 132/82  12/17/20 126/84  10/26/20 122/84   EKG NA findings  av block rate variation  63    ASSESSMENT AND PLAN:  Discussed the following assessment and plan:  Chest pain, unspecified type - dosent sound cardiac and could be post bronchtitis infection although atypical - Plan: EKG 12-Lead, DG Chest 2 View  Ringing in ears, unspecified laterality - unrelated   consdier ent  initial eval to r/o progressive problem    Expectant management. And time   fu pren worse  or at next appt  Will place future orders if wishes lab pre visit -Patient advised to return or notify health care team  if  new concerns arise.  Patient Instructions    This pain could have been some residual from the previous bronchitis.   But get  chest x ray  today .   To make sure no surprises   Ringing in ears common  ear protection.  Consider   ent evaluation   to make sure no serious  problem .     Standley Brooking. Joselyn Edling M.D.

## 2021-11-21 ENCOUNTER — Ambulatory Visit: Payer: BC Managed Care – PPO | Admitting: Internal Medicine

## 2021-11-21 ENCOUNTER — Other Ambulatory Visit: Payer: Self-pay

## 2021-11-21 ENCOUNTER — Encounter: Payer: Self-pay | Admitting: Internal Medicine

## 2021-11-21 ENCOUNTER — Ambulatory Visit (INDEPENDENT_AMBULATORY_CARE_PROVIDER_SITE_OTHER): Payer: BC Managed Care – PPO

## 2021-11-21 ENCOUNTER — Other Ambulatory Visit: Payer: Self-pay | Admitting: Internal Medicine

## 2021-11-21 VITALS — BP 132/82 | HR 83 | Temp 98.6°F | Ht 69.0 in | Wt 204.2 lb

## 2021-11-21 DIAGNOSIS — Z9889 Other specified postprocedural states: Secondary | ICD-10-CM

## 2021-11-21 DIAGNOSIS — E785 Hyperlipidemia, unspecified: Secondary | ICD-10-CM

## 2021-11-21 DIAGNOSIS — H9319 Tinnitus, unspecified ear: Secondary | ICD-10-CM

## 2021-11-21 DIAGNOSIS — Z8709 Personal history of other diseases of the respiratory system: Secondary | ICD-10-CM | POA: Diagnosis not present

## 2021-11-21 DIAGNOSIS — I441 Atrioventricular block, second degree: Secondary | ICD-10-CM

## 2021-11-21 DIAGNOSIS — Z981 Arthrodesis status: Secondary | ICD-10-CM | POA: Diagnosis not present

## 2021-11-21 DIAGNOSIS — R079 Chest pain, unspecified: Secondary | ICD-10-CM | POA: Diagnosis not present

## 2021-11-21 DIAGNOSIS — Z79899 Other long term (current) drug therapy: Secondary | ICD-10-CM

## 2021-11-21 DIAGNOSIS — Z125 Encounter for screening for malignant neoplasm of prostate: Secondary | ICD-10-CM

## 2021-11-21 NOTE — Progress Notes (Signed)
Good news  no significant abnormality or acute cardiopulmonary findings on your chest x-ray today.

## 2021-11-21 NOTE — Patient Instructions (Signed)
   This pain could have been some residual from the previous bronchitis.   But get  chest x ray  today .   To make sure no surprises   Ringing in ears common  ear protection.  Consider   ent evaluation   to make sure no serious  problem .

## 2021-12-18 ENCOUNTER — Other Ambulatory Visit: Payer: Self-pay

## 2021-12-18 ENCOUNTER — Ambulatory Visit (INDEPENDENT_AMBULATORY_CARE_PROVIDER_SITE_OTHER): Payer: BC Managed Care – PPO | Admitting: Internal Medicine

## 2021-12-18 ENCOUNTER — Encounter: Payer: Self-pay | Admitting: Internal Medicine

## 2021-12-18 VITALS — BP 116/80 | HR 73 | Temp 98.2°F | Ht 69.0 in | Wt 204.8 lb

## 2021-12-18 DIAGNOSIS — Z0000 Encounter for general adult medical examination without abnormal findings: Secondary | ICD-10-CM | POA: Diagnosis not present

## 2021-12-18 DIAGNOSIS — Z79899 Other long term (current) drug therapy: Secondary | ICD-10-CM

## 2021-12-18 DIAGNOSIS — Z125 Encounter for screening for malignant neoplasm of prostate: Secondary | ICD-10-CM | POA: Diagnosis not present

## 2021-12-18 DIAGNOSIS — Z9889 Other specified postprocedural states: Secondary | ICD-10-CM

## 2021-12-18 DIAGNOSIS — H9319 Tinnitus, unspecified ear: Secondary | ICD-10-CM

## 2021-12-18 DIAGNOSIS — E785 Hyperlipidemia, unspecified: Secondary | ICD-10-CM

## 2021-12-18 DIAGNOSIS — L84 Corns and callosities: Secondary | ICD-10-CM | POA: Diagnosis not present

## 2021-12-18 DIAGNOSIS — I441 Atrioventricular block, second degree: Secondary | ICD-10-CM

## 2021-12-18 DIAGNOSIS — Z23 Encounter for immunization: Secondary | ICD-10-CM | POA: Diagnosis not present

## 2021-12-18 LAB — LIPID PANEL
Cholesterol: 182 mg/dL (ref 0–200)
HDL: 54 mg/dL (ref >39.00)
LDL Cholesterol: 107 mg/dL — ABNORMAL HIGH (ref 0–99)
NonHDL: 128.31
Total CHOL/HDL Ratio: 3
Triglycerides: 107 mg/dL (ref 0.0–149.0)
VLDL: 21.4 mg/dL (ref 0.0–40.0)

## 2021-12-18 LAB — HEPATIC FUNCTION PANEL
ALT: 23 U/L (ref 0–53)
AST: 20 U/L (ref 0–37)
Albumin: 4.5 g/dL (ref 3.5–5.2)
Alkaline Phosphatase: 65 U/L (ref 39–117)
Bilirubin, Direct: 0.1 mg/dL (ref 0.0–0.3)
Total Bilirubin: 0.7 mg/dL (ref 0.2–1.2)
Total Protein: 7.1 g/dL (ref 6.0–8.3)

## 2021-12-18 LAB — CBC WITH DIFFERENTIAL/PLATELET
Basophils Absolute: 0 10*3/uL (ref 0.0–0.1)
Basophils Relative: 0.8 % (ref 0.0–3.0)
Eosinophils Absolute: 0.2 10*3/uL (ref 0.0–0.7)
Eosinophils Relative: 3.5 % (ref 0.0–5.0)
HCT: 42.9 % (ref 39.0–52.0)
Hemoglobin: 14.3 g/dL (ref 13.0–17.0)
Lymphocytes Relative: 38.6 % (ref 12.0–46.0)
Lymphs Abs: 2.3 10*3/uL (ref 0.7–4.0)
MCHC: 33.3 g/dL (ref 30.0–36.0)
MCV: 87.6 fl (ref 78.0–100.0)
Monocytes Absolute: 0.4 10*3/uL (ref 0.1–1.0)
Monocytes Relative: 7.1 % (ref 3.0–12.0)
Neutro Abs: 3 10*3/uL (ref 1.4–7.7)
Neutrophils Relative %: 50 % (ref 43.0–77.0)
Platelets: 210 10*3/uL (ref 150.0–400.0)
RBC: 4.89 Mil/uL (ref 4.22–5.81)
RDW: 13.3 % (ref 11.5–15.5)
WBC: 6.1 10*3/uL (ref 4.0–10.5)

## 2021-12-18 LAB — BASIC METABOLIC PANEL
BUN: 16 mg/dL (ref 6–23)
CO2: 27 mEq/L (ref 19–32)
Calcium: 9.7 mg/dL (ref 8.4–10.5)
Chloride: 103 mEq/L (ref 96–112)
Creatinine, Ser: 0.94 mg/dL (ref 0.40–1.50)
GFR: 85.35 mL/min (ref >60.00)
Glucose, Bld: 96 mg/dL (ref 70–99)
Potassium: 5 mEq/L (ref 3.5–5.1)
Sodium: 140 mEq/L (ref 135–145)

## 2021-12-18 LAB — TSH: TSH: 2.18 u[IU]/mL (ref 0.35–5.50)

## 2021-12-18 LAB — PSA: PSA: 1.46 ng/mL (ref 0.10–4.00)

## 2021-12-18 NOTE — Patient Instructions (Addendum)
Good to see you today .   If ringing getting worse  get another audiology evaluation.  Continue to avoid mitigate  loud noises .  Increase  moisturizing  of feet :  soak and then  can [place]  pummice to pare down the callous  carefully  add moisturizing ointment  eg  plain  eucerin aquaphor  other otcs available (even vege oil vaseline  ) at bed time with sock  .  For 3-4 weeks and see if helps callous on feet . If getting worse with pain or other consider getting back with dermatology .     Can get prevnar 20  any time or at pharmacy  Flu vaccine today

## 2021-12-18 NOTE — Progress Notes (Signed)
Chief Complaint  Patient presents with   Annual Exam    fasting    HPI: Patient  Jeremy Wolf  65 y.o. comes in today for Preventive Health Care visit  No major changes in health. HLD taking simvastatin aspirin Sees cardiology not recently no cardiovascular symptoms. Still has ringing in ears not a lot of change. Area on light right lower extremity Dr. Ronnald Wolf dermatology Jeremy Wolf a while back cream did not help uses Neosporin helps some.  Health Maintenance  Topic Date Due   HIV Screening  Never done   Pneumonia Vaccine 65+ Years old (1 - PCV) 06/18/2022 (Originally 12/04/1962)   COVID-19 Vaccine (1) 06/18/2022 (Originally 06/04/1957)   Zoster Vaccines- Shingrix (1 of 2) 06/18/2022 (Originally 12/05/1975)   TETANUS/TDAP  07/20/2023   COLONOSCOPY (Pts 45-61yrs Insurance coverage will need to be confirmed)  10/16/2023   INFLUENZA VACCINE  Completed   Hepatitis C Screening  Completed   HPV VACCINES  Aged Out   Health Maintenance Review LIFESTYLE:  Exercise:   work related  Tobacco/ETS:  n Alcohol:  n Sugar beverages:  recent  daily coffee  Jeremy Wolf  Sleep: est 8+ Drug use: no HH of  2   1 cat  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 REST of 12 system review negative except as per HPI   Past Medical History:  Diagnosis Date   Allergic rhinitis    Allergy    Atrial fibrillation ([place]) 02/10/2009   Centricity Description: FIBRILLATION, ATRIAL Qualifier: Diagnosis of  By: Jeremy Spanish, MD, Jeremy Wolf   Centricity Description: ATRIAL FIBRILLATION Qualifier: Diagnosis of  By: Jeremy Jews MD, Jeremy Wolf    Blood transfusion without reported diagnosis 2010   after heart surgery   CAD (coronary artery disease)    a. cath 12/09: oD1 50%, EF 65%;  b. negative ETT 7/11   Cataract    removed left eye with lens implant    Constipation    uses Miralax   History of atrial  fibrillation    post op AFib   Hx of colonic polyps    Hyperlipidemia    Mitral valve prolapse    a. with severe regurgitation; s/p MV repair 2/10;  b. 07/2011 Echo: EF 50-55%, mild MR.   Nephrolithiasis    hx of   Patent foramen ovale    a. s/p closure at MV repair in 2010   Plantar fasciitis of left foot 07/06/2012   Premature birth ?    twin  was in hosp 30 days feeding lazy eye small weight    Rheumatic fever with heart involvement    MVP per pt due to this with repair 2010   Sleep apnea    no cpap    Past Surgical History:  Procedure Laterality Date   COLONOSCOPY     HERNIA REPAIR Right 2012   Inguinal 3 years ago   PATENT FORAMEN OVALE CLOSURE  2010   POLYPECTOMY     SPINE SURGERY  12/09/2010   vertebra 5 & 6   THORACOTOMY  2010   Rt miniature for mitral valve repair, quadrangular resection of posterior leaftlet with transposition of  native chordae tendineae x 2 and 73mm Jeremy Wolf ring annuloplasty    Family History  Problem Relation Age of Onset   Diabetes Mother    Hyperlipidemia Mother    Hyperlipidemia  Father    Heart disease Father        heart surgery 89 ? cabd and valve   Cancer Daughter 61       ovarian   Lung cancer Maternal Grandfather        lung   Emphysema Maternal Grandfather    Arthritis Paternal Grandmother    Arthritis Paternal Grandfather    Rheumatic fever Unknown        2 sisters   Other Sister        twin hx of substance use   Colon cancer Neg Hx    Colon polyps Neg Hx    Esophageal cancer Neg Hx    Rectal cancer Neg Hx    Stomach cancer Neg Hx     Social History   Socioeconomic History   Marital status: Married    Spouse name: Not on file   Number of children: 5   Years of education: Not on file   Highest education level: Not on file  Occupational History   Occupation: Truck Geophysicist/field seismologist  Tobacco Use   Smoking status: Never   Smokeless tobacco: Never  Vaping Use   Vaping Use: Never used  Substance and Sexual Activity    Alcohol use: Yes    Alcohol/week: 0.0 standard drinks    Comment: OCC. 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    Sleeps; 7- 8 hours   Married (second marriage) 4 children   Regular exercise- no    Married summer 2007   caffeine 10-12 cups coffee   HH of 2   No pets   Originally from the [employer]   Was a twin birth midly  Premature  Fraternal  Twin sister    Social Determinants of Radio broadcast assistant Strain: Not on file  Food Insecurity: Not on file  Transportation Needs: Not on file  Physical Activity: Not on file  Stress: Not on file  Social Connections: Not on file    Outpatient Medications Prior to Visit  Medication Sig Dispense Refill   aspirin EC 81 MG tablet Take 81 mg by mouth daily.     Misc Natural Products (RED WINE EXTRACT) CAPS Take by mouth.     sildenafil (VIAGRA) 50 MG tablet Take 1 tablet (50 mg total) by mouth daily as needed for erectile dysfunction. 30 tablet 3   simvastatin (ZOCOR) 20 MG tablet Take 20 mg by mouth daily.     No facility-administered medications prior to visit.     EXAM:  BP 116/80 (BP Location: Left Arm, Patient Position: Sitting, Cuff Size: Normal)    Pulse 73    Temp 98.2 F (36.8 C) (Oral)    Ht 5\' 9"  (1.753 m)    Wt 204 lb 12.8 oz (92.9 kg)    SpO2 99%    BMI 30.24 kg/m   Body mass index is 30.24 kg/m. Wt Readings from Last 3 Encounters:  12/18/21 204 lb 12.8 oz (92.9 kg)  11/21/21 204 lb 3.2 oz (92.6 kg)  12/17/20 202 lb 9.6 oz (91.9 kg)    Physical Exam: Vital signs reviewed UTM:LYYT is a well-developed well-nourished alert cooperative    who appearsr stated age in no acute distress.  HEENT:  normocephalic atraumatic , Eyes: PERRL EOM's full, conjunctiva clear, Nares: paten,t no deformity discharge or tenderness.,  Ears: no deformity EAC's clear TMs with normal landmarks.  CHEST/PULM:  Clear to auscultation and percussion breath sounds equal no wheeze , rales or rhonchi. No chest wall deformities or tenderness.  CV: PMI is nondisplaced, S1 S2 no gallops, murmurs, rubs. Peripheral pulses are full without delay.No JVD .  ABDOMEN: Bowel sounds normal nontender  No guard or rebound, no hepato splenomegal no CVA tenderness.  Extremtities:  No clubbing cyanosis or edema, no acute joint swelling or redness no focal atrophy NEURO:  Oriented x3, cranial nerves 3-12 appear to be intact, no obvious focal weakness,gait within normal limits no abnormal reflexes or asymmetrical SKIN: No acute rashes normal turgor, color, no bruising or petechiae.  Right lower extremity scaly patch.  Feet normal capillary refill bilateral heel hyperkeratotic with a little bit of fissuring no weeping no lesions.  Great toes thickened. PSYCH: Oriented, good eye contact, no obvious depression anxiety, cognition and judgment appear normal. LN: no cervical axillary inguinal adenopathy  Lab Results  Component Value Date   WBC 5.8 12/17/2020   HGB 14.4 12/17/2020   HCT 42.4 12/17/2020   PLT 188.0 12/17/2020   GLUCOSE 104 (H) 12/17/2020   CHOL 164 12/17/2020   TRIG 156.0 (H) 12/17/2020   HDL 49.40 12/17/2020   LDLDIRECT 154.7 07/12/2013   LDLCALC 84 12/17/2020   ALT 22 12/17/2020   AST 17 12/17/2020   NA 137 12/17/2020   K 4.0 12/17/2020   CL 102 12/17/2020   CREATININE 0.91 12/17/2020   BUN 13 12/17/2020   CO2 26 12/17/2020   TSH 3.08 12/17/2020   PSA 0.61 12/17/2020   INR 2.0 05/23/2009   HGBA1C 5.5 10/19/2018    BP Readings from Last 3 Encounters:  12/18/21 116/80  11/21/21 132/82  12/17/20 126/84    Lab plan reviewed with patient   ASSESSMENT AND PLAN:  Discussed the following assessment and plan:    ICD-10-CM   1. Visit for preventive health examination  Z00.00 Flu Vaccine QUAD High Dose(Fluad)    2.  Hyperlipidemia, unspecified hyperlipidemia type  E78.5 PSA    TSH    Lipid panel    Hepatic function panel    CBC with Differential/Platelet    Basic metabolic panel    3. Medication management  Z79.899 PSA    TSH    Lipid panel    Hepatic function panel    CBC with Differential/Platelet    Basic metabolic panel    4. S/P mitral valve repair  Z98.890 PSA    TSH    Lipid panel    Hepatic function panel    CBC with Differential/Platelet    Basic metabolic panel    5. Foot callus  L84     6. Screening PSA (prostate specific antigen)  Z12.5 PSA    TSH    Lipid panel    Hepatic function panel    CBC with Differential/Platelet    Basic metabolic panel    7. Ringing in ears, unspecified laterality  H93.19 PSA    TSH    Lipid panel    Hepatic function panel    CBC with Differential/Platelet    Basic metabolic panel    8. AV block, Mobitz 1  I44.1 PSA    TSH    Lipid panel    Hepatic function panel    CBC with Differential/Platelet    Basic metabolic panel  Foot care discussed. Considering getting back with dermatologist or podiatry if needed. MI at 54 but had his boots on for weighing lose 5 pounds in a healthy manner will be helpful for future health. Flu vaccine this day discussed getting Prevnar 20 here or at pharmacy. Follow-up with any symptoms of concern in interim before next year. Return in about 1 year (around 12/18/2022) for depending on results.  Patient Care Team: Lashica Hannay, Standley Brooking, MD as PCP - General Minus Breeding, MD as PCP - Cardiology (Cardiology) Minus Breeding, MD (Cardiology) Newman Pies, MD (Neurosurgery) Loletha Carrow Kirke Corin, MD as Consulting Physician (Gastroenterology) Danella Sensing, MD as Consulting Physician (Dermatology) Patient Instructions  Good to see you today .   If ringing getting worse  get another audiology evaluation.  Continue to avoid mitigate  loud noises .  Increase  moisturizing  of feet :  soak and then  can [place]   pummice to pare down the callous  carefully  add moisturizing ointment  eg  plain  eucerin aquaphor  other otcs available (even vege oil vaseline  ) at bed time with sock  .  For 3-4 weeks and see if helps callous on feet . If getting worse with pain or other consider getting back with dermatology .     Can get prevnar 20  any time or at pharmacy  Flu vaccine today     Standley Brooking. Isabel Ardila M.D.

## 2021-12-20 NOTE — Progress Notes (Signed)
Results stable  except ldl up over 100 (goal below 100 better at 70 range ).  Continue med and   Intensify lifestyle interventions.  And  healthy  modest weight loss  as we discussed should help improve number  in addition to medication

## 2022-02-18 ENCOUNTER — Other Ambulatory Visit: Payer: Self-pay | Admitting: Internal Medicine

## 2022-02-18 NOTE — Telephone Encounter (Signed)
Patient's wife called because patient has switched to medicare now and all his [facility]. Patient needs a refill on simvastatin (ZOCOR) 20 MG tablet    Please send to  [id number] [place], [place] [address]. Phone:  [phone]  Fax:  [phone]        Please advise

## 2022-02-19 MED ORDER — SIMVASTATIN 20 MG PO TABS: 20.0000 mg | ORAL_TABLET | Freq: Every day | ORAL | 2 refills | Status: DC

## 2022-02-19 NOTE — Telephone Encounter (Signed)
RX sent. I spoke with the pt and he stated he was unsure of current dosage and who prescribed rx. Based on records pt was last prescribed Crestor 20 mg on 11/18/2021 and Simvastatin 40 mg on 11/04/2018.  ?

## 2022-02-19 NOTE — Telephone Encounter (Signed)
Please clarify as the med list says that Dr. Percival Spanish prescribed Crestor 10 mg a day.  And then was placed on simvastatin. ?Can go ahead and refill the simvastatin but please clarify who was the last prescriber.  It is not clear in the record. ?

## 2022-02-26 ENCOUNTER — Telehealth: Payer: Self-pay | Admitting: Cardiology

## 2022-02-26 NOTE — Telephone Encounter (Signed)
Dentist office reaching out to provide pharmacy info for SBE prophylaxis ? ?Please send to : ? ?[facility] ?[phone] ?

## 2022-02-26 NOTE — Telephone Encounter (Signed)
? ?  Primary Cardiologist: Minus Breeding, MD ? ?Chart reviewed as part of pre-operative protocol coverage. Simple dental extractions are considered low risk procedures per guidelines and generally do not require any specific cardiac clearance. It is also generally accepted that for simple extractions and dental cleanings, there is no need to interrupt blood thinner therapy.  ? ?SBE prophylaxis is required for the patient. ? ?I will route this recommendation to the requesting party via Epic fax function and remove from pre-op pool. ? ?Please call with questions. ? ?Emmaline Life, NP-C ? ?  ?02/26/2022, 4:32 PM ?[facility] ?[address] ?Office [phone] Fax [phone] ? ? ? ?

## 2022-02-26 NOTE — Telephone Encounter (Signed)
? ?  Pre-operative Risk Assessment  ?  ?Patient Name: Jeremy Wolf  ?DOB: 1956-06-22 ?MRN: [id number]  ? ? ? ?Request for Surgical Clearance   ? ?Procedure:   cleaning  ? ?Date of Surgery:  Clearance 02/27/22                              ?   ?Surgeon:  Dr. Ronnald Ramp ?Surgeon's Group or Practice Name:  [facility]  ?Phone number:  [phone] ?Fax number:  [phone] ?  ?Type of Clearance Requested:   ?- Medical  ?  ?Type of Anesthesia:  None  ?  ?Additional requests/questions:     ? ?Signed, ?Milbert Coulter   ?02/26/2022, 4:26 PM  ? ?

## 2022-02-27 ENCOUNTER — Other Ambulatory Visit: Payer: Self-pay | Admitting: Internal Medicine

## 2022-02-27 DIAGNOSIS — I341 Nonrheumatic mitral (valve) prolapse: Secondary | ICD-10-CM

## 2022-02-27 MED ORDER — AMOXICILLIN 500 MG PO CAPS
1000.0000 mg | ORAL_CAPSULE | Freq: Once | ORAL | 0 refills | Status: AC
Start: 2022-02-27 — End: 2022-02-27

## 2022-02-27 NOTE — Telephone Encounter (Signed)
Looks like Dr. Stanton Kidney branch sent in SBE today for the pt.  ?

## 2022-02-27 NOTE — Telephone Encounter (Signed)
Spoke with Drema from Dr. Junius Argyle office she states the pt is already in the chair having the cleaning done. Their office called yesterday and thought the antibiotic was called in yesterday. Spoke with DOD, Dr. Harl Bowie, pt is to take Amoxicillin 2,000 mg one time after the procedure to prevent infection.  [facility]  ?

## 2022-02-27 NOTE — Telephone Encounter (Signed)
Please see below, patient is at the dentist office  ?

## 2022-02-28 ENCOUNTER — Telehealth: Payer: Self-pay | Admitting: Cardiology

## 2022-02-28 MED ORDER — ROSUVASTATIN CALCIUM 20 MG PO TABS: 20.0000 mg | ORAL_TABLET | Freq: Every day | ORAL | 0 refills | Status: DC

## 2022-02-28 NOTE — Telephone Encounter (Signed)
Spoke to patient's wife she stated husband has been taking Rosuvastatin 20 mg daily for the past 2 years.He no longer takes Simvastatin.Advised he is past due to see Dr.Hochrein.Follow up appointment scheduled with Dr.Hochrein 3/30 at 8:00 am.Stated he had complete labs done with PCP this past December.Labs results in patient's chart.Rosuvastatin refill sent to pharmacy.  ?

## 2022-02-28 NOTE — Telephone Encounter (Signed)
Pt c/o medication issue: ? ?1. Name of Medication: rosuvastatin  ? ?2. How are you currently taking this medication (dosage and times per day)? 1 tablet by mouth daily  ? ?3. Are you having a reaction (difficulty breathing--STAT)? no ? ?4. What is your medication issue? Wife was calling to get a refill but its not listed under patient current medication. Please advise  ? ?

## 2022-03-19 DIAGNOSIS — M7989 Other specified soft tissue disorders: Secondary | ICD-10-CM | POA: Insufficient documentation

## 2022-03-19 DIAGNOSIS — Z8249 Family history of ischemic heart disease and other diseases of the circulatory system: Secondary | ICD-10-CM | POA: Insufficient documentation

## 2022-03-19 DIAGNOSIS — I2510 Atherosclerotic heart disease of native coronary artery without angina pectoris: Secondary | ICD-10-CM | POA: Insufficient documentation

## 2022-03-19 DIAGNOSIS — N529 Male erectile dysfunction, unspecified: Secondary | ICD-10-CM | POA: Insufficient documentation

## 2022-03-19 NOTE — Progress Notes (Signed)
?  ?Cardiology Office Note ? ? ?Date:  03/20/2022  ? ?ID:  Jeremy Wolf, DOB [birth year], MRN [id number] ? ?PCP:  Burnis Medin, MD  ?Cardiologist:   Minus Breeding, MD ? ? ?Chief Complaint  ?Patient presents with  ? MVR  ? ?  ?History of Present Illness: ?Jeremy Wolf is a 66 y.o. male who presents for followup of mitral valve repair.  Since I last saw him he has done okay.   The patient denies any new symptoms such as chest discomfort, neck or arm discomfort. There has been no new shortness of breath, PND or orthopnea. There have been no reported palpitations, presyncope or syncope.  He still works full-time. ? ? ?Past Medical History:  ?Diagnosis Date  ? Allergic rhinitis   ? Allergy   ? Atrial fibrillation ([place]) 02/10/2009  ? Centricity Description: FIBRILLATION, ATRIAL Qualifier: Diagnosis of  By: Percival Spanish, MD, Farrel Gordon   Centricity Description: ATRIAL FIBRILLATION Qualifier: Diagnosis of  By: Sarajane Jews MD, Ishmael Holter   ? Blood transfusion without reported diagnosis 2010  ? after heart surgery  ? CAD (coronary artery disease)   ? a. cath 12/09: oD1 50%, EF 65%;  b. negative ETT 7/11  ? Cataract   ? removed left eye with lens implant   ? Constipation   ? uses Miralax  ? History of atrial fibrillation   ? post op AFib  ? Hx of colonic polyps   ? Hyperlipidemia   ? Mitral valve prolapse   ? a. with severe regurgitation; s/p MV repair 2/10;  b. 07/2011 Echo: EF 50-55%, mild MR.  ? Nephrolithiasis   ? hx of  ? Patent foramen ovale   ? a. s/p closure at MV repair in 2010  ? Plantar fasciitis of left foot 07/06/2012  ? Premature birth ?   ? twin  was in hosp 30 days feeding lazy eye small weight   ? Rheumatic fever with heart involvement   ? MVP per pt due to this with repair 2010  ? Sleep apnea   ? no cpap  ? ? ?Past Surgical History:  ?Procedure Laterality Date  ? COLONOSCOPY    ? HERNIA REPAIR Right 2012  ? Inguinal 3 years ago  ? PATENT FORAMEN OVALE CLOSURE  2010  ? POLYPECTOMY    ? SPINE SURGERY  12/09/2010  ?  vertebra 5 & 6  ? THORACOTOMY  2010  ? Rt miniature for mitral valve repair, quadrangular resection of posterior leaftlet with transposition of  native chordae tendineae x 2 and 30m Edwards Physio II ring annuloplasty  ? ? ? ?Current Outpatient Medications  ?Medication Sig Dispense Refill  ? aspirin EC 81 MG tablet Take 81 mg by mouth daily.    ? Misc Natural Products (RED WINE EXTRACT) CAPS Take by mouth.    ? rosuvastatin (CRESTOR) 20 MG tablet Take 1 tablet (20 mg total) by mouth daily. 90 tablet 0  ? sildenafil (VIAGRA) 50 MG tablet Take 1 tablet (50 mg total) by mouth daily as needed for erectile dysfunction. 10 tablet 11  ? ?No current facility-administered medications for this visit.  ? ? ?Allergies:   Patient has no known allergies.  ? ? ?ROS:  Please see the history of present illness.   Otherwise, review of systems are positive for none.   All other systems are reviewed and negative.  ? ? ?PHYSICAL EXAM: ?VS:  BP 120/82   Pulse (!) 49   Ht '5\' 9"'$  (1.753 m)  Wt 205 lb 6.4 oz (93.2 kg)   SpO2 99%   BMI 30.33 kg/m?  , BMI Body mass index is 30.33 kg/m?. ?GENERAL:  Well appearing ?NECK:  No jugular venous distention, waveform within normal limits, carotid upstroke brisk and symmetric, no bruits, no thyromegaly ?LUNGS:  Clear to auscultation bilaterally ?CHEST: Well-healed surgical scar ?HEART:  PMI not displaced or sustained,S1 and S2 within normal limits, no S3, no S4, no clicks, no rubs, no murmurs ?ABD:  Flat, positive bowel sounds normal in frequency in pitch, no bruits, no rebound, no guarding, no midline pulsatile mass, no hepatomegaly, no splenomegaly ?EXT:  2 plus pulses throughout, no edema, no cyanosis no clubbing ? ? ?EKG:  EKG is  ordered today. ?The ekg ordered today demonstrates normal sinus rhythm rate 49, left axis deviation, poor anterior R wave progression.  Nonspecific diffuse T wave flattening.  Mobitz type I noted ? ? ?Recent Labs: ?12/18/2021: ALT 23; BUN 16; Creatinine, Ser 0.94;  Hemoglobin 14.3; Platelets 210.0; Potassium 5.0; Sodium 140; TSH 2.18  ? ? ?Lipid Panel ?   ?Component Value Date/Time  ? CHOL 182 12/18/2021 1036  ? TRIG 107.0 12/18/2021 1036  ? HDL 54.00 12/18/2021 1036  ? CHOLHDL 3 12/18/2021 1036  ? VLDL 21.4 12/18/2021 1036  ? LDLCALC 107 (H) 12/18/2021 1036  ? LDLDIRECT 154.7 07/12/2013 0800  ? ?  ? ?Wt Readings from Last 3 Encounters:  ?03/20/22 205 lb 6.4 oz (93.2 kg)  ?12/18/21 204 lb 12.8 oz (92.9 kg)  ?11/21/21 204 lb 3.2 oz (92.6 kg)  ?  ? ? ?Other studies Reviewed: ?Additional studies/ records that were reviewed today include: Labs ?Review of the above records demonstrates: See elsewhere ? ? ?ASSESSMENT AND PLAN: ? ?Mitral Valve Repair -  ?This was stable in 2021.  I will repeat an echo in December 2023 ? 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  No change in therapy. ? ?ED - ?I renewed Viagra.  ? ?CAD - ?He continues with risk reduction.  He had no further chest pain.  ? ?DYSLIPIDEMIA - ?LDL is 107.  HDL 54.  ? ?LEG SWELLING - ?He has some venous insufficiency.  This is unchanged from previous.  No change in therapy.  ? ? ?Current medicines are reviewed at length with the patient today.  The patient does not have concerns regarding medicines. ? ?The following changes have been made:  None ? ?Labs/ tests ordered today include:  ? ?Orders Placed This Encounter  ?Procedures  ? EKG 12-Lead  ? ECHOCARDIOGRAM COMPLETE  ? ? ? ?Disposition:   FU with me one year.  ? ? ?Signed, ?Minus Breeding, MD  ?03/20/2022 8:27 AM    ?[place] ? ? ?

## 2022-03-20 ENCOUNTER — Encounter: Payer: Self-pay | Admitting: Cardiology

## 2022-03-20 ENCOUNTER — Ambulatory Visit: Payer: Medicare HMO | Admitting: Cardiology

## 2022-03-20 VITALS — BP 120/82 | HR 49 | Ht 69.0 in | Wt 205.4 lb

## 2022-03-20 DIAGNOSIS — N529 Male erectile dysfunction, unspecified: Secondary | ICD-10-CM

## 2022-03-20 DIAGNOSIS — Z9889 Other specified postprocedural states: Secondary | ICD-10-CM | POA: Diagnosis not present

## 2022-03-20 DIAGNOSIS — Z8249 Family history of ischemic heart disease and other diseases of the circulatory system: Secondary | ICD-10-CM

## 2022-03-20 DIAGNOSIS — I2510 Atherosclerotic heart disease of native coronary artery without angina pectoris: Secondary | ICD-10-CM

## 2022-03-20 DIAGNOSIS — M7989 Other specified soft tissue disorders: Secondary | ICD-10-CM

## 2022-03-20 DIAGNOSIS — E785 Hyperlipidemia, unspecified: Secondary | ICD-10-CM | POA: Diagnosis not present

## 2022-03-20 MED ORDER — SILDENAFIL CITRATE 50 MG PO TABS: 50.0000 mg | ORAL_TABLET | Freq: Every day | ORAL | 11 refills | Status: DC | PRN

## 2022-03-20 NOTE — Patient Instructions (Signed)
Medication Instructions:  ?No changes ?*If you need a refill on your cardiac medications before your next appointment, please call your pharmacy* ? ? ?Lab Work: ?None ordered ?If you have labs (blood work) drawn today and your tests are completely normal, you will receive your results only by: ?MyChart Message (if you have MyChart) OR ?A paper copy in the mail ?If you have any lab test that is abnormal or we need to change your treatment, we will call you to review the results. ? ? ?Testing/Procedures: ?Your physician has requested that you have an echocardiogram in December. Echocardiography is a painless test that uses sound waves to create images of your heart. It provides your doctor with information about the size and shape of your heart and how well your heart?s chambers and valves are working. You may receive an ultrasound enhancing agent through an IV if needed to better visualize your heart during the echo.This procedure takes approximately one hour. There are no restrictions for this procedure. This will take place at the [address].  ? ? ? ?Follow-Up: ?At [facility], you and your health needs are our priority.  As part of our continuing mission to provide you with exceptional heart care, we have created designated Provider Care Teams.  These Care Teams include your primary Cardiologist (physician) and Advanced Practice Providers (APPs -  Physician Assistants and Nurse Practitioners) who all work together to provide you with the care you need, when you need it. ? ?We recommend signing up for the patient portal called "MyChart".  Sign up information is provided on this After Visit Summary.  MyChart is used to connect with patients for Virtual Visits (Telemedicine).  Patients are able to view lab/test results, encounter notes, upcoming appointments, etc.  Non-urgent messages can be sent to your provider as well.   ?To learn more about what you can do with MyChart, go to  [email or web address].   ? ?Your next appointment:   ?12 month(s) ? ?The format for your next appointment:   ?In Person ? ?Provider:   ?Minus Breeding, MD { ? ? ?

## 2022-04-25 ENCOUNTER — Telehealth: Payer: Self-pay | Admitting: Internal Medicine

## 2022-04-25 NOTE — Telephone Encounter (Signed)
Pt states the shot he received in December at his physical has been causing pain in his  ?

## 2022-04-28 ENCOUNTER — Encounter: Payer: Self-pay | Admitting: Internal Medicine

## 2022-04-28 ENCOUNTER — Telehealth (INDEPENDENT_AMBULATORY_CARE_PROVIDER_SITE_OTHER): Payer: Medicare HMO | Admitting: Internal Medicine

## 2022-04-28 DIAGNOSIS — M79622 Pain in left upper arm: Secondary | ICD-10-CM | POA: Diagnosis not present

## 2022-04-28 NOTE — Progress Notes (Signed)
?Virtual Visit via Video Note ? ?I connected with Jeremy Wolf on 04/28/22 at  8:45 AM EDT by a video enabled telemedicine application and verified that I am speaking with the correct person using two identifiers. ?Location patient:vehicle  ?Location provider:work \ office ?Persons participating in the virtual visit: patient, provider ? ?[place] national recommendations  regarding COVID 19 pandemic   video visit is advised over in office visit for this patient.  ?Patient aware  of the limitations of evaluation and management by telemedicine and  availability of in person appointments. and agreed to proceed. ? ? ?HPI: ?Jeremy Wolf presents for video visit because of ongoing left upper arm pain discomfort that he relates to high-dose flu vaccine given in that arm same area at the end of arc of December 2022. ?He states perhaps persistent discomfort was days after the shot although otherwise felt like it was a normal shot no redness or swelling but since that time he has had difficulty off-and-on with the upper arm.  Thinks it is getting a bit worse no specific injury otherwise ?Has had a recent cardiology evaluation and "everything was fine". ?Seems to be more of a problem when moving around using his upper extremity without shortness of breath sometimes does get heartburn but not initially associated with the left arm symptoms ?No numbness or weakness obvious. ? ?He is a right-handed. ?ROS: See pertinent positives and negatives per HPI. ? ?Past Medical History:  ?Diagnosis Date  ? Allergic rhinitis   ? Allergy   ? Atrial fibrillation ([place]) 02/10/2009  ? Centricity Description: FIBRILLATION, ATRIAL Qualifier: Diagnosis of  By: Percival Spanish, MD, Farrel Gordon   Centricity Description: ATRIAL FIBRILLATION Qualifier: Diagnosis of  By: Sarajane Jews MD, Ishmael Holter   ? Blood transfusion without reported diagnosis 2010  ? after heart surgery  ? CAD (coronary artery disease)   ? a. cath 12/09: oD1 50%, EF 65%;  b. negative ETT 7/11  ?  Cataract   ? removed left eye with lens implant   ? Constipation   ? uses Miralax  ? History of atrial fibrillation   ? post op AFib  ? Hx of colonic polyps   ? Hyperlipidemia   ? Mitral valve prolapse   ? a. with severe regurgitation; s/p MV repair 2/10;  b. 07/2011 Echo: EF 50-55%, mild MR.  ? Nephrolithiasis   ? hx of  ? Patent foramen ovale   ? a. s/p closure at MV repair in 2010  ? Plantar fasciitis of left foot 07/06/2012  ? Premature birth ?   ? twin  was in hosp 30 days feeding lazy eye small weight   ? Rheumatic fever with heart involvement   ? MVP per pt due to this with repair 2010  ? Sleep apnea   ? no cpap  ? ? ?Past Surgical History:  ?Procedure Laterality Date  ? COLONOSCOPY    ? HERNIA REPAIR Right 2012  ? Inguinal 3 years ago  ? PATENT FORAMEN OVALE CLOSURE  2010  ? POLYPECTOMY    ? SPINE SURGERY  12/09/2010  ? vertebra 5 & 6  ? THORACOTOMY  2010  ? Rt miniature for mitral valve repair, quadrangular resection of posterior leaftlet with transposition of  native chordae tendineae x 2 and 89m Edwards Physio II ring annuloplasty  ? ? ?Family History  ?Problem Relation Age of Onset  ? Diabetes Mother   ? Hyperlipidemia Mother   ? Hyperlipidemia Father   ? Heart disease Father   ?  heart surgery 84 ? cabd and valve  ? Cancer Daughter 70  ?     ovarian  ? Lung cancer Maternal Grandfather   ?     lung  ? Emphysema Maternal Grandfather   ? Arthritis Paternal Grandmother   ? Arthritis Paternal Grandfather   ? Rheumatic fever Unknown   ?     2 sisters  ? Other Sister   ?     twin hx of substance use  ? Colon cancer Neg Hx   ? Colon polyps Neg Hx   ? Esophageal cancer Neg Hx   ? Rectal cancer Neg Hx   ? Stomach cancer Neg Hx   ? ? ?Social History  ? ?Tobacco Use  ? Smoking status: Never  ? Smokeless tobacco: Never  ?Vaping Use  ? Vaping Use: Never used  ?Substance Use Topics  ? Alcohol use: Yes  ?  Alcohol/week: 0.0 standard drinks  ?  Comment: OCC. WINE  ? Drug use: No  ? ? ? ? ?Current Outpatient  Medications:  ?  aspirin EC 81 MG tablet, Take 81 mg by mouth daily., Disp: , Rfl:  ?  Misc Natural Products (RED WINE EXTRACT) CAPS, Take by mouth., Disp: , Rfl:  ?  rosuvastatin (CRESTOR) 20 MG tablet, Take 1 tablet (20 mg total) by mouth daily., Disp: 90 tablet, Rfl: 0 ?  sildenafil (VIAGRA) 50 MG tablet, Take 1 tablet (50 mg total) by mouth daily as needed for erectile dysfunction., Disp: 10 tablet, Rfl: 11 ? ?EXAM: ?BP Readings from Last 3 Encounters:  ?03/20/22 120/82  ?12/18/21 116/80  ?11/21/21 132/82  ? ? ?VITALS per patient if applicable: ? ?GENERAL: alert, oriented, appears well and in no acute distress ? ?HEENT: atraumatic, conjunttiva clear, no obvious abnormalities on inspection of external nose and ears ? ?NECK: normal movements of the head and neck ? ?LUNGS: on inspection no signs of respiratory distress, breathing rate appears normal, no obvious gross SOB, gasping or wheezing ? ?CV: no obvious cyanosis ? ?MS: moves all visible extremities without noticeable abnormality no obvious deformity over video ? ?PSYCH/NEURO: pleasant and cooperative, no obvious depression or anxiety, speech and thought processing grossly intact ?Lab Results  ?Component Value Date  ? WBC 6.1 12/18/2021  ? HGB 14.3 12/18/2021  ? HCT 42.9 12/18/2021  ? PLT 210.0 12/18/2021  ? GLUCOSE 96 12/18/2021  ? CHOL 182 12/18/2021  ? TRIG 107.0 12/18/2021  ? HDL 54.00 12/18/2021  ? LDLDIRECT 154.7 07/12/2013  ? LDLCALC 107 (H) 12/18/2021  ? ALT 23 12/18/2021  ? AST 20 12/18/2021  ? NA 140 12/18/2021  ? K 5.0 12/18/2021  ? CL 103 12/18/2021  ? CREATININE 0.94 12/18/2021  ? BUN 16 12/18/2021  ? CO2 27 12/18/2021  ? TSH 2.18 12/18/2021  ? PSA 1.46 12/18/2021  ? INR 2.0 05/23/2009  ? HGBA1C 5.5 10/19/2018  ? ? ?ASSESSMENT AND PLAN: ? ?Discussed the following assessment and plan: ? ?  ICD-10-CM   ?1. Left upper arm pain  M79.622 Ambulatory referral to Sports Medicine  ? atttributes to after high dose flu vaccine 12 28 22,  no assoc sx had  recent cards  check.  ?  ? ?Uncertain problem because however timely related to high-dose flu vaccine without localized symptomatology. ?He is concerns and feels getting worse .  ?At this time we will get opinion from sports medicine with option of ultrasound diagnostically to help define cause of problem and prognosis intervention.  It may be  a mechanical musculoskeletal problem. ? ?Does get occasional chest pain and heartburn but not related to arm pain and is recently seen cardiology but uncertain if they addressed the symptoms. ?Counseled.  ? Expectant management and discussion of plan and treatment with opportunity to ask questions and all were answered. The patient agreed with the plan and demonstrated an understanding of the instructions. ?  ?Advised to call back or seek an in-person evaluation if worsening  or having  further concerns  in interim. ?Return if symptoms worsen in the interim. ? ?Shanon Ace, MD  ?

## 2022-04-29 NOTE — Progress Notes (Signed)
? ?  I, Jeremy Wolf, LAT, ATC acting as a scribe for Jeremy Leader, MD. ? ?Subjective:   ? ?CC: L shoulder/upper arm pain ? ?HPI: Pt is a 66 y/o male c/o L upper arm pain since Dec 2022 after getting his flu vaccine. Pt notes pain is intermittent. He locates his pain to over the lateral/middle deltoid w/ radiating pain intermittently to his elbow.  ? ?He has a history of CAD and is a little worried this could represent heart related pain.  He does a little bit of chest pain associated with this.  His pain is not exertional in nature.  No palpitations. ? ?Neck pain: hx of cervical fusion (Dr. [name]) ?Radiating pain: yes ?Numbness/tingling: yes- L trapz ?Aggravating factors: certain motions ?Treatments tried: none ? ?Pertinent review of Systems: No fevers or chills or palpitations lightheadedness or dizziness. ? ?Relevant historical information: Cervical fusion as above.  CAD. ? ? ?Objective:   ? ?Vitals:  ? 05/01/22 0748  ?BP: (!) 152/100  ?Pulse: 71  ?SpO2: 97%  ? ?General: Well Developed, well nourished, and in no acute distress.  ? ?MSK: C-spine: Nontender midline.  Decreased cervical motion.  Negative Spurling's test. ?Extremity strength and sensation are intact. ? ?Shoulder: Normal appearing ?Nontender. ?Normal shoulder motion. ?Intact strength. ?Negative Hawkins and Neer's test. ?Negative Yergason's and speeds test. ? ?Lab and Radiology Results ? ?Diagnostic Limited MSK Ultrasound of: Left shoulder ?Biceps tendon intact normal. ?Subscapularis tendon normal. ?Supraspinatus tendon normal. ?Subacromial bursa normal-appearing ?Infraspinatus tendon normal. ?AC joint mild effusion ?Deltoid area of pain normal.  Ultrasound examination.  Nontender to ultrasound palpation. ?Impression: Mild AC effusion otherwise normal rotator cuff ultrasound examination ? ? ? ? ?Impression and Recommendations:   ? ?Assessment and Plan: ?66 y.o. male with left upper arm pain after influenza vaccine December 2022.  Etiology is  unclear at this time.  His pain is not typical for either rotator cuff related pain or cervical radicular pain.  He is most concerned that his pain could represent heart etiology which is a possibility.  I think it is reasonable to do home exercise program for rotator cuff impingement or tendinopathy today.  Exercises taught in clinic today by ATC.  Additionally has a follow-up appointment scheduled with his neurosurgeon which is reasonable.  Assuming home exercises do not work and neurosurgery does not think that his pain is related to a pinched nerve in his neck (I do not think it is either) next step should probably be a chest or shoulder x-ray and reassessment with cardiology for stress test or other risk factor stratification to ensure less likely to be cardiac etiology.  He will keep me updated.. ? ?PDMP not reviewed this encounter. ?Orders Placed This Encounter  ?Procedures  ? [place] LIMITED JOINT SPACE STRUCTURES UP LEFT(NO LINKED CHARGES)  ?  Order Specific Question:   Reason for Exam (SYMPTOM  OR DIAGNOSIS REQUIRED)  ?  Answer:   L shoulder pain  ?  Order Specific Question:   Preferred imaging location?  ?  Answer:   [facility]  ? ?No orders of the defined types were placed in this encounter. ? ? ?Discussed warning signs or symptoms. Please see discharge instructions. Patient expresses understanding. ? ? ?The above documentation has been reviewed and is accurate and complete Jeremy Wolf, M.D. ? ?

## 2022-05-01 ENCOUNTER — Ambulatory Visit (INDEPENDENT_AMBULATORY_CARE_PROVIDER_SITE_OTHER): Payer: Medicare HMO | Admitting: Family Medicine

## 2022-05-01 ENCOUNTER — Ambulatory Visit: Payer: Self-pay

## 2022-05-01 VITALS — BP 152/100 | HR 71 | Ht 69.0 in | Wt 206.8 lb

## 2022-05-01 DIAGNOSIS — M25512 Pain in left shoulder: Secondary | ICD-10-CM

## 2022-05-01 DIAGNOSIS — G8929 Other chronic pain: Secondary | ICD-10-CM

## 2022-05-01 NOTE — Patient Instructions (Addendum)
Thank you for coming in today.  ? ?Please complete the exercises that the athletic trainer went over with you:  View at [email or web address]-exercise-[email or web address] using code: [id number] ? ?Follow up with your neurologist and primary care. ?

## 2022-05-09 DIAGNOSIS — M542 Cervicalgia: Secondary | ICD-10-CM | POA: Diagnosis not present

## 2022-05-26 ENCOUNTER — Other Ambulatory Visit: Payer: Self-pay | Admitting: Cardiology

## 2022-06-07 DIAGNOSIS — Z7982 Long term (current) use of aspirin: Secondary | ICD-10-CM | POA: Diagnosis not present

## 2022-06-07 DIAGNOSIS — Z833 Family history of diabetes mellitus: Secondary | ICD-10-CM | POA: Diagnosis not present

## 2022-06-07 DIAGNOSIS — E785 Hyperlipidemia, unspecified: Secondary | ICD-10-CM | POA: Diagnosis not present

## 2022-08-21 ENCOUNTER — Other Ambulatory Visit: Payer: Self-pay | Admitting: Cardiology

## 2022-08-29 ENCOUNTER — Ambulatory Visit (INDEPENDENT_AMBULATORY_CARE_PROVIDER_SITE_OTHER): Payer: Medicare HMO | Admitting: Adult Health

## 2022-08-29 ENCOUNTER — Encounter: Payer: Self-pay | Admitting: Adult Health

## 2022-08-29 VITALS — BP 110/80 | HR 28 | Temp 98.6°F | Ht 69.0 in | Wt 203.0 lb

## 2022-08-29 DIAGNOSIS — J22 Unspecified acute lower respiratory infection: Secondary | ICD-10-CM | POA: Diagnosis not present

## 2022-08-29 MED ORDER — PREDNISONE 10 MG PO TABS: ORAL_TABLET | ORAL | 0 refills | Status: DC

## 2022-08-29 MED ORDER — DOXYCYCLINE HYCLATE 100 MG PO CAPS: 100.0000 mg | ORAL_CAPSULE | Freq: Two times a day (BID) | ORAL | 0 refills | Status: DC

## 2022-08-29 NOTE — Patient Instructions (Signed)
It was great seeing you today   I have sent in an antibiotics called Doxycycline and a steroid to help with your symptoms   Let us know if you are not any better in the next 2-3 days

## 2022-08-29 NOTE — Progress Notes (Signed)
Subjective:    Patient ID: Jeremy Wolf, male    DOB: [birth year], 66 y.o.   MRN: [id number]  HPI 66 year old male who  has a past medical history of Allergic rhinitis, Allergy, Atrial fibrillation ([place]) (02/10/2009), Blood transfusion without reported diagnosis (2010), CAD (coronary artery disease), Cataract, Constipation, History of atrial fibrillation, colonic polyps, Hyperlipidemia, Mitral valve prolapse, Nephrolithiasis, Patent foramen ovale, Plantar fasciitis of left foot (07/06/2012), Premature birth (? ), Rheumatic fever with heart involvement, and Sleep apnea.  He presents to the office today for two weeks of productive cough that seems to be getting worse. Sputum is white to yellow. He has had wheezing but no shortness of breath.   He denies fevers or chills.   No smoking history   Review of Systems See HPI   Past Medical History:  Diagnosis Date   Allergic rhinitis    Allergy    Atrial fibrillation ([place]) 02/10/2009   Centricity Description: FIBRILLATION, ATRIAL Qualifier: Diagnosis of  By: Percival Spanish, MD, Farrel Gordon   Centricity Description: ATRIAL FIBRILLATION Qualifier: Diagnosis of  By: Sarajane Jews MD, Ishmael Holter    Blood transfusion without reported diagnosis 2010   after heart surgery   CAD (coronary artery disease)    a. cath 12/09: oD1 50%, EF 65%;  b. negative ETT 7/11   Cataract    removed left eye with lens implant    Constipation    uses Miralax   History of atrial fibrillation    post op AFib   Hx of colonic polyps    Hyperlipidemia    Mitral valve prolapse    a. with severe regurgitation; s/p MV repair 2/10;  b. 07/2011 Echo: EF 50-55%, mild MR.   Nephrolithiasis    hx of   Patent foramen ovale    a. s/p closure at MV repair in 2010   Plantar fasciitis of left foot 07/06/2012   Premature birth ?    twin  was in hosp 30 days feeding lazy eye small weight    Rheumatic fever with heart involvement    MVP per pt due to this with repair 2010   Sleep apnea    no  cpap    Social History   Socioeconomic History   Marital status: Married    Spouse name: Not on file   Number of children: 5   Years of education: Not on file   Highest education level: Not on file  Occupational History   Occupation: Truck Geophysicist/field seismologist  Tobacco Use   Smoking status: Never   Smokeless tobacco: Never  Vaping Use   Vaping Use: Never used  Substance and Sexual Activity   Alcohol use: Yes    Alcohol/week: 0.0 standard drinks of alcohol    Comment: OCC. 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    Sleeps; 7- 8 hours   Married (second marriage) 4 children   Regular exercise- no    Married summer 2007   caffeine 10-12 cups coffee   HH of 2   No pets   Originally from the [employer]   Was a twin birth midly  Premature  Fraternal  Twin sister    Social Determinants of Health   Financial Resource Strain: Not  on file  Food Insecurity: Not on file  Transportation Needs: Not on file  Physical Activity: Not on file  Stress: Not on file  Social Connections: Not on file  Intimate Partner Violence: Not on file    Past Surgical History:  Procedure Laterality Date   COLONOSCOPY     HERNIA REPAIR Right 2012   Inguinal 3 years ago   [place]  12/09/2010   vertebra 5 & 6   THORACOTOMY  2010   Rt miniature for mitral valve repair, quadrangular resection of posterior leaftlet with transposition of  native chordae tendineae x 2 and 26m Edwards Physio II ring annuloplasty    Family History  Problem Relation Age of Onset   Diabetes Mother    Hyperlipidemia Mother    Hyperlipidemia Father    Heart disease Father        heart surgery 814? cabd and valve   Cancer Daughter 228      ovarian   Lung cancer Maternal  Grandfather        lung   Emphysema Maternal Grandfather    Arthritis Paternal Grandmother    Arthritis Paternal Grandfather    Rheumatic fever Unknown        2 sisters   Other Sister        twin hx of substance use   Colon cancer Neg Hx    Colon polyps Neg Hx    Esophageal cancer Neg Hx    Rectal cancer Neg Hx    Stomach cancer Neg Hx     No Known Allergies  Current Outpatient Medications on File Prior to Visit  Medication Sig Dispense Refill   aspirin EC 81 MG tablet Take 81 mg by mouth daily.     Misc Natural Products (RED WINE EXTRACT) CAPS Take by mouth.     rosuvastatin (CRESTOR) 20 MG tablet TAKE 1 TABLET BY MOUTH EVERY DAY 90 tablet 3   sildenafil (VIAGRA) 50 MG tablet Take 1 tablet (50 mg total) by mouth daily as needed for erectile dysfunction. 10 tablet 11   No current facility-administered medications on file prior to visit.    BP 110/80   Pulse (!) 28   Temp 98.6 F (37 C) (Oral)   Ht '5\' 9"'$  (1.753 m)   Wt 203 lb (92.1 kg)   SpO2 98%   BMI 29.98 kg/m       Objective:   Physical Exam Vitals and nursing note reviewed.  Constitutional:      Appearance: Normal appearance.  Cardiovascular:     Rate and Rhythm: Normal rate and regular rhythm.     Pulses: Normal pulses.     Heart sounds: Normal heart sounds.  Pulmonary:     Effort: Pulmonary effort is normal.     Breath sounds: No stridor. Wheezing present. No rhonchi or rales.  Abdominal:     General: Abdomen is flat. Bowel sounds are normal.     Palpations: Abdomen is soft.  Skin:    General: Skin is warm and dry.  Neurological:     General: No focal deficit present.     Mental Status: He is alert and oriented to person, place, and time.  Psychiatric:        Mood and Affect: Mood normal.        Behavior: Behavior normal.        Thought Content: Thought content normal.  Judgment: Judgment normal.        Assessment & Plan:  1. Lower respiratory infection - Bronchitis vs pneumonia. Due  to symptoms and duration will cover for both - Advised follow up if not improving in the next 2-3 days - doxycycline (VIBRAMYCIN) 100 MG capsule; Take 1 capsule (100 mg total) by mouth 2 (two) times daily.  Dispense: 14 capsule; Refill: 0 - predniSONE (DELTASONE) 10 MG tablet; 40 mg x 3 days, 20 mg x 3 days, 10 mg x 3 days  Dispense: 21 tablet; Refill: 0  Dorothyann Peng, NP

## 2022-09-29 ENCOUNTER — Ambulatory Visit (HOSPITAL_COMMUNITY): Payer: Medicare HMO | Attending: Cardiology

## 2022-09-29 DIAGNOSIS — I4891 Unspecified atrial fibrillation: Secondary | ICD-10-CM | POA: Diagnosis not present

## 2022-09-29 DIAGNOSIS — Z9889 Other specified postprocedural states: Secondary | ICD-10-CM | POA: Insufficient documentation

## 2022-09-29 DIAGNOSIS — I2510 Atherosclerotic heart disease of native coronary artery without angina pectoris: Secondary | ICD-10-CM | POA: Diagnosis not present

## 2022-09-29 DIAGNOSIS — I517 Cardiomegaly: Secondary | ICD-10-CM | POA: Insufficient documentation

## 2022-09-29 DIAGNOSIS — E785 Hyperlipidemia, unspecified: Secondary | ICD-10-CM | POA: Insufficient documentation

## 2022-09-29 LAB — ECHOCARDIOGRAM COMPLETE
Area-P 1/2: 4.49 cm2
MV VTI: 2.41 cm2
S' Lateral: 3.2 cm

## 2022-10-14 ENCOUNTER — Telehealth: Payer: Self-pay | Admitting: Internal Medicine

## 2022-10-14 NOTE — Telephone Encounter (Signed)
Pt seen cory on 08-29-2022 for bronchitis and pt is calling and dentist office dr Paulo Fruit m jones would like a letter stating the pt is clear to have dental cleaning phone number is [phone] and fax number is [phone]

## 2022-10-15 NOTE — Telephone Encounter (Signed)
Doesn't make sense  Clarify why needed?  (He also sees cardiology  for hx of heart valve problem ) Is he not better?

## 2022-10-16 NOTE — Telephone Encounter (Signed)
Spoke to patient. He updates he is doing better since seen for bronchitis on 08/29/2022. He reports the Dentist Office is requesting PCP to write a clearance letter for him to have his teeth clean. His appointment date was on 10/27/2022 but will have to change due to work schedule.   Please advise.

## 2022-10-17 NOTE — Telephone Encounter (Signed)
If he has no fever and coughing is better  , should be ok  that he can get dental cleaning . If he is still coughing too much then he needs a visit  for evalutaion. Please compose a letter that states he is beter from respiratory infection and he can get dental cleaning .  If not better he needs a in person visit

## 2022-10-20 NOTE — Telephone Encounter (Signed)
Letter was composed and faxed to number below.

## 2022-10-20 NOTE — Telephone Encounter (Signed)
Pt called to say the dentist is still waiting for the letter to be faxed  Attn: Dreama  fax number is [phone]

## 2022-10-23 DIAGNOSIS — Z0100 Encounter for examination of eyes and vision without abnormal findings: Secondary | ICD-10-CM | POA: Diagnosis not present

## 2022-10-23 DIAGNOSIS — H524 Presbyopia: Secondary | ICD-10-CM | POA: Diagnosis not present

## 2022-12-16 ENCOUNTER — Ambulatory Visit (INDEPENDENT_AMBULATORY_CARE_PROVIDER_SITE_OTHER): Payer: Medicare HMO

## 2022-12-16 VITALS — Ht 69.0 in | Wt 203.0 lb

## 2022-12-16 DIAGNOSIS — Z0000 Encounter for general adult medical examination without abnormal findings: Secondary | ICD-10-CM

## 2022-12-16 NOTE — Progress Notes (Signed)
Subjective:   Jeremy Wolf is a 66 y.o. male who presents for Medicare Annual/Subsequent preventive examination.  Review of Systems    Virtual Visit via Telephone Note  I connected with  Jeremy Wolf on 12/16/22 at  3:30 PM EST by telephone and verified that I am speaking with the correct person using two identifiers.  Location: Patient: Home Provider: Office Persons participating in the virtual visit: patient/Nurse Health Advisor   I discussed the limitations, risks, security and privacy concerns of performing an evaluation and management service by telephone and the availability of in person appointments. The patient expressed understanding and agreed to proceed.  Interactive audio and video telecommunications were attempted between this nurse and patient, however failed, due to patient having technical difficulties OR patient did not have access to video capability.  We continued and completed visit with audio only.  Some vital signs may be absent or patient reported.   Jeremy Peaches, LPN  Cardiac Risk Factors include: advanced age (>41mn, >>36women);male gender     Objective:    Today's Vitals   12/16/22 1546  Weight: 203 lb (92.1 kg)  Height: '5\' 9"'$  (1.753 m)   Body mass index is 29.98 kg/m.     12/16/2022    3:57 PM 09/28/2017    9:16 AM 10/23/2014    8:28 AM  Advanced Directives  Does Patient Have a Medical Advance Directive? Yes No No  Type of Jeremy Wolf    Copy of [place] Chart? No - copy requested    Would patient like information on creating a medical advance directive?  No - Patient declined     Current Medications (verified) Outpatient Encounter Medications as of 12/16/2022  Medication Sig   aspirin EC 81 MG tablet Take 81 mg by mouth daily.   doxycycline (VIBRAMYCIN) 100 MG capsule Take 1 capsule (100 mg total) by mouth 2 (two) times daily.   Misc Natural Products (RED WINE  EXTRACT) CAPS Take by mouth.   predniSONE (DELTASONE) 10 MG tablet 40 mg x 3 days, 20 mg x 3 days, 10 mg x 3 days   rosuvastatin (CRESTOR) 20 MG tablet TAKE 1 TABLET BY MOUTH EVERY DAY   sildenafil (VIAGRA) 50 MG tablet Take 1 tablet (50 mg total) by mouth daily as needed for erectile dysfunction.   No facility-administered encounter medications on file as of 12/16/2022.    Allergies (verified) Patient has no known allergies.   History: Past Medical History:  Diagnosis Date   Allergic rhinitis    Allergy    Atrial fibrillation ([place] 02/10/2009   Centricity Description: FIBRILLATION, ATRIAL Qualifier: Diagnosis of  By: HPercival Spanish MD, FFarrel Gordon  Centricity Description: ATRIAL FIBRILLATION Qualifier: Diagnosis of  By: FSarajane JewsMD, SIshmael Holter   Blood transfusion without reported diagnosis 2010   after heart surgery   CAD (coronary artery disease)    a. cath 12/09: oD1 50%, EF 65%;  b. negative ETT 7/11   Cataract    removed left eye with lens implant    Constipation    uses Miralax   History of atrial fibrillation    post op AFib   Hx of colonic polyps    Hyperlipidemia    Mitral valve prolapse    a. with severe regurgitation; s/p MV repair 2/10;  b. 07/2011 Echo: EF 50-55%, mild MR.   Nephrolithiasis    hx of   Patent foramen ovale  a. s/p closure at MV repair in 2010   Plantar fasciitis of left foot 07/06/2012   Premature birth ?    twin  was in hosp 30 days feeding lazy eye small weight    Rheumatic fever with heart involvement    MVP per pt due to this with repair 2010   Sleep apnea    no cpap   Past Surgical History:  Procedure Laterality Date   COLONOSCOPY     HERNIA REPAIR Right 2012   Inguinal 3 years ago   [place]  2010   POLYPECTOMY     SPINE SURGERY  12/09/2010   vertebra 5 & 6   THORACOTOMY  2010   Rt miniature for mitral valve repair, quadrangular resection of posterior leaftlet with transposition of  native chordae tendineae x 2 and  79m Edwards Physio II ring annuloplasty   Family History  Problem Relation Age of Onset   Diabetes Mother    Hyperlipidemia Mother    Hyperlipidemia Father    Heart disease Father        heart surgery 83? cabd and valve   Cancer Daughter 234      ovarian   Lung cancer Maternal Grandfather        lung   Emphysema Maternal Grandfather    Arthritis Paternal Grandmother    Arthritis Paternal Grandfather    Rheumatic fever Unknown        2 sisters   Other Sister        twin hx of substance use   Colon cancer Neg Hx    Colon polyps Neg Hx    Esophageal cancer Neg Hx    Rectal cancer Neg Hx    Stomach cancer Neg Hx    Social History   Socioeconomic History   Marital status: Married    Spouse name: Not on file   Number of children: 5   Years of education: Not on file   Highest education level: Not on file  Occupational History   Occupation: Truck DGeophysicist/field seismologist Tobacco Use   Smoking status: Never   Smokeless tobacco: Never  Vaping Use   Vaping Use: Never used  Substance and Sexual Activity   Alcohol use: Yes    Alcohol/week: 0.0 standard drinks of alcohol    Comment: OCC. 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    Sleeps; 7- 8 hours   Married (second marriage) 4 children   Regular exercise- no    Married summer 2007   caffeine 10-12 cups coffee   HH of 2   No pets   [place]  Was a [place] birth [place]  Premature  Fraternal  Twin sister    Social Determinants of Health   Financial Resource Strain: Low Risk  (12/16/2022)   Overall Financial Resource Strain (CARDIA)    Difficulty of Paying Living Expenses: Not hard at all  Food Insecurity: No Food Insecurity (12/16/2022)   Hunger Vital Sign    Worried About Running Out of Food in the Last Year: Never true     Ran Out of Food in the Last Year: Never true  Transportation Needs: No Transportation Needs (12/16/2022)   PRAPARE -  Hydrologist (Medical): No    Lack of Transportation (Non-Medical): No  Physical Activity: Inactive (12/16/2022)   Exercise Vital Sign    Days of Exercise per Week: 0 days    Minutes of Exercise per Session: 0 min  Stress: No Stress Concern Present (12/16/2022)   [place]    Feeling of Stress : Not at all  Social Connections: [place] (12/16/2022)   Social Connection and Isolation Panel [NHANES]    Frequency of Communication with Friends and Family: More than three times a week    Frequency of Social Gatherings with Friends and Family: More than three times a week    Attends Religious Services: More than 4 times per year    Active Member of [employer] or Organizations: Yes    Attends Music therapist: More than 4 times per year    Marital Status: Married    Tobacco Counseling Counseling given: Not Answered   Clinical Intake:  Pre-visit preparation completed: No  Pain : No/denies pain     BMI - recorded: 29.98 Nutritional Status: BMI 25 -29 Overweight Nutritional Risks: None Diabetes: No  How often do you need to have someone help you when you read instructions, pamphlets, or other written materials from your doctor or pharmacy?: 3 - Sometimes  Diabetic?  No  Interpreter Needed?: No  Information entered by :: Rolene Arbour LPN   Activities of Daily Living    12/16/2022    3:55 PM  In your present state of health, do you have any difficulty performing the following activities:  Hearing? 0  Vision? 0  Difficulty concentrating or making decisions? 0  Walking or climbing stairs? 0  Dressing or bathing? 0  Doing errands, shopping? 0  Preparing Food and eating ? N  Using the Toilet? N  In the past six months, have you accidently  leaked urine? N  Do you have problems with loss of bowel control? N  Managing your Medications? N  Managing your Finances? N  Housekeeping or managing your Housekeeping? N    Patient Care Team: Panosh, Standley Brooking, MD as PCP - General Minus Breeding, MD as PCP - Cardiology (Cardiology) Minus Breeding, MD (Cardiology) Newman Pies, MD (Neurosurgery) Loletha Carrow Kirke Corin, MD as Consulting Physician (Gastroenterology) Danella Sensing, MD as Consulting Physician (Dermatology)  Indicate any recent Medical Services you may have received from other than [facility] providers in the past year (date may be approximate).     Assessment:   This is a routine wellness examination for Jeremy Wolf.  Hearing/Vision screen Hearing Screening - Comments:: Denies hearing difficulties   Vision Screening - Comments:: Wears rx glasses - up to date with routine eye exams with  Dr Gilford Rile  Dietary issues and exercise activities discussed: Current Exercise Habits: The patient does not participate in regular exercise at present, Exercise limited by: None identified   Goals Addressed               This Visit's Progress     Several Goals (pt-stated)        One goal is to win the lottery.       Depression Screen    12/16/2022    3:53 PM 12/18/2021    9:45 AM 12/17/2020    8:38 AM 11/16/2019    1:44 PM 10/19/2018   10:19 AM 09/28/2017    9:15 AM  PHQ 2/9 Scores  PHQ - 2 Score  0 0 1 0 1 0  PHQ- 9 Score  2        Fall Risk    12/16/2022    3:55 PM 12/18/2021    9:44 AM 12/17/2020    8:38 AM 09/28/2017    9:15 AM  Fall Risk   Falls in the past year? 0 0 0 No  Number falls in past yr: 0     Injury with Fall? 0  0   Risk for fall due to : No Fall Risks     Follow up Falls prevention discussed  Falls evaluation completed     [place]:  Any stairs in or around the home? Yes  If so, are there any without handrails? No  Home free of loose throw rugs in walkways, pet  beds, electrical cords, etc? Yes  Adequate lighting in your home to reduce risk of falls? Yes   ASSISTIVE DEVICES UTILIZED TO PREVENT FALLS:  Life alert? No  Use of a cane, walker or w/c? No  Grab bars in the bathroom? No  Shower chair or bench in shower? No  Elevated toilet seat or a handicapped toilet? No   TIMED UP AND GO:  Was the test performed? No . Audio Visit  Cognitive Function:        12/16/2022    3:57 PM  6CIT Screen  What Year? 0 points  What month? 0 points  What time? 0 points  Count back from 20 0 points  Months in reverse 2 points  Repeat phrase 0 points  Total Score 2 points    Immunizations Immunization History  Administered Date(s) Administered   Fluad Quad(high Dose 65+) 12/18/2021   Influenza Whole 10/12/2009   Influenza,inj,Quad PF,6+ Mos 11/08/2013, 09/24/2016, 09/28/2017, 10/19/2018, 11/16/2019, 12/17/2020   Td 12/22/2002   Tdap 07/19/2013    TDAP status: Up to date  Flu Vaccine status: Up to date  Pneumococcal vaccine status: Due, Education has been provided regarding the importance of this vaccine. Advised may receive this vaccine at local pharmacy or Health Dept. Aware to provide a copy of the vaccination record if obtained from local pharmacy or Health Dept. Verbalized acceptance and understanding.  Covid-19 vaccine status: Declined, Education has been provided regarding the importance of this vaccine but patient still declined. Advised may receive this vaccine at local pharmacy or Health Dept.or vaccine clinic. Aware to provide a copy of the vaccination record if obtained from local pharmacy or Health Dept. Verbalized acceptance and understanding.  Qualifies for Shingles Vaccine? Yes   Zostavax completed No   Shingrix Completed?: No.    Education has been provided regarding the importance of this vaccine. Patient has been advised to call insurance company to determine out of pocket expense if they have not yet received this vaccine.  Advised may also receive vaccine at local pharmacy or Health Dept. Verbalized acceptance and understanding.  Screening Tests Health Maintenance  Topic Date Due   COVID-19 Vaccine (1) 01/01/2023 (Originally 12/04/1961)   Zoster Vaccines- Shingrix (1 of 2) 03/17/2023 (Originally 12/05/1975)   INFLUENZA VACCINE  03/22/2023 (Originally 07/22/2022)   Pneumonia Vaccine 24+ Years old (1 - PCV) 12/17/2023 (Originally 12/04/2021)   DTaP/Tdap/Td (3 - Td or Tdap) 07/20/2023   COLONOSCOPY (Pts 45-88yr Insurance coverage Wolf need to be confirmed)  10/16/2023   Medicare Annual Wellness (AWV)  12/17/2023   Hepatitis C Screening  Completed   HPV VACCINES  Aged Out    Health Maintenance  There are no preventive care reminders to display for this patient.   Colorectal cancer screening: Type of screening: Colonoscopy. Completed 10/15/18. Repeat every 5 years  Lung Cancer Screening: (Low Dose CT Chest recommended if Age 74-80 years, 30 pack-year currently smoking OR have quit w/in 15years.) does not qualify.    Additional Screening:  Hepatitis C Screening: does qualify; Completed 09/17/16  Vision Screening: Recommended annual ophthalmology exams for early detection of glaucoma and other disorders of the eye. Is the patient up to date with their annual eye exam?  Yes  Who is the provider or what is the name of the office in which the patient attends annual eye exams? Dr Gilford Rile If pt is not established with a provider, would they like to be referred to a provider to establish care? No .   Dental Screening: Recommended annual dental exams for proper oral hygiene  Community Resource Referral / Chronic Care Management:  CRR required this visit?  No   CCM required this visit?  No      Plan:     I have personally reviewed and noted the following in the patient's chart:   Medical and social history Use of alcohol, tobacco or illicit drugs  Current medications and supplements including opioid  prescriptions. Patient is not currently taking opioid prescriptions. Functional ability and status Nutritional status Physical activity Advanced directives List of other physicians Hospitalizations, surgeries, and ER visits in previous 12 months Vitals Screenings to include cognitive, depression, and falls Referrals and appointments  In addition, I have reviewed and discussed with patient certain preventive protocols, quality metrics, and best practice recommendations. A written personalized care plan for preventive services as well as general preventive health recommendations were provided to patient.     Jeremy Peaches, LPN   01/75/1025   Nurse Notes: None

## 2022-12-16 NOTE — Patient Instructions (Addendum)
Jeremy Wolf , Thank you for taking time to come for your Medicare Wellness Visit. I appreciate your ongoing commitment to your health goals. Please review the following plan we discussed and let me know if I can assist you in the future.   These are the goals we discussed:  Goals       Several Goals (pt-stated)      One goal is to win the lottery.        This is a list of the screening recommended for you and due dates:  Health Maintenance  Topic Date Due   COVID-19 Vaccine (1) 01/01/2023*   Zoster (Shingles) Vaccine (1 of 2) 03/17/2023*   Flu Shot  03/22/2023*   Pneumonia Vaccine (1 - PCV) 12/17/2023*   DTaP/Tdap/Td vaccine (3 - Td or Tdap) 07/20/2023   Colon Cancer Screening  10/16/2023   Medicare Annual Wellness Visit  12/17/2023   Hepatitis C Screening: USPSTF Recommendation to screen - Ages 18-79 yo.  Completed   HPV Vaccine  Aged Out  *Topic was postponed. The date shown is not the original due date.    Advanced directives: Please bring a copy of your health care power of attorney and living will to the office to be added to your chart at your convenience.   Conditions/risks identified: None  Next appointment: Follow up in one year for your annual wellness visit.   Preventive Care 32 Years and Older, Male  Preventive care refers to lifestyle choices and visits with your health care provider that can promote health and wellness. What does preventive care include? A yearly physical exam. This is also called an annual well check. Dental exams once or twice a year. Routine eye exams. Ask your health care provider how often you should have your eyes checked. Personal lifestyle choices, including: Daily care of your teeth and gums. Regular physical activity. Eating a healthy diet. Avoiding tobacco and drug use. Limiting alcohol use. Practicing safe sex. Taking low doses of aspirin every day. Taking vitamin and mineral supplements as recommended by your health care  provider. What happens during an annual well check? The services and screenings done by your health care provider during your annual well check will depend on your age, overall health, lifestyle risk factors, and family history of disease. Counseling  Your health care provider may ask you questions about your: Alcohol use. Tobacco use. Drug use. Emotional well-being. Home and relationship well-being. Sexual activity. Eating habits. History of falls. Memory and ability to understand (cognition). Work and work Statistician. Screening  You may have the following tests or measurements: Height, weight, and BMI. Blood pressure. Lipid and cholesterol levels. These may be checked every 5 years, or more frequently if you are over 38 years old. Skin check. Lung cancer screening. You may have this screening every year starting at age 78 if you have a 30-pack-year history of smoking and currently smoke or have quit within the past 15 years. Fecal occult blood test (FOBT) of the stool. You may have this test every year starting at age 24. Flexible sigmoidoscopy or colonoscopy. You may have a sigmoidoscopy every 5 years or a colonoscopy every 10 years starting at age 27. Prostate cancer screening. Recommendations will vary depending on your family history and other risks. Hepatitis C blood test. Hepatitis B blood test. Sexually transmitted disease (STD) testing. Diabetes screening. This is done by checking your blood sugar (glucose) after you have not eaten for a while (fasting). You may have this done  every 1-3 years. Abdominal aortic aneurysm (AAA) screening. You may need this if you are a current or former smoker. Osteoporosis. You may be screened starting at age 71 if you are at high risk. Talk with your health care provider about your test results, treatment options, and if necessary, the need for more tests. Vaccines  Your health care provider may recommend certain vaccines, such  as: Influenza vaccine. This is recommended every year. Tetanus, diphtheria, and acellular pertussis (Tdap, Td) vaccine. You may need a Td booster every 10 years. Zoster vaccine. You may need this after age 82. Pneumococcal 13-valent conjugate (PCV13) vaccine. One dose is recommended after age 99. Pneumococcal polysaccharide (PPSV23) vaccine. One dose is recommended after age 2. Talk to your health care provider about which screenings and vaccines you need and how often you need them. This information is not intended to replace advice given to you by your health care provider. Make sure you discuss any questions you have with your health care provider. Document Released: 01/04/2016 Document Revised: 08/27/2016 Document Reviewed: 10/09/2015 Elsevier Interactive Patient Education  2017 [place] Prevention in the Home Falls can cause injuries. They can happen to people of all ages. There are many things you can do to make your home safe and to help prevent falls. What can I do on the outside of my home? Regularly fix the edges of walkways and driveways and fix any cracks. Remove anything that might make you trip as you walk through a door, such as a raised step or threshold. Trim any bushes or trees on the path to your home. Use bright outdoor lighting. Clear any walking paths of anything that might make someone trip, such as rocks or tools. Regularly check to see if handrails are loose or broken. Make sure that both sides of any steps have handrails. Any raised decks and porches should have guardrails on the edges. Have any leaves, snow, or ice cleared regularly. Use sand or salt on walking paths during winter. Clean up any spills in your garage right away. This includes oil or grease spills. What can I do in the bathroom? Use night lights. Install grab bars by the toilet and in the tub and shower. Do not use towel bars as grab bars. Use non-skid mats or decals in the tub or  shower. If you need to sit down in the shower, use a plastic, non-slip stool. Keep the floor dry. Clean up any water that spills on the floor as soon as it happens. Remove soap buildup in the tub or shower regularly. Attach bath mats securely with double-sided non-slip rug tape. Do not have throw rugs and other things on the floor that can make you trip. What can I do in the bedroom? Use night lights. Make sure that you have a light by your bed that is easy to reach. Do not use any sheets or blankets that are too big for your bed. They should not hang down onto the floor. Have a firm chair that has side arms. You can use this for support while you get dressed. Do not have throw rugs and other things on the floor that can make you trip. What can I do in the kitchen? Clean up any spills right away. Avoid walking on wet floors. Keep items that you use a lot in easy-to-reach places. If you need to reach something above you, use a strong step stool that has a grab bar. Keep electrical cords out of the  way. Do not use floor polish or wax that makes floors slippery. If you must use wax, use non-skid floor wax. Do not have throw rugs and other things on the floor that can make you trip. What can I do with my stairs? Do not leave any items on the stairs. Make sure that there are handrails on both sides of the stairs and use them. Fix handrails that are broken or loose. Make sure that handrails are as long as the stairways. Check any carpeting to make sure that it is firmly attached to the stairs. Fix any carpet that is loose or worn. Avoid having throw rugs at the top or bottom of the stairs. If you do have throw rugs, attach them to the floor with carpet tape. Make sure that you have a light switch at the top of the stairs and the bottom of the stairs. If you do not have them, ask someone to add them for you. What else can I do to help prevent falls? Wear shoes that: Do not have high heels. Have  rubber bottoms. Are comfortable and fit you well. Are closed at the toe. Do not wear sandals. If you use a stepladder: Make sure that it is fully opened. Do not climb a closed stepladder. Make sure that both sides of the stepladder are locked into place. Ask someone to hold it for you, if possible. Clearly mark and make sure that you can see: Any grab bars or handrails. First and last steps. Where the edge of each step is. Use tools that help you move around (mobility aids) if they are needed. These include: Canes. Walkers. Scooters. Crutches. Turn on the lights when you go into a dark area. Replace any light bulbs as soon as they burn out. Set up your furniture so you have a clear path. Avoid moving your furniture around. If any of your floors are uneven, fix them. If there are any pets around you, be aware of where they are. Review your medicines with your doctor. Some medicines can make you feel dizzy. This can increase your chance of falling. Ask your doctor what other things that you can do to help prevent falls. This information is not intended to replace advice given to you by your health care provider. Make sure you discuss any questions you have with your health care provider. Document Released: 10/04/2009 Document Revised: 05/15/2016 Document Reviewed: 01/12/2015 Elsevier Interactive Patient Education  2017 [employer].

## 2022-12-23 ENCOUNTER — Encounter: Payer: Medicare HMO | Admitting: Internal Medicine

## 2022-12-26 ENCOUNTER — Ambulatory Visit
Admission: EM | Admit: 2022-12-26 | Discharge: 2022-12-26 | Disposition: A | Discharge status: Home / Self Care | Payer: PPO | Attending: Nurse Practitioner | Admitting: Nurse Practitioner

## 2022-12-26 ENCOUNTER — Ambulatory Visit: Payer: Medicare HMO | Admitting: Family Medicine

## 2022-12-26 DIAGNOSIS — J069 Acute upper respiratory infection, unspecified: Secondary | ICD-10-CM

## 2022-12-26 MED ORDER — BENZONATATE 100 MG PO CAPS: 100.0000 mg | ORAL_CAPSULE | Freq: Three times a day (TID) | ORAL | 0 refills | Status: DC

## 2022-12-26 NOTE — ED Triage Notes (Signed)
Pt presents to [facility] with co of 3 days of cough congestion ha and sob. Pt reports sinus pressure medications otc.

## 2022-12-26 NOTE — Discharge Instructions (Signed)
Your symptoms and exam are consistent for a viral illness. Please treat your symptoms with over the counter, tylenol or ibuprofen, humidifier, and rest. Tessalon as needed for cough. Viral illnesses can last 7-14 days. Please follow up with your PCP if your symptoms are not improving. Please go to the ER for any worsening symptoms. This includes but is not limited to fever you can not control with tylenol or ibuprofen, you are not able to stay hydrated, you have shortness of breath or chest pain.  Thank you for choosing [facility] for your healthcare needs. I hope you feel better soon!

## 2022-12-26 NOTE — ED Provider Notes (Addendum)
[facility]    CSN: [id number] Arrival date & time: 12/26/22  1810      History   Chief Complaint Chief Complaint  Patient presents with   URI    HPI Jeremy Wolf is a 67 y.o. male who presents for evaluation of URI symptoms for 3 days. Patient reports associated symptoms of cough, congestion, headache. Denies N/V/D, fevers, chills, sore throat, ear pain, shortness of breath. Patient does not have a hx of asthma or smoking. No known sick contacts and no recent travel. Pt is vaccinated for COVID. Pt is not vaccinated for flu this season. Pt has taken sinus medication OTC for symptoms. Pt has no other concerns at this time.    URI Presenting symptoms: congestion and cough   Associated symptoms: headaches     Past Medical History:  Diagnosis Date   Allergic rhinitis    Allergy    Atrial fibrillation ([place]) 02/10/2009   Centricity Description: FIBRILLATION, ATRIAL Qualifier: Diagnosis of  By: Percival Spanish, MD, Farrel Gordon   Centricity Description: ATRIAL FIBRILLATION Qualifier: Diagnosis of  By: Sarajane Jews MD, Ishmael Holter    Blood transfusion without reported diagnosis 2010   after heart surgery   CAD (coronary artery disease)    a. cath 12/09: oD1 50%, EF 65%;  b. negative ETT 7/11   Cataract    removed left eye with lens implant    Constipation    uses Miralax   History of atrial fibrillation    post op AFib   Hx of colonic polyps    Hyperlipidemia    Mitral valve prolapse    a. with severe regurgitation; s/p MV repair 2/10;  b. 07/2011 Echo: EF 50-55%, mild MR.   Nephrolithiasis    hx of   Patent foramen ovale    a. s/p closure at MV repair in 2010   Plantar fasciitis of left foot 07/06/2012   Premature birth ?    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   Bradycardia 05/08/2017   Visit for preventive health examination 07/24/2014   Hx of colonic polyps    Visual disturbances 07/19/2013   Transient visual disturbance, bilateral 07/19/2013   Plantar fasciitis of left foot 07/06/2012   S/P mitral valve repair 07/10/2011   Preventative health care 07/09/2011   Mitral valve prolapse    Nephrolithiasis    OBSTRUCTIVE SLEEP APNEA 12/13/2009   HYPERSOMNIA 10/24/2009   TESTICULAR HYPOFUNCTION 10/14/2009   SNORING 10/12/2009   LIBIDO, DECREASED 10/12/2009   WEIGHT GAIN, ABNORMAL 04/25/2009   PATENT FORAMEN OVALE 02/10/2009   HEARTBURN 11/06/2008   HYPERLIPIDEMIA 03/06/2008   ERECTILE DYSFUNCTION 03/06/2008   MITRAL VALVE PROLAPSE 03/06/2008   Allergic rhinitis 03/06/2008   FATIGUE 03/06/2008   HEADACHE 03/06/2008   CARDIAC MURMUR 03/06/2008   CHEST PAIN 03/06/2008   NEPHROLITHIASIS, HX OF 03/06/2008    Past Surgical History:  Procedure Laterality Date   COLONOSCOPY  HERNIA REPAIR Right 2012   Inguinal 3 years ago   PATENT FORAMEN OVALE CLOSURE  2010   POLYPECTOMY     SPINE SURGERY  12/09/2010   vertebra 5 & 6   THORACOTOMY  2010   Rt miniature for mitral valve repair, quadrangular resection of posterior leaftlet with transposition of  native chordae tendineae x 2 and 85m Edwards Physio II ring annuloplasty       Home Medications    Prior to Admission medications   Medication Sig Start Date End Date Taking? Authorizing Provider  benzonatate (TESSALON) 100 MG capsule Take 1 capsule (100 mg total) by mouth every 8 (eight) hours. 12/26/22  Yes MMelynda Ripple NP  aspirin EC 81 MG tablet Take 81 mg by mouth daily.    [provider]  doxycycline  (VIBRAMYCIN) 100 MG capsule Take 1 capsule (100 mg total) by mouth 2 (two) times daily. 08/29/22   Nafziger, CTommi Rumps NP  Misc Natural Products (RED WINE EXTRACT) CAPS Take by mouth.    [provider]  predniSONE (DELTASONE) 10 MG tablet 40 mg x 3 days, 20 mg x 3 days, 10 mg x 3 days 08/29/22   NDorothyann Peng NP  rosuvastatin (CRESTOR) 20 MG tablet TAKE 1 TABLET BY MOUTH EVERY DAY 08/21/22   HMinus Breeding MD  sildenafil (VIAGRA) 50 MG tablet Take 1 tablet (50 mg total) by mouth daily as needed for erectile dysfunction. 03/20/22   HMinus Breeding MD    Family History Family History  Problem Relation Age of Onset   Diabetes Mother    Hyperlipidemia Mother    Hyperlipidemia Father    Heart disease Father        heart surgery 885? cabd and valve   Cancer Daughter 24      ovarian   Lung cancer Maternal Grandfather        lung   Emphysema Maternal Grandfather    Arthritis Paternal Grandmother    Arthritis Paternal Grandfather    Rheumatic fever Unknown        2 sisters   Other Sister        twin hx of substance use   Colon cancer Neg Hx    Colon polyps Neg Hx    Esophageal cancer Neg Hx    Rectal cancer Neg Hx    Stomach cancer Neg Hx     Social History Social History   Tobacco Use   Smoking status: Never   Smokeless tobacco: Never  Vaping Use   Vaping Use: Never used  Substance Use Topics   Alcohol use: Yes    Alcohol/week: 0.0 standard drinks of alcohol    Comment: OCC. WINE   Drug use: No     Allergies   Patient has no known allergies.   Review of Systems Review of Systems  HENT:  Positive for congestion.   Respiratory:  Positive for cough.   Neurological:  Positive for headaches.     Physical Exam Triage Vital Signs ED Triage Vitals [12/26/22 1903]  Enc Vitals Group     BP 131/84     Pulse Rate 80     Resp 19     Temp 98.5 F (36.9 C)     Temp Source Oral     SpO2 98 %     Weight      Height      Head Circumference      Peak Flow       Pain Score  0     Pain Loc      Pain Edu?      Excl. in [place]?    No data found.  Updated Vital Signs BP 131/84   Pulse 80   Temp 98.5 F (36.9 C) (Oral)   Resp 19   SpO2 98%   Visual Acuity Right Eye Distance:   Left Eye Distance:   Bilateral Distance:    Right Eye Near:   Left Eye Near:    Bilateral Near:     Physical Exam Vitals and nursing note reviewed.  Constitutional:      General: He is not in acute distress.    Appearance: Normal appearance. He is not ill-appearing or toxic-appearing.  HENT:     Head: Normocephalic and atraumatic.     Right Ear: Tympanic membrane and ear canal normal.     Left Ear: Tympanic membrane and ear canal normal.     Nose: Congestion present.     Mouth/Throat:     Mouth: Mucous membranes are moist.     Pharynx: Posterior oropharyngeal erythema present.  Eyes:     Pupils: Pupils are equal, round, and reactive to light.  Cardiovascular:     Rate and Rhythm: Normal rate and regular rhythm.     Heart sounds: Normal heart sounds.  Pulmonary:     Effort: Pulmonary effort is normal.     Breath sounds: Normal breath sounds.  Musculoskeletal:     Cervical back: Normal range of motion and neck supple.  Lymphadenopathy:     Cervical: No cervical adenopathy.  Skin:    General: Skin is warm and dry.  Neurological:     General: No focal deficit present.     Mental Status: He is alert and oriented to person, place, and time.  Psychiatric:        Mood and Affect: Mood normal.        Behavior: Behavior normal.      UC Treatments / Results  Labs (all labs ordered are listed, but only abnormal results are displayed) Labs Reviewed - No data to display  EKG   Radiology No results found.  Procedures Procedures (including critical care time)  Medications Ordered in UC Medications - No data to display  Initial Impression / Assessment and Plan / UC Course  I have reviewed the triage vital signs and the nursing notes.  Pertinent  labs & imaging results that were available during my care of the patient were reviewed by me and considered in my medical decision making (see chart for details).     Discussed with patient viral illness and symptomatic treatment  Tessalon as needed Declined COVID testing The counter analgesics as needed for headache Rest and fluids Follow up with PCP in 2-3 days for re-check  ER precautions reviewed and pt verbalized understanding  Final Clinical Impressions(s) / UC Diagnoses   Final diagnoses:  Viral upper respiratory illness     Discharge Instructions      Your symptoms and exam are consistent for a viral illness. Please treat your symptoms with over the counter, tylenol or ibuprofen, humidifier, and rest. Tessalon as needed for cough. Viral illnesses can last 7-14 days. Please follow up with your PCP if your symptoms are not improving. Please go to the ER for any worsening symptoms. This includes but is not limited to fever you can not control with tylenol or ibuprofen, you are not able to stay hydrated, you have shortness of breath or chest pain.  Thank you for choosing [facility] for your healthcare needs. I hope you feel better soon!    ED Prescriptions     Medication Sig Dispense Auth. Provider   benzonatate (TESSALON) 100 MG capsule Take 1 capsule (100 mg total) by mouth every 8 (eight) hours. 21 capsule Melynda Ripple, NP      PDMP not reviewed this encounter.   Melynda Ripple, NP 12/26/22 1918    Melynda Ripple, NP 12/26/22 1919

## 2023-01-29 ENCOUNTER — Encounter: Payer: Medicare HMO | Admitting: Internal Medicine

## 2023-02-17 ENCOUNTER — Encounter: Payer: Medicare HMO | Admitting: Internal Medicine

## 2023-03-02 DIAGNOSIS — M25562 Pain in left knee: Secondary | ICD-10-CM | POA: Diagnosis not present

## 2023-03-11 NOTE — Progress Notes (Signed)
Chief Complaint  Patient presents with   Annual Exam    HPI: Patient  Jeremy Wolf  67 y.o. comes in today for [facility] visit   Hx MVrepair  had  updated echo 10 23 no change stable valve Hld crestor 20  no concern  March Rummage  urinary flow change   but no ongoing sx   Ocass r tongue sore and gland On going plaque rash  used friends tmc ointment with some help.  Health Maintenance  Topic Date Due   Zoster Vaccines- Shingrix (1 of 2) 03/17/2023 (Originally 12/05/1975)   INFLUENZA VACCINE  03/22/2023 (Originally 07/22/2022)   COVID-19 Vaccine (1) 03/28/2023 (Originally 12/04/1961)   Pneumonia Vaccine 27+ Years old (1 of 1 - PCV) 12/17/2023 (Originally 12/04/2021)   DTaP/Tdap/Td (3 - Td or Tdap) 07/20/2023   COLONOSCOPY (Pts 45-67yrs Insurance coverage will need to be confirmed)  10/16/2023   Medicare Annual Wellness (AWV)  12/17/2023   Hepatitis C Screening  Completed   HPV VACCINES  Aged Out   Health Maintenance Review LIFESTYLE:  Exercise:   Tobacco/ETS: no Alcohol:  n Sugar beverages: sweet tea  once a day.  Black coffee  Sleep: ave 9 - 5  Drug use: no HH of  2  1 cat.  Work: FT.   Designer, television/film set  and delivery     ROS:  GEN/ HEENT: No fever, significant weight changes sweats headaches vision problems hearing changes, CV/ PULM; No chest pain shortness of breath cough, syncope,edema  change in exercise tolerance. GI /GU: No adominal pain, vomiting, change in bowel habits. No blood in the stool. No significant GU symptoms. SKIN/HEME: ,no acute skin rashes suspicious lesions or bleeding. No lymphadenopathy, nodules, masses.  NEURO/ PSYCH:  No neurologic signs such as weakness numbness. No depression anxiety. IMM/ Allergy: No unusual infections.  Allergy .   REST of 12 system review negative except as per HPI   Past Medical History:  Diagnosis Date   Allergic rhinitis    Allergy    Atrial fibrillation ([place]) 02/10/2009   Centricity Description: FIBRILLATION,  ATRIAL Qualifier: Diagnosis of  By: Percival Spanish, MD, Farrel Gordon   Centricity Description: ATRIAL FIBRILLATION Qualifier: Diagnosis of  By: Sarajane Jews MD, Ishmael Holter    Blood transfusion without reported diagnosis 2010   after heart surgery   CAD (coronary artery disease)    a. cath 12/09: oD1 50%, EF 65%;  b. negative ETT 7/11   Cataract    removed left eye with lens implant    Constipation    uses Miralax   History of atrial fibrillation    post op AFib   Hx of colonic polyps    Hyperlipidemia    Mitral valve prolapse    a. with severe regurgitation; s/p MV repair 2/10;  b. 07/2011 Echo: EF 50-55%, mild MR.   Nephrolithiasis    hx of   Patent foramen ovale    a. s/p closure at MV repair in 2010   Plantar fasciitis of left foot 07/06/2012   Premature birth ?    twin  was in hosp 30 days feeding lazy eye small weight    Rheumatic fever with heart involvement    MVP per pt due to this with repair 2010   Sleep apnea    no cpap    Past Surgical History:  Procedure Laterality Date   COLONOSCOPY     HERNIA REPAIR Right 2012   Inguinal 3 years ago   PATENT FORAMEN OVALE  CLOSURE  2010   POLYPECTOMY     SPINE SURGERY  12/09/2010   vertebra 5 & 6   THORACOTOMY  2010   Rt miniature for mitral valve repair, quadrangular resection of posterior leaftlet with transposition of  native chordae tendineae x 2 and 35mm Edwards Physio II ring annuloplasty    Family History  Problem Relation Age of Onset   Diabetes Mother    Hyperlipidemia Mother    Hyperlipidemia Father    Heart disease Father        heart surgery 69 ? cabd and valve   Cancer Daughter 23       ovarian   Lung cancer Maternal Grandfather        lung   Emphysema Maternal Grandfather    Arthritis Paternal Grandmother    Arthritis Paternal Grandfather    Rheumatic fever Unknown        2 sisters   Other Sister        twin hx of substance use   Colon cancer Neg Hx    Colon polyps Neg Hx    Esophageal cancer Neg Hx    Rectal  cancer Neg Hx    Stomach cancer Neg Hx     Social History   Socioeconomic History   Marital status: Married    Spouse name: Not on file   Number of children: 5   Years of education: Not on file   Highest education level: Not on file  Occupational History   Occupation: Truck Geophysicist/field seismologist  Tobacco Use   Smoking status: Never   Smokeless tobacco: Never  Vaping Use   Vaping Use: Never used  Substance and Sexual Activity   Alcohol use: Yes    Alcohol/week: 0.0 standard drinks of alcohol    Comment: OCC. 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 - 65 h per week new job 60 hours per week    Sleeps; 7- 8 hours   Married (second marriage) 4 children   Regular exercise- no    Married summer 2007   caffeine 10-12 cups coffee   HH of 2   No pets   Originally from the [place]   Was a twin birth midly  Premature  Fraternal  Twin sister    Social Determinants of Health   Financial Resource Strain: Low Risk  (12/16/2022)   Overall Financial Resource Strain (CARDIA)    Difficulty of Paying Living Expenses: Not hard at all  Food Insecurity: No Food Insecurity (12/16/2022)   Hunger Vital Sign    Worried About Running Out of Food in the Last Year: Never true    Ran Out of Food in the Last Year: Never true  Transportation Needs: No Transportation Needs (12/16/2022)   PRAPARE - Hydrologist (Medical): No    Lack of Transportation (Non-Medical): No  Physical Activity: Inactive (12/16/2022)   Exercise Vital Sign    Days of Exercise per Week: 0 days    Minutes of Exercise per Session: 0 min  Stress: No Stress Concern Present (12/16/2022)   [place]    Feeling of Stress : Not at all  Social Connections: [place] (12/16/2022)   Social  Connection and Isolation Panel [NHANES]    Frequency  of Communication with Friends and Family: More than three times a week    Frequency of Social Gatherings with Friends and Family: More than three times a week    Attends Religious Services: More than 4 times per year    Active Member of Clubs or Organizations: Yes    Attends Music therapist: More than 4 times per year    Marital Status: Married    Outpatient Medications Prior to Visit  Medication Sig Dispense Refill   aspirin EC 81 MG tablet Take 81 mg by mouth daily.     Misc Natural Products (RED WINE EXTRACT) CAPS Take by mouth.     rosuvastatin (CRESTOR) 20 MG tablet TAKE 1 TABLET BY MOUTH EVERY DAY 90 tablet 3   triamcinolone ointment (KENALOG) 0.1 % Apply 1 Application topically 2 (two) times daily.     sildenafil (VIAGRA) 50 MG tablet Take 1 tablet (50 mg total) by mouth daily as needed for erectile dysfunction. (Patient not taking: Reported on 03/12/2023) 10 tablet 11   benzonatate (TESSALON) 100 MG capsule Take 1 capsule (100 mg total) by mouth every 8 (eight) hours. 21 capsule 0   doxycycline (VIBRAMYCIN) 100 MG capsule Take 1 capsule (100 mg total) by mouth 2 (two) times daily. 14 capsule 0   predniSONE (DELTASONE) 10 MG tablet 40 mg x 3 days, 20 mg x 3 days, 10 mg x 3 days 21 tablet 0   No facility-administered medications prior to visit.     EXAM:  BP 118/70 (BP Location: Right Arm, Patient Position: Sitting, Cuff Size: Normal)   Pulse 62   Temp 97.9 F (36.6 C) (Oral)   Ht 5\' 9"  (1.753 m)   Wt 198 lb 12.8 oz (90.2 kg)   SpO2 95%   BMI 29.36 kg/m   Body mass index is 29.36 kg/m. Wt Readings from Last 3 Encounters:  03/12/23 198 lb 12.8 oz (90.2 kg)  12/16/22 203 lb (92.1 kg)  08/29/22 203 lb (92.1 kg)    Physical Exam: Vital signs reviewed [id number] is a well-developed well-nourished alert cooperative    who appearsr stated age in no acute distress.  HEENT: normocephalic atraumatic , Eyes:  PERRL EOM's full, conjunctiva clear, Nares: paten,t no deformity discharge or tenderness., Ears: no deformity EAC's clear TMs with normal landmarks. Mouth: clear OP, no lesions, edema.  Moist mucous membranes. Dentition in adequate repair. NECK: supple without masses, thyromegaly or bruits. CHEST/PULM:  Clear to auscultation and percussion breath sounds equal no wheeze , rales or rhonchi. No chest wall deformities or tenderness. Breast: normal by inspection . No dimpling, discharge, masses, tenderness or discharge . CV: PMI is nondisplaced, S1 S2 no gallops, murmurs, rubs. Peripheral pulses are full without delay.No JVD .  ABDOMEN: Bowel sounds normal nontender  No guard or rebound, no hepato splenomegal no CVA tenderness.  No hernia. Extremtities:  No clubbing cyanosis or edema, no acute joint swelling or redness no focal atrophy NEURO:  Oriented x3, cranial nerves 3-12 appear to be intact, no obvious focal weakness,gait within normal limits no abnormal reflexes or asymmetrical SKIN: No acute rashes normal turgor, color, no bruising or petechiae.  Rle shin area  large pink plaque like rash  some vv no ulcers  PSYCH: Oriented, good eye contact, no obvious depression anxiety, cognition and judgment appear normal. LN: no cervical axillary inguinal adenopathy  Lab Results  Component Value Date   WBC 6.6 03/12/2023   HGB 14.0 03/12/2023   HCT 41.4 03/12/2023  PLT 216.0 03/12/2023   GLUCOSE 83 03/12/2023   CHOL 149 03/12/2023   TRIG 114.0 03/12/2023   HDL 52.50 03/12/2023   LDLDIRECT 154.7 07/12/2013   LDLCALC 74 03/12/2023   ALT 20 03/12/2023   AST 19 03/12/2023   NA 138 03/12/2023   K 4.2 03/12/2023   CL 102 03/12/2023   CREATININE 0.97 03/12/2023   BUN 20 03/12/2023   CO2 29 03/12/2023   TSH 1.78 03/12/2023   PSA 1.91 03/12/2023   INR 2.0 05/23/2009   HGBA1C 5.7 03/12/2023    BP Readings from Last 3 Encounters:  03/12/23 118/70  12/26/22 131/84  08/29/22 110/80    Lab plan   reviewed with patient  UA is clear  ASSESSMENT AND PLAN:  Discussed the following assessment and plan:    ICD-10-CM   1. Visit for preventive health examination  Z00.00     2. Hyperlipidemia, unspecified hyperlipidemia type  99991111 Basic metabolic panel    CBC with Differential/Platelet    Hemoglobin A1c    Hepatic function panel    Lipid panel    TSH    3. Medication management  [id number] Basic metabolic panel    CBC with Differential/Platelet    Hemoglobin A1c    Hepatic function panel    Lipid panel    TSH    PSA    4. S/P mitral valve repair  0000000 Basic metabolic panel    CBC with Differential/Platelet    Hemoglobin A1c    Hepatic function panel    Lipid panel    TSH    PSA    5. Screening PSA (prostate specific antigen)  [id number] Basic metabolic panel    CBC with Differential/Platelet    Hemoglobin A1c    Hepatic function panel    Lipid panel    TSH    PSA    6. Skin rash  [id number] Basic metabolic panel    CBC with Differential/Platelet    Hemoglobin A1c    Hepatic function panel    Lipid panel    TSH    PSA   chronic  has seen  derm in past ? dx? will rx tmc    7. Lower urinary tract symptoms (LUTS)  [id number] Basic metabolic panel    CBC with Differential/Platelet    Hemoglobin A1c    Hepatic function panel    Lipid panel    TSH    PSA    POC Urinalysis Dipstick     Return in about 1 year (around 03/11/2024) for depending on results.  Patient Care Team: Angelito Hopping, Standley Brooking, MD as PCP - General Minus Breeding, MD as PCP - Cardiology (Cardiology) Minus Breeding, MD (Cardiology) Newman Pies, MD (Neurosurgery) Loletha Carrow Kirke Corin, MD as Consulting Physician (Gastroenterology) Danella Sensing, MD as Consulting Physician (Dermatology) Patient Instructions  Good to see you today   Will  order  tmc ointment to continue to try  for the leg patch rash . Fasting  lab today    Standley Brooking. Renatta Shrieves M.D.

## 2023-03-12 ENCOUNTER — Ambulatory Visit (INDEPENDENT_AMBULATORY_CARE_PROVIDER_SITE_OTHER): Payer: PPO | Admitting: Internal Medicine

## 2023-03-12 ENCOUNTER — Encounter: Payer: Self-pay | Admitting: Internal Medicine

## 2023-03-12 VITALS — BP 118/70 | HR 62 | Temp 97.9°F | Ht 69.0 in | Wt 198.8 lb

## 2023-03-12 DIAGNOSIS — R21 Rash and other nonspecific skin eruption: Secondary | ICD-10-CM | POA: Diagnosis not present

## 2023-03-12 DIAGNOSIS — Z79899 Other long term (current) drug therapy: Secondary | ICD-10-CM | POA: Diagnosis not present

## 2023-03-12 DIAGNOSIS — Z9889 Other specified postprocedural states: Secondary | ICD-10-CM

## 2023-03-12 DIAGNOSIS — R399 Unspecified symptoms and signs involving the genitourinary system: Secondary | ICD-10-CM | POA: Diagnosis not present

## 2023-03-12 DIAGNOSIS — E785 Hyperlipidemia, unspecified: Secondary | ICD-10-CM

## 2023-03-12 DIAGNOSIS — Z125 Encounter for screening for malignant neoplasm of prostate: Secondary | ICD-10-CM | POA: Diagnosis not present

## 2023-03-12 DIAGNOSIS — Z0000 Encounter for general adult medical examination without abnormal findings: Secondary | ICD-10-CM

## 2023-03-12 LAB — CBC WITH DIFFERENTIAL/PLATELET
Basophils Absolute: 0 10*3/uL (ref 0.0–0.1)
Basophils Relative: 0.5 % (ref 0.0–3.0)
Eosinophils Absolute: 0.2 10*3/uL (ref 0.0–0.7)
Eosinophils Relative: 2.7 % (ref 0.0–5.0)
HCT: 41.4 % (ref 39.0–52.0)
Hemoglobin: 14 g/dL (ref 13.0–17.0)
Lymphocytes Relative: 42.4 % (ref 12.0–46.0)
Lymphs Abs: 2.8 10*3/uL (ref 0.7–4.0)
MCHC: 33.7 g/dL (ref 30.0–36.0)
MCV: 86.5 fl (ref 78.0–100.0)
Monocytes Absolute: 0.4 10*3/uL (ref 0.1–1.0)
Monocytes Relative: 5.9 % (ref 3.0–12.0)
Neutro Abs: 3.2 10*3/uL (ref 1.4–7.7)
Neutrophils Relative %: 48.5 % (ref 43.0–77.0)
Platelets: 216 10*3/uL (ref 150.0–400.0)
RBC: 4.78 Mil/uL (ref 4.22–5.81)
RDW: 13.7 % (ref 11.5–15.5)
WBC: 6.6 10*3/uL (ref 4.0–10.5)

## 2023-03-12 LAB — LIPID PANEL
Cholesterol: 149 mg/dL (ref 0–200)
HDL: 52.5 mg/dL (ref >39.00)
LDL Cholesterol: 74 mg/dL (ref 0–99)
NonHDL: 96.92
Total CHOL/HDL Ratio: 3
Triglycerides: 114 mg/dL (ref 0.0–149.0)
VLDL: 22.8 mg/dL (ref 0.0–40.0)

## 2023-03-12 LAB — POCT URINALYSIS DIPSTICK
Bilirubin, UA: NEGATIVE
Blood, UA: NEGATIVE
Glucose, UA: NEGATIVE
Ketones, UA: NEGATIVE
Leukocytes, UA: NEGATIVE
Nitrite, UA: NEGATIVE
Protein, UA: NEGATIVE
Spec Grav, UA: 1.015 (ref 1.010–1.025)
Urobilinogen, UA: 0.2 E.U./dL
pH, UA: 6 (ref 5.0–8.0)

## 2023-03-12 LAB — BASIC METABOLIC PANEL
BUN: 20 mg/dL (ref 6–23)
CO2: 29 mEq/L (ref 19–32)
Calcium: 9.7 mg/dL (ref 8.4–10.5)
Chloride: 102 mEq/L (ref 96–112)
Creatinine, Ser: 0.97 mg/dL (ref 0.40–1.50)
GFR: 81.49 mL/min (ref >60.00)
Glucose, Bld: 83 mg/dL (ref 70–99)
Potassium: 4.2 mEq/L (ref 3.5–5.1)
Sodium: 138 mEq/L (ref 135–145)

## 2023-03-12 LAB — HEPATIC FUNCTION PANEL
ALT: 20 U/L (ref 0–53)
AST: 19 U/L (ref 0–37)
Albumin: 4.5 g/dL (ref 3.5–5.2)
Alkaline Phosphatase: 56 U/L (ref 39–117)
Bilirubin, Direct: 0.2 mg/dL (ref 0.0–0.3)
Total Bilirubin: 0.9 mg/dL (ref 0.2–1.2)
Total Protein: 6.9 g/dL (ref 6.0–8.3)

## 2023-03-12 LAB — PSA: PSA: 1.91 ng/mL (ref 0.10–4.00)

## 2023-03-12 LAB — TSH: TSH: 1.78 u[IU]/mL (ref 0.35–5.50)

## 2023-03-12 LAB — HEMOGLOBIN A1C: Hgb A1c MFr Bld: 5.7 % (ref 4.6–6.5)

## 2023-03-12 MED ORDER — TRIAMCINOLONE ACETONIDE 0.1 % EX OINT: 1.0000 | TOPICAL_OINTMENT | Freq: Two times a day (BID) | CUTANEOUS | 3 refills | Status: AC

## 2023-03-12 NOTE — Patient Instructions (Addendum)
Good to see you today   Will  order  tmc ointment to continue to try  for the leg patch rash . Fasting  lab today

## 2023-03-14 NOTE — Progress Notes (Signed)
Results are normal in range ; sharing with cariology team

## 2023-03-26 ENCOUNTER — Encounter: Payer: Self-pay | Admitting: Internal Medicine

## 2023-03-28 DIAGNOSIS — E669 Obesity, unspecified: Secondary | ICD-10-CM | POA: Diagnosis not present

## 2023-03-28 DIAGNOSIS — E785 Hyperlipidemia, unspecified: Secondary | ICD-10-CM | POA: Diagnosis not present

## 2023-03-28 DIAGNOSIS — G8929 Other chronic pain: Secondary | ICD-10-CM | POA: Diagnosis not present

## 2023-03-28 DIAGNOSIS — Z7982 Long term (current) use of aspirin: Secondary | ICD-10-CM | POA: Diagnosis not present

## 2023-03-30 NOTE — Telephone Encounter (Signed)
Blood test  of psa screening was done on yearly testing  and in normal range .

## 2023-05-13 ENCOUNTER — Telehealth: Payer: Self-pay | Admitting: Internal Medicine

## 2023-05-13 ENCOUNTER — Telehealth: Payer: Self-pay | Admitting: Cardiology

## 2023-05-13 NOTE — Telephone Encounter (Signed)
I am not aware of stent   he had mv repair  I dont think  a candidate for prophylaxis but advise contact cardiology  for confirmation.

## 2023-05-13 NOTE — Telephone Encounter (Signed)
   Patient Name: Jeremy Wolf  DOB: [birth year] MRN: [id number]  Primary Cardiologist: Rollene Rotunda, MD  Chart reviewed as part of pre-operative protocol coverage.   Simple dental extractions (i.e. 1-2 teeth), cleanings are considered low risk procedures per guidelines and generally do not require any specific cardiac clearance. It is also generally accepted that for simple extractions and dental cleanings, there is no need to interrupt blood thinner therapy.  SBE prophylaxis IS required for the patient from a cardiac standpoint.  I will route this recommendation to the requesting party via Epic fax function and remove from pre-op pool.  Please call with questions.  Joylene Grapes, NP 05/13/2023, 9:42 AM

## 2023-05-13 NOTE — Telephone Encounter (Signed)
Spoke to [facility] at 9:31am. Inform her to contact cardiologist. Given contact number to Dr. [place] Lions office.

## 2023-05-13 NOTE — Telephone Encounter (Signed)
    [facility] Pre-operative Risk Assessment    Request for surgical clearance:  What type of surgery is being performed?  Dental Cleaning   When is this surgery scheduled?  05/13/23   What type of clearance is required (medical clearance vs. Pharmacy clearance to hold med vs. Both)?  Both   Are there any medications that need to be held prior to surgery and how long?  Their office just wants to know if the patient needs to pre-med for a cleaning  Practice name and name of physician performing surgery?  Dr. Elder Cyphers[facility] Office  Elder Cyphers DDS  What is your office phone number? [phone]    7.   What is your office fax number? [phone]  8.   Anesthesia type (None, local, MAC, general)? None    Jeremy Wolf 05/13/2023, 9:34 AM

## 2023-05-13 NOTE — Telephone Encounter (Signed)
Drema with dr Elder Cyphers office is calling and they need to know if patient  needs to be premed for dental cleaning fax number [phone]. Pt  has stent

## 2023-05-20 ENCOUNTER — Ambulatory Visit (INDEPENDENT_AMBULATORY_CARE_PROVIDER_SITE_OTHER): Payer: PPO

## 2023-05-20 ENCOUNTER — Encounter: Payer: Self-pay | Admitting: Physician Assistant

## 2023-05-20 ENCOUNTER — Telehealth (HOSPITAL_COMMUNITY): Payer: Self-pay | Admitting: *Deleted

## 2023-05-20 ENCOUNTER — Ambulatory Visit: Payer: PPO | Attending: Physician Assistant | Admitting: Physician Assistant

## 2023-05-20 VITALS — BP 134/72 | HR 66 | Ht 69.0 in | Wt 208.6 lb

## 2023-05-20 DIAGNOSIS — R079 Chest pain, unspecified: Secondary | ICD-10-CM

## 2023-05-20 DIAGNOSIS — I499 Cardiac arrhythmia, unspecified: Secondary | ICD-10-CM | POA: Diagnosis not present

## 2023-05-20 DIAGNOSIS — E785 Hyperlipidemia, unspecified: Secondary | ICD-10-CM

## 2023-05-20 DIAGNOSIS — I25119 Atherosclerotic heart disease of native coronary artery with unspecified angina pectoris: Secondary | ICD-10-CM | POA: Diagnosis not present

## 2023-05-20 DIAGNOSIS — Z9889 Other specified postprocedural states: Secondary | ICD-10-CM

## 2023-05-20 NOTE — Progress Notes (Signed)
Cardiology Office Note:    Date:  05/20/2023   ID:  Jeremy Wolf, DOB 1956/10/19, MRN [id number]  PCP:  Madelin Headings, MD   [facility] Providers Cardiologist:  Rollene Rotunda, MD     Referring MD: Madelin Headings, MD   Chief Complaint  Patient presents with   Follow-up    Seen for Dr. Antoine Poche    History of Present Illness:    Jeremy Wolf is a 67 y.o. male with a hx of CAD, MVR in 01/2009, postop atrial fibrillation, hyperlipidemia and obstructive sleep apnea.  Cardiac catheterization performed on 05/19/2008 demonstrated EF 65%, mild luminal irregularities in proximal LAD, normal left main, 50% ostial D1 lesion, normal left circumflex artery and a normal dominant RCA.  Due to severe mitral regurgitation, patient ultimately underwent right miniatures thoracotomy for mitral valve repair, quadrangular resection of the posterior leaflet with transposition of the native chordae tendon a x 2 and a 34 mm Edwards Physio II ring annuloplasty and closure of patent foramen ovale by Dr. Cornelius Moras on 01/23/2009.  He wore a 24-hour Holter monitor in June 2018, this revealed occasional Mobitz type I heart block, mostly sinus rhythm was minimal heart rate 39, maximal heart rate 123.  Average heart rate 73.  Echocardiogram obtained on 09/25/2022 demonstrated EF 58%, no regional wall motion abnormality, normal RV, severe right atrial enlargement, repaired mitral valve with trivial mitral valve regurgitation, mild dilatation of the ascending aorta measuring at 38 mm.  He was last seen by Dr. Antoine Poche in March 2023 at which time he was doing well.  Patient presents today for 1 year follow-up.  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  EKG showed occasional dropped heartbeat and underlying first-degree AV block.  There was no clear finding to suggest second-degree type I heart block as previously indicated on the monitor in 2018.  There was no evidence of  atrial fibrillation or atrial flutter.  I suspect dropped heartbeat is what explains his irregular heart rate during DOT physical.  He says he has occasional dizziness when he get up too quickly but there was no dizziness or feeling of passing out otherwise.  He does not have any symptomatic bradycardia.  He has been noticing occasional chest tightness when he does activity.  The symptom is quite mild.  I recommended a treadmill nuclear stress test to further assess.  If both heart monitor and a treadmill nuclear stress test are normal, he can follow-up in 1 year.  Note, I did not order a Lexiscan Myoview or PET stress test as Eugenie Birks is contraindicated with prior history of Mobitz type I heart block.   Past Medical History:  Diagnosis Date   Allergic rhinitis    Allergy    Atrial fibrillation (HCC) 02/10/2009   Centricity Description: FIBRILLATION, ATRIAL Qualifier: Diagnosis of  By: Antoine Poche, MD, Gerrit Heck   Centricity Description: ATRIAL FIBRILLATION Qualifier: Diagnosis of  By: Clent Ridges MD, Tera Mater    Blood transfusion without reported diagnosis 2010   after heart surgery   CAD (coronary artery disease)    a. cath 12/09: oD1 50%, EF 65%;  b. negative ETT 7/11   Cataract    removed left eye with lens implant    Constipation    uses Miralax   History of atrial fibrillation    post op AFib   Hx of colonic polyps    Hyperlipidemia    Mitral valve prolapse  a. with severe regurgitation; s/p MV repair 2/10;  b. 07/2011 Echo: EF 50-55%, mild MR.   Nephrolithiasis    hx of   Patent foramen ovale    a. s/p closure at MV repair in 2010   Plantar fasciitis of left foot 07/06/2012   Premature birth ?    twin  was in hosp 30 days feeding lazy eye small weight    Rheumatic fever with heart involvement    MVP per pt due to this with repair 2010   Sleep apnea    no cpap    Past Surgical History:  Procedure Laterality Date   COLONOSCOPY     HERNIA REPAIR Right 2012   Inguinal 3 years ago    PATENT FORAMEN OVALE CLOSURE  2010   POLYPECTOMY     SPINE SURGERY  12/09/2010   vertebra 5 & 6   THORACOTOMY  2010   Rt miniature for mitral valve repair, quadrangular resection of posterior leaftlet with transposition of  native chordae tendineae x 2 and 34mm Edwards Physio II ring annuloplasty    Current Medications: Current Meds  Medication Sig   aspirin EC 81 MG tablet Take 81 mg by mouth daily.   Misc Natural Products (RED WINE EXTRACT) CAPS Take by mouth.   rosuvastatin (CRESTOR) 20 MG tablet TAKE 1 TABLET BY MOUTH EVERY DAY   triamcinolone ointment (KENALOG) 0.1 % Apply 1 Application topically 2 (two) times daily.     Allergies:   Patient has no known allergies.   Social History   Socioeconomic History   Marital status: Married    Spouse name: Not on file   Number of children: 5   Years of education: Not on file   Highest education level: Not on file  Occupational History   Occupation: [employer] doctor  Tobacco Use   Smoking status: Never   Smokeless tobacco: Never  Vaping Use   Vaping Use: Never used  Substance and Sexual Activity   Alcohol use: Yes    Alcohol/week: 0.0 standard drinks of alcohol    Comment: OCC. 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    Sleeps; 7- 8 hours   Married (second marriage) 4 children   Regular exercise- no    Married summer 2007   caffeine 10-12 cups coffee   HH of 2   No pets   Originally from the [place]   Was a twin birth midly  Premature  Fraternal  Twin sister    Social Determinants of Health   Financial Resource Strain: Low Risk  (12/16/2022)   Overall Financial Resource Strain (CARDIA)    Difficulty of Paying Living Expenses: Not hard at all  Food Insecurity: No Food Insecurity (12/16/2022)   Hunger Vital Sign    Worried  About Running Out of Food in the Last Year: Never true    Ran Out of Food in the Last Year: Never true  Transportation Needs: No Transportation Needs (12/16/2022)   PRAPARE - Administrator, Civil Service (Medical): No    Lack of Transportation (Non-Medical): No  Physical Activity: Inactive (12/16/2022)   Exercise Vital Sign    Days of Exercise per Week: 0 days    Minutes of Exercise  per Session: 0 min  Stress: No Stress Concern Present (12/16/2022)   [employer] of Occupational Health - Occupational Stress Questionnaire    Feeling of Stress : Not at all  Social Connections: Socially Integrated (12/16/2022)   Social Connection and Isolation Panel [NHANES]    Frequency of Communication with Friends and Family: More than three times a week    Frequency of Social Gatherings with Friends and Family: More than three times a week    Attends Religious Services: More than 4 times per year    Active Member of [employer] or Organizations: Yes    Attends Engineer, structural: More than 4 times per year    Marital Status: Married     Family History: The patient's family history includes Arthritis in his paternal grandfather and paternal grandmother; Cancer (age of onset: 66) in his daughter; Diabetes in his mother; Emphysema in his maternal grandfather; Heart disease in his father; Hyperlipidemia in his father and mother; Lung cancer in his maternal grandfather; Other in his sister; Rheumatic fever in his unknown relative. There is no history of Colon cancer, Colon polyps, Esophageal cancer, Rectal cancer, or Stomach cancer.  ROS:   Please see the history of present illness.     All other systems reviewed and are negative.  EKGs/Labs/Other Studies Reviewed:    The following studies were reviewed today:  Cath 12/19/2008 RESULTS:  Hemodynamics:  RA mean 7, RV 31/8, PA 37/15 with a mean of 26,  pulmonary capillary wedge pressure mean 16 with a large V-wave, LV  101/13, and  AO 101/70.  Cardiac output/cardiac index (Fick) 4.2/2.1.  Coronaries:  Left main was normal.  The LAD had mild proximal luminal  irregularities.  First diagonal was moderate size with ostial 50%  stenosis.  Circumflex in the AV groove was normal.  There was mid obtuse  marginal which was large and normal.  Posterolateral was small and  normal.  The right coronary artery was dominant vessel and normal  throughout its course.  PDA was moderate size and normal.  Left  ventriculogram:  The left ventriculogram was obtained in the RAO  projection.  The EF was 65% with mild inferior hypokinesis and severe  mitral regurgitation.  Aortogram:  A distal aortogram was obtained at  the request of Dr. Cornelius Moras.  He will be cannulating the right groin for  the heart and lung bypass.  The aortogram including the bifurcation and  iliac vessels/femorals were free of any plaque.    CONCLUSIONS:  Severe mitral regurgitation.  Mild coronary plaquing.    PLAN:  The patient will be referred back to Dr. Cornelius Moras for consideration  of mitral valve repair.    Mitral valve repair 01/23/2009 by Dr. Cornelius Moras  PROCEDURE:  Right miniature thoracotomy for mitral valve repair  (quadrangular resection of posterior leaflet with transposition of  native chordae tendineae x2 and 34-mm Edwards Physio II ring  annuloplasty) and closure of patent foramen ovale.   Echo 09/29/2022 1. Left ventricular ejection fraction by 3D volume is 58 %. The left  ventricle has normal function. The left ventricle has no regional wall  motion abnormalities. Left ventricular diastolic parameters are  indeterminate.   2. Right ventricular systolic function is normal. The right ventricular  size is mildly enlarged. There is normal pulmonary artery systolic  pressure.   3. Right atrial size was severely dilated.   4. The mitral valve has been repaired/replaced. Trivial mitral valve  regurgitation. No evidence of mitral stenosis.  The mean mitral valve   gradient is 3.0 mmHg at HR 84 bpm. There is a prosthetic annuloplasty ring  present in the mitral position.   5. The aortic valve is normal in structure. Aortic valve regurgitation is  not visualized. No aortic stenosis is present.   6. There is mild dilatation of the ascending aorta, measuring 38 mm.   7. The inferior vena cava is normal in size with greater than 50%  respiratory variability, suggesting right atrial pressure of 3 mmHg.    EKG:  EKG is ordered today.  The ekg ordered today demonstrates sinus rhythm, first-degree AV block, occasional dropped heartbeat.  Recent Labs: 03/12/2023: ALT 20; BUN 20; Creatinine, Ser 0.97; Hemoglobin 14.0; Platelets 216.0; Potassium 4.2; Sodium 138; TSH 1.78  Recent Lipid Panel    Component Value Date/Time   CHOL 149 03/12/2023 1208   TRIG 114.0 03/12/2023 1208   HDL 52.50 03/12/2023 1208   CHOLHDL 3 03/12/2023 1208   VLDL 22.8 03/12/2023 1208   LDLCALC 74 03/12/2023 1208   LDLDIRECT 154.7 07/12/2013 0800     Risk Assessment/Calculations:           Physical Exam:    VS:  BP 134/72   Pulse 66   Ht 5\' 9"  (1.753 m)   Wt 208 lb 9.6 oz (94.6 kg)   SpO2 97%   BMI 30.80 kg/m         Wt Readings from Last 3 Encounters:  05/20/23 208 lb 9.6 oz (94.6 kg)  03/12/23 198 lb 12.8 oz (90.2 kg)  12/16/22 203 lb (92.1 kg)     GEN:  Well nourished, well developed in no acute distress HEENT: Normal NECK: No JVD; No carotid bruits LYMPHATICS: No lymphadenopathy CARDIAC: Irregular, no murmurs, rubs, gallops RESPIRATORY:  Clear to auscultation without rales, wheezing or rhonchi  ABDOMEN: Soft, non-tender, non-distended MUSCULOSKELETAL:  No edema; No deformity  SKIN: Warm and dry NEUROLOGIC:  Alert and oriented x 3 PSYCHIATRIC:  Normal affect   ASSESSMENT:    1. Irregular heart beat   2. Chest pain of uncertain etiology   3. Coronary artery disease involving native coronary artery of native heart with angina pectoris (HCC)   4. H/O  mitral valve repair   5. Hyperlipidemia LDL goal <70    PLAN:    In order of problems listed above:  Irregular heartbeat: Recently detected on DOT physical.  Previously had Mobitz 1 heart block noted on Holter monitor in 2018.  Patient does not have any signs and symptom of symptomatic bradycardia.  Will obtain 2-week ZIO monitor  Chest tightness: Obtain treadmill nuclear stress test.  No Lexiscan given history of Mobitz 1 heart block  CAD: Mild nonobstructive CAD noted prior to mitral valve repair in 2009.  Had a 50% ostial D1 lesion at the time.  See #2.  History of mitral valve repair: Stable on last echocardiogram in October 2023  Hyperlipidemia: On Crestor 20 mg daily.  Last lipid panel obtained in March 2024 demonstrated borderline LDL 74.  Will continue on current therapy.      Shared Decision Making/Informed Consent The risks [chest pain, shortness of breath, cardiac arrhythmias, dizziness, blood pressure fluctuations, myocardial infarction, stroke/transient ischemic attack, nausea, vomiting, allergic reaction, radiation exposure, metallic taste sensation and life-threatening complications (estimated to be 1 in 10,000)], benefits (risk stratification, diagnosing coronary artery disease, treatment guidance) and alternatives of a nuclear stress test were discussed in detail with Mr. Kuper and he agrees to proceed.  Medication Adjustments/Labs and Tests Ordered: Current medicines are reviewed at length with the patient today.  Concerns regarding medicines are outlined above.  Orders Placed This Encounter  Procedures   Cardiac Stress Test: Informed Consent Details: Physician/Practitioner Attestation; Transcribe to consent form and obtain patient signature   LONG TERM MONITOR (3-14 DAYS)   MYOCARDIAL PERFUSION IMAGING   EKG 12-Lead   No orders of the defined types were placed in this encounter.   Patient Instructions  Medication Instructions:   No changes   *If you need  a refill on your cardiac medications before your next appointment, please call your pharmacy*   Lab Work:  Not needed   Testing/Procedures: Will be schedule at [facility] [address]  Your doctor has scheduled you for a Myocardial Perfusion scan o to obtain information about the blood flow to your heart. The test consists of taking pictures of your heart in two phases: while resting and after a stress test.  The stress test wil involve walking on a treadmill.  The test will take approximately 3 to 4  hours to complete.   How to prepare for your test: Do not eat or drink 2 hours prior to your test Do not consume products containing caffeine 12 hours prior to your test (examples: coffee (regular OR decaf), chocolate, sodas, tea) Your doctor may need you to hold certain medications prior to the test.  If so, these are listed below and should not be taken for 24 hours prior to the test.  If not listed below, you may take your medications as normal.  You may resume taking held medications on your normal schedule once the test is complete.   Meds to hold: none Do bring a list of your current medications with you.  If you have held any meds in preparation for the test, please bring them, as you may be required to take them once the test is completed. Do wear comfortable clothes and walking shoes.  Do not wear dresses or overalls. Do NOT wear cologne, perfume, aftershave, or fragranced lotions the day of your test (deodorants okay). If these instructions are not followed your test will have to be rescheduled.   A nuclear cardiologist will review your test, prepare a report and send it to your physician.   If you have questions or concerns about your appointment, you can call the Nuclear Cardiology department at [phone]. If you cannot keep your appointment, please provide 48 hours notification to avoid a possible $50.00 charge to your account.   Please arrive 15 minutes prior to  your appointment time for registration and insurance purposes    Monitor will be mailed to your home in 3 to 7 days Your physician has recommended that you wear a holter monitor 14 day ZIO. Holter monitors are medical devices that record the heart's electrical activity. Doctors most often use these monitors to diagnose arrhythmias. Arrhythmias are problems with the speed or rhythm of the heartbeat. The monitor is a small, portable device. You can wear one while you do your normal daily activities. This is usually used to diagnose what is causing palpitations/syncope (passing out).    Follow-Up: At [facility], you and your health needs are our priority.  As part of our continuing mission to provide you with exceptional heart care, we have created designated Provider Care Teams.  These Care Teams include your primary Cardiologist (physician) and Advanced Practice Providers (APPs -  Physician Assistants and Nurse Practitioners)  who all work together to provide you with the care you need, when you need it.     Your next appointment:   12 month(s) if test are normal , if abnormality you will be seen earlier  The format for your next appointment:   In Person  Provider:   Rollene Rotunda, MD    Other Instructions   ZIO XT- Long Term Monitor Instructions  Your physician has requested you wear a ZIO patch monitor for 14 days.  This is a single patch monitor. [employer] supplies one patch monitor per enrollment. Additional stickers are not available. Please do not apply patch if you will be having a Nuclear Stress Test,  Echocardiogram, Cardiac CT, MRI, or Chest Xray during the period you would be wearing the  monitor. The patch cannot be worn during these tests. You cannot remove and re-apply the  ZIO XT patch monitor.  Your ZIO patch monitor will be mailed 3 day USPS to your address on file. It may take 3-5 days  to receive your monitor after you have been enrolled.  Once you have received  your monitor, please review the enclosed instructions. Your monitor  has already been registered assigning a specific monitor serial # to you.  Billing and Patient Assistance Program Information  We have supplied [employer] with any of your insurance information on file for billing purposes. [employer] offers a sliding scale Patient Assistance Program for patients that do not have  insurance, or whose insurance does not completely cover the cost of the ZIO monitor.  You must apply for the Patient Assistance Program to qualify for this discounted rate.  To apply, please call [employer] at [phone], select option 4, select option 2, ask to apply for  Patient Assistance Program. Meredeth Ide will ask your household income, and how many people  are in your household. They will quote your out-of-pocket cost based on that information.  [employer] will also be able to set up a 10-month, interest-free payment plan if needed.  Applying the monitor   Shave hair from upper left chest.  Hold abrader disc by orange tab. Rub abrader in 40 strokes over the upper left chest as  indicated in your monitor instructions.  Clean area with 4 enclosed alcohol pads. Let dry.  Apply patch as indicated in monitor instructions. Patch will be placed under collarbone on left  side of chest with arrow pointing upward.  Rub patch adhesive wings for 2 minutes. Remove white label marked "1". Remove the white  label marked "2". Rub patch adhesive wings for 2 additional minutes.  While looking in a mirror, press and release button in center of patch. A small green light will  flash 3-4 times. This will be your only indicator that the monitor has been turned on.  Do not shower for the first 24 hours. You may shower after the first 24 hours.  Press the button if you feel a symptom. You will hear a small click. Record Date, Time and  Symptom in the Patient Logbook.  When you are ready to remove the patch, follow instructions on the last  2 pages of Patient  Logbook. Stick patch monitor onto the last page of Patient Logbook.  Place Patient Logbook in the blue and white box. Use locking tab on box and tape box closed  securely. The blue and white box has prepaid postage on it. Please place it in the mailbox as  soon as possible. Your physician should have your test results approximately  7 days after the  monitor has been mailed back to [place].  Call [facility] Customer Care at [phone] if you have questions regarding  your ZIO XT patch monitor. Call them immediately if you see an orange light blinking on your  monitor.  If your monitor falls off in less than 4 days, contact our Monitor department at [phone].  If your monitor becomes loose or falls off after 4 days call [employer] at [phone] for  suggestions on securing your monitor    Signed, Azalee Course, [place]  05/20/2023 10:01 AM    [facility]

## 2023-05-20 NOTE — Patient Instructions (Signed)
Medication Instructions:   No changes   *If you need a refill on your cardiac medications before your next appointment, please call your pharmacy*   Lab Work:  Not needed   Testing/Procedures: Will be schedule at [facility] [address] [address]  Your doctor has scheduled you for a Myocardial Perfusion scan o to obtain information about the blood flow to your heart. The test consists of taking pictures of your heart in two phases: while resting and after a stress test.  The stress test wil involve walking on a treadmill.  The test will take approximately 3 to 4  hours to complete.   How to prepare for your test: Do not eat or drink 2 hours prior to your test Do not consume products containing caffeine 12 hours prior to your test (examples: coffee (regular OR decaf), chocolate, sodas, tea) Your doctor may need you to hold certain medications prior to the test.  If so, these are listed below and should not be taken for 24 hours prior to the test.  If not listed below, you may take your medications as normal.  You may resume taking held medications on your normal schedule once the test is complete.   Meds to hold: none Do bring a list of your current medications with you.  If you have held any meds in preparation for the test, please bring them, as you may be required to take them once the test is completed. Do wear comfortable clothes and walking shoes.  Do not wear dresses or overalls. Do NOT wear cologne, perfume, aftershave, or fragranced lotions the day of your test (deodorants okay). If these instructions are not followed your test will have to be rescheduled.   A nuclear cardiologist will review your test, prepare a report and send it to your physician.   If you have questions or concerns about your appointment, you can call the Nuclear Cardiology department at [phone]. If you cannot keep your appointment, please provide 48 hours notification to avoid a possible $50.00  charge to your account.   Please arrive 15 minutes prior to your appointment time for registration and insurance purposes    Monitor will be mailed to your home in 3 to 7 days Your physician has recommended that you wear a holter monitor 14 day ZIO. Holter monitors are medical devices that record the heart's electrical activity. Doctors most often use these monitors to diagnose arrhythmias. Arrhythmias are problems with the speed or rhythm of the heartbeat. The monitor is a small, portable device. You can wear one while you do your normal daily activities. This is usually used to diagnose what is causing palpitations/syncope (passing out).    Follow-Up: At [facility], you and your health needs are our priority.  As part of our continuing mission to provide you with exceptional heart care, we have created designated Provider Care Teams.  These Care Teams include your primary Cardiologist (physician) and Advanced Practice Providers (APPs -  Physician Assistants and Nurse Practitioners) who all work together to provide you with the care you need, when you need it.     Your next appointment:   12 month(s) if test are normal , if abnormality you will be seen earlier  The format for your next appointment:   In Person  Provider:   Rollene Rotunda, MD    Other Instructions   ZIO XT- Long Term Monitor Instructions  Your physician has requested you wear a ZIO patch monitor for  14 days.  This is a single patch monitor. [employer] supplies one patch monitor per enrollment. Additional stickers are not available. Please do not apply patch if you will be having a Nuclear Stress Test,  Echocardiogram, Cardiac CT, MRI, or Chest Xray during the period you would be wearing the  monitor. The patch cannot be worn during these tests. You cannot remove and re-apply the  ZIO XT patch monitor.  Your ZIO patch monitor will be mailed 3 day USPS to your address on file. It may take 3-5 days  to receive your  monitor after you have been enrolled.  Once you have received your monitor, please review the enclosed instructions. Your monitor  has already been registered assigning a specific monitor serial # to you.  Billing and Patient Assistance Program Information  We have supplied [employer] with any of your insurance information on file for billing purposes. [employer] offers a sliding scale Patient Assistance Program for patients that do not have  insurance, or whose insurance does not completely cover the cost of the ZIO monitor.  You must apply for the Patient Assistance Program to qualify for this discounted rate.  To apply, please call [employer] at [phone], select option 4, select option 2, ask to apply for  Patient Assistance Program. Meredeth Ide will ask your household income, and how many people  are in your household. They will quote your out-of-pocket cost based on that information.  [employer] will also be able to set up a 29-month, interest-free payment plan if needed.  Applying the monitor   Shave hair from upper left chest.  Hold abrader disc by orange tab. Rub abrader in 40 strokes over the upper left chest as  indicated in your monitor instructions.  Clean area with 4 enclosed alcohol pads. Let dry.  Apply patch as indicated in monitor instructions. Patch will be placed under collarbone on left  side of chest with arrow pointing upward.  Rub patch adhesive wings for 2 minutes. Remove white label marked "1". Remove the white  label marked "2". Rub patch adhesive wings for 2 additional minutes.  While looking in a mirror, press and release button in center of patch. A small green light will  flash 3-4 times. This will be your only indicator that the monitor has been turned on.  Do not shower for the first 24 hours. You may shower after the first 24 hours.  Press the button if you feel a symptom. You will hear a small click. Record Date, Time and  Symptom in the Patient Logbook.  When you  are ready to remove the patch, follow instructions on the last 2 pages of Patient  Logbook. Stick patch monitor onto the last page of Patient Logbook.  Place Patient Logbook in the blue and white box. Use locking tab on box and tape box closed  securely. The blue and white box has prepaid postage on it. Please place it in the mailbox as  soon as possible. Your physician should have your test results approximately 7 days after the  monitor has been mailed back to [facility].  Call [facility] Customer Care at [phone] if you have questions regarding  your ZIO XT patch monitor. Call them immediately if you see an orange light blinking on your  monitor.  If your monitor falls off in less than 4 days, contact our Monitor department at [phone].  If your monitor becomes loose or falls off after 4 days call [employer] at [phone] for  suggestions on  securing your monitor

## 2023-05-20 NOTE — Progress Notes (Unsigned)
Enrolled patient for a 14 day Zio XT monitor to be mailed to patients home  [place] to read

## 2023-05-20 NOTE — Telephone Encounter (Signed)
Left message on voicemail per DPR in reference to upcoming appointment scheduled on 05/25/2023 at 7:45 with detailed instructions given per Myocardial Perfusion Study Information Sheet for the test. LM to arrive 15 minutes early, and that it is imperative to arrive on time for appointment to keep from having the test rescheduled. If you need to cancel or reschedule your appointment, please call the office within 24 hours of your appointment. Failure to do so may result in a cancellation of your appointment, and a $50 no show fee. Phone number given for call back for any questions.

## 2023-05-25 ENCOUNTER — Ambulatory Visit (HOSPITAL_COMMUNITY): Payer: PPO | Attending: Internal Medicine

## 2023-05-25 DIAGNOSIS — I499 Cardiac arrhythmia, unspecified: Secondary | ICD-10-CM | POA: Diagnosis not present

## 2023-05-25 DIAGNOSIS — I25119 Atherosclerotic heart disease of native coronary artery with unspecified angina pectoris: Secondary | ICD-10-CM | POA: Diagnosis not present

## 2023-05-25 DIAGNOSIS — R079 Chest pain, unspecified: Secondary | ICD-10-CM | POA: Diagnosis not present

## 2023-05-25 LAB — MYOCARDIAL PERFUSION IMAGING
Angina Index: 0
Duke Treadmill Score: 8
Estimated workload: 10.1
Exercise duration (min): 8 min
LV dias vol: 88 mL (ref 62–150)
LV sys vol: 40 mL
MPHR: 154 {beats}/min
Nuc Stress EF: 54 %
Peak HR: 139 {beats}/min
Percent HR: 90 %
Rest HR: 60 {beats}/min
Rest Nuclear Isotope Dose: 10.1 mCi
SDS: 0
SRS: 0
SSS: 0
ST Depression (mm): 0 mm
Stress Nuclear Isotope Dose: 32.8 mCi
TID: 1.03

## 2023-05-25 MED ORDER — REGADENOSON 0.4 MG/5ML IV SOLN: 0.4000 mg | Freq: Once | INTRAVENOUS | Status: AC

## 2023-05-25 MED ORDER — TECHNETIUM TC 99M TETROFOSMIN IV KIT
32.8000 | PACK | Freq: Once | INTRAVENOUS | Status: AC | PRN
  Administered 2023-05-25: 32.8 via INTRAVENOUS

## 2023-05-25 MED ORDER — TECHNETIUM TC 99M TETROFOSMIN IV KIT
10.1000 | PACK | Freq: Once | INTRAVENOUS | Status: AC | PRN
  Administered 2023-05-25: 10.1 via INTRAVENOUS

## 2023-06-15 DIAGNOSIS — I499 Cardiac arrhythmia, unspecified: Secondary | ICD-10-CM | POA: Diagnosis not present

## 2023-06-15 DIAGNOSIS — I25119 Atherosclerotic heart disease of native coronary artery with unspecified angina pectoris: Secondary | ICD-10-CM | POA: Diagnosis not present

## 2023-06-30 DIAGNOSIS — L821 Other seborrheic keratosis: Secondary | ICD-10-CM | POA: Diagnosis not present

## 2023-08-10 ENCOUNTER — Telehealth: Payer: Self-pay | Admitting: Internal Medicine

## 2023-08-10 NOTE — Telephone Encounter (Signed)
Pt called to inform MD that he has "the beginnings of a cold" and wanted to know if MD could send him an Rx?  Pt was asked what are his symptoms?  Pt stated some "sinus stuff", some cough and headaches. Denied congestion, body aches, fever and chills.   Pt was offered an OV, to be tested.   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   Pt was told his message would be sent to MD and her team.

## 2023-08-11 ENCOUNTER — Ambulatory Visit
Admission: EM | Admit: 2023-08-11 | Discharge: 2023-08-11 | Disposition: A | Discharge status: Home / Self Care | Payer: PPO | Attending: Internal Medicine | Admitting: Internal Medicine

## 2023-08-11 ENCOUNTER — Encounter: Payer: Self-pay | Admitting: *Deleted

## 2023-08-11 ENCOUNTER — Other Ambulatory Visit: Payer: Self-pay

## 2023-08-11 DIAGNOSIS — Z1152 Encounter for screening for COVID-19: Secondary | ICD-10-CM | POA: Insufficient documentation

## 2023-08-11 DIAGNOSIS — R051 Acute cough: Secondary | ICD-10-CM | POA: Insufficient documentation

## 2023-08-11 DIAGNOSIS — R1013 Epigastric pain: Secondary | ICD-10-CM | POA: Insufficient documentation

## 2023-08-11 DIAGNOSIS — B349 Viral infection, unspecified: Secondary | ICD-10-CM | POA: Insufficient documentation

## 2023-08-11 MED ORDER — BENZONATATE 100 MG PO CAPS: 100.0000 mg | ORAL_CAPSULE | Freq: Three times a day (TID) | ORAL | 0 refills | Status: DC | PRN

## 2023-08-11 NOTE — ED Provider Notes (Signed)
[facility]    CSN: [id number] Arrival date & time: 08/11/23  1836      History   Chief Complaint Chief Complaint  Patient presents with   Cough    HPI Jeremy Wolf is a 67 y.o. male.   Patient presents with a productive cough with clear sputum that started about 4 days ago.  Denies any current upper respiratory symptoms, fever.  Denies any known sick contacts.  Denies chest pain or shortness of breath.  Denies history of asthma or COPD and patient does not smoke cigarettes.  Patient reports that he is taking Alka-Seltzer cold and flu with some improvement in symptoms.  Patient would also like to inquire about epigastric pain that occurs only when he eats.  Reports it has been present for weeks to months.  Patient is not reporting any current abdominal pain, nausea, vomiting, diarrhea, fever.   Cough   Past Medical History:  Diagnosis Date   Allergic rhinitis    Allergy    Atrial fibrillation (HCC) 02/10/2009   Centricity Description: FIBRILLATION, ATRIAL Qualifier: Diagnosis of  By: Antoine Poche, MD, Gerrit Heck   Centricity Description: ATRIAL FIBRILLATION Qualifier: Diagnosis of  By: Clent Ridges MD, Tera Mater    Blood transfusion without reported diagnosis 2010   after heart surgery   CAD (coronary artery disease)    a. cath 12/09: oD1 50%, EF 65%;  b. negative ETT 7/11   Cataract    removed left eye with lens implant    Constipation    uses Miralax   History of atrial fibrillation    post op AFib   Hx of colonic polyps    Hyperlipidemia    Mitral valve prolapse    a. with severe regurgitation; s/p MV repair 2/10;  b. 07/2011 Echo: EF 50-55%, mild MR.   Nephrolithiasis    hx of   Patent foramen ovale    a. s/p closure at MV repair in 2010   Plantar fasciitis of left foot 07/06/2012   Premature birth ?    twin  was in hosp 30 days feeding lazy eye small weight    Rheumatic fever with heart involvement    MVP per pt due to this with repair 2010   Sleep apnea     no cpap    Patient Active Problem List   Diagnosis Date Noted   Family history of mitral valve repair 03/19/2022   Coronary artery disease involving native coronary artery of native heart without angina pectoris 03/19/2022   Erectile dysfunction 03/19/2022   Leg swelling 03/19/2022   Hyperlipidemia    Constipation 07/01/2018   AV block, Mobitz 1 09/07/2017   Bradycardia 05/08/2017   Visit for preventive health examination 07/24/2014   Hx of colonic polyps    Visual disturbances 07/19/2013   Transient visual disturbance, bilateral 07/19/2013   Plantar fasciitis of left foot 07/06/2012   S/P mitral valve repair 07/10/2011   Preventative health care 07/09/2011   Mitral valve prolapse    Nephrolithiasis    OBSTRUCTIVE SLEEP APNEA 12/13/2009   HYPERSOMNIA 10/24/2009   TESTICULAR HYPOFUNCTION 10/14/2009   SNORING 10/12/2009   LIBIDO, DECREASED 10/12/2009   WEIGHT GAIN, ABNORMAL 04/25/2009   PATENT FORAMEN OVALE 02/10/2009   HEARTBURN 11/06/2008   HYPERLIPIDEMIA 03/06/2008   ERECTILE DYSFUNCTION 03/06/2008   MITRAL VALVE PROLAPSE 03/06/2008   Allergic rhinitis 03/06/2008   FATIGUE 03/06/2008   HEADACHE 03/06/2008   CARDIAC MURMUR 03/06/2008   CHEST PAIN 03/06/2008   NEPHROLITHIASIS, HX OF  03/06/2008    Past Surgical History:  Procedure Laterality Date   COLONOSCOPY     HERNIA REPAIR Right 2012   Inguinal 3 years ago   PATENT FORAMEN OVALE CLOSURE  2010   POLYPECTOMY     SPINE SURGERY  12/09/2010   vertebra 5 & 6   THORACOTOMY  2010   Rt miniature for mitral valve repair, quadrangular resection of posterior leaftlet with transposition of  native chordae tendineae x 2 and 34mm Edwards Physio II ring annuloplasty       Home Medications    Prior to Admission medications   Medication Sig Start Date End Date Taking? Authorizing Provider  aspirin EC 81 MG tablet Take 81 mg by mouth daily.   Yes [provider]  benzonatate (TESSALON) 100 MG capsule Take 1  capsule (100 mg total) by mouth every 8 (eight) hours as needed for cough. 08/11/23  Yes [name], [name], FNP  rosuvastatin (CRESTOR) 20 MG tablet TAKE 1 TABLET BY MOUTH EVERY DAY 08/21/22  Yes Rollene Rotunda, MD  Misc Natural Products (RED WINE EXTRACT) CAPS Take by mouth.    [provider]  triamcinolone ointment (KENALOG) 0.1 % Apply 1 Application topically 2 (two) times daily. 03/12/23   Panosh, Neta Mends, MD    Family History Family History  Problem Relation Age of Onset   Diabetes Mother    Hyperlipidemia Mother    Hyperlipidemia Father    Heart disease Father        heart surgery 75 ? cabd and valve   Cancer Daughter 25       ovarian   Lung cancer Maternal Grandfather        lung   Emphysema Maternal Grandfather    Arthritis Paternal Grandmother    Arthritis Paternal Grandfather    Rheumatic fever Unknown        2 sisters   Other Sister        twin hx of substance use   Colon cancer Neg Hx    Colon polyps Neg Hx    Esophageal cancer Neg Hx    Rectal cancer Neg Hx    Stomach cancer Neg Hx     Social History Social History   Tobacco Use   Smoking status: Never   Smokeless tobacco: Never  Vaping Use   Vaping status: Never Used  Substance Use Topics   Alcohol use: Yes    Alcohol/week: 0.0 standard drinks of alcohol    Comment: OCC. WINE   Drug use: No     Allergies   Patient has no known allergies.   Review of Systems Review of Systems Per HPI  Physical Exam Triage Vital Signs ED Triage Vitals  Encounter Vitals Group     BP 08/11/23 1843 134/80     Systolic BP Percentile --      Diastolic BP Percentile --      Pulse Rate 08/11/23 1843 93     Resp 08/11/23 1843 18     Temp 08/11/23 1843 97.9 F (36.6 C)     Temp Source 08/11/23 1843 Oral     SpO2 08/11/23 1843 98 %     Weight --      Height --      Head Circumference --      Peak Flow --      Pain Score 08/11/23 1844 0     Pain Loc --      Pain Education --      Exclude  from Growth  Chart --    No data found.  Updated Vital Signs BP 134/80 (BP Location: Right Arm)   Pulse 93   Temp 97.9 F (36.6 C) (Oral)   Resp 18   SpO2 98%   Visual Acuity Right Eye Distance:   Left Eye Distance:   Bilateral Distance:    Right Eye Near:   Left Eye Near:    Bilateral Near:     Physical Exam Constitutional:      General: He is not in acute distress.    Appearance: Normal appearance. He is not toxic-appearing or diaphoretic.  HENT:     Head: Normocephalic and atraumatic.     Right Ear: Tympanic membrane and ear canal normal.     Left Ear: Tympanic membrane and ear canal normal.     Nose: Congestion present.     Mouth/Throat:     Mouth: Mucous membranes are moist.     Pharynx: No posterior oropharyngeal erythema.  Eyes:     Extraocular Movements: Extraocular movements intact.     Conjunctiva/sclera: Conjunctivae normal.     Pupils: Pupils are equal, round, and reactive to light.  Cardiovascular:     Rate and Rhythm: Normal rate and regular rhythm.     Pulses: Normal pulses.     Heart sounds: Normal heart sounds.  Pulmonary:     Effort: Pulmonary effort is normal. No respiratory distress.     Breath sounds: Normal breath sounds. No stridor. No wheezing, rhonchi or rales.  Abdominal:     General: Abdomen is flat. Bowel sounds are normal. There is no distension.     Palpations: Abdomen is soft.     Tenderness: There is no abdominal tenderness.  Musculoskeletal:        General: Normal range of motion.     Cervical back: Normal range of motion.  Skin:    General: Skin is warm and dry.  Neurological:     General: No focal deficit present.     Mental Status: He is alert and oriented to person, place, and time. Mental status is at baseline.  Psychiatric:        Mood and Affect: Mood normal.        Behavior: Behavior normal.      UC Treatments / Results  Labs (all labs ordered are listed, but only abnormal results are displayed) Labs Reviewed  SARS  CORONAVIRUS 2 (TAT 6-24 HRS)    EKG   Radiology No results found.  Procedures Procedures (including critical care time)  Medications Ordered in UC Medications - No data to display  Initial Impression / Assessment and Plan / UC Course  I have reviewed the triage vital signs and the nursing notes.  Pertinent labs & imaging results that were available during my care of the patient were reviewed by me and considered in my medical decision making (see chart for details).     Viral illness  Suspect cough is due to viral illness.  There are no obvious adventitious lung sounds on exam so do not think that chest imaging is necessary.  COVID test pending.  Benzonatate prescribed to take as needed for cough.  Advised to follow-up if cough persists or worsens.  2.  Epigastric pain  There are no obvious signs of acute abdomen on exam that would need emergent evaluation or imaging.  Suspect given that this occurs only when eating that patient may have some form of GERD.  Advised over-the-counter omeprazole or Pepcid as  needed and follow-up if it persists or worsens.  Also advised strict ER precautions.  Patient verbalized understanding and was agreeable with plan. Final Clinical Impressions(s) / UC Diagnoses   Final diagnoses:  Viral illness  Acute cough  Abdominal pain, epigastric     Discharge Instructions      Covid test is pending.  We will call if it is positive.  I have sent you cough medication to take as needed.    ED Prescriptions     Medication Sig Dispense Auth. Provider   benzonatate (TESSALON) 100 MG capsule Take 1 capsule (100 mg total) by mouth every 8 (eight) hours as needed for cough. 21 capsule Dawson, Acie Fredrickson, [place]      PDMP not reviewed this encounter.   Gustavus Bryant, [place] 08/11/23 [phone]

## 2023-08-11 NOTE — Telephone Encounter (Signed)
Spoke to pt to follow up.   Inform pt with his concerns we would need a visit, it can be virtually. Pt declined and he said to forget about it and that he will see with urgent care.

## 2023-08-11 NOTE — ED Triage Notes (Signed)
Cough x 4 days- states going out of town so wanted to get checked out

## 2023-08-11 NOTE — Discharge Instructions (Signed)
Covid test is pending.  We will call if it is positive.  I have sent you cough medication to take as needed.

## 2023-08-12 LAB — SARS CORONAVIRUS 2 (TAT 6-24 HRS): SARS Coronavirus 2: NEGATIVE

## 2023-08-13 ENCOUNTER — Encounter: Payer: Self-pay | Admitting: Internal Medicine

## 2023-08-30 ENCOUNTER — Other Ambulatory Visit: Payer: Self-pay | Admitting: Cardiology

## 2023-11-09 ENCOUNTER — Encounter: Payer: Self-pay | Admitting: Gastroenterology

## 2023-11-30 ENCOUNTER — Other Ambulatory Visit: Payer: Self-pay | Admitting: Adult Health

## 2023-11-30 DIAGNOSIS — J22 Unspecified acute lower respiratory infection: Secondary | ICD-10-CM

## 2023-12-22 ENCOUNTER — Ambulatory Visit (INDEPENDENT_AMBULATORY_CARE_PROVIDER_SITE_OTHER): Payer: PPO

## 2023-12-22 VITALS — Ht 69.0 in | Wt 208.0 lb

## 2023-12-22 DIAGNOSIS — Z0000 Encounter for general adult medical examination without abnormal findings: Secondary | ICD-10-CM

## 2023-12-22 DIAGNOSIS — Z1211 Encounter for screening for malignant neoplasm of colon: Secondary | ICD-10-CM

## 2023-12-22 NOTE — Progress Notes (Signed)
 Subjective:   Jeremy Wolf is a 67 y.o. male who presents for Medicare Annual/Subsequent preventive examination.  Visit Complete: Virtual I connected with  Jeremy Wolf on 12/22/23 by a audio enabled telemedicine application and verified that I am speaking with the correct person using two identifiers.  Patient Location: Home  Provider Location: Home Office  I discussed the limitations of evaluation and management by telemedicine. The patient expressed understanding and agreed to proceed.  Vital Signs: Because this visit was a virtual/telehealth visit, some criteria may be missing or patient reported. Any vitals not documented were not able to be obtained and vitals that have been documented are patient reported.    Cardiac Risk Factors include: advanced age (>26men, >77 women);male gender     Objective:    Today's Vitals   12/22/23 0843  Weight: 208 lb (94.3 kg)  Height: 5' 9 (1.753 m)   Body mass index is 30.72 kg/m.     12/22/2023    8:49 AM 12/16/2022    3:57 PM 09/28/2017    9:16 AM 10/23/2014    8:28 AM  Advanced Directives  Does Patient Have a Medical Advance Directive? No Yes No No  Type of Special Educational Needs Teacher of [place];Living will    Copy of Healthcare Power of Attorney in Chart?  No - copy requested    Would patient like information on creating a medical advance directive? No - Patient declined  No - Patient declined     Current Medications (verified) Outpatient Encounter Medications as of 12/22/2023  Medication Sig   aspirin EC 81 MG tablet Take 81 mg by mouth daily.   benzonatate  (TESSALON ) 100 MG capsule Take 1 capsule (100 mg total) by mouth every 8 (eight) hours as needed for cough.   Misc Natural Products (RED WINE EXTRACT) CAPS Take by mouth.   rosuvastatin  (CRESTOR ) 20 MG tablet TAKE 1 TABLET BY MOUTH EVERY DAY   triamcinolone  ointment (KENALOG ) 0.1 % Apply 1 Application topically 2 (two) times daily.   Facility-Administered  Encounter Medications as of 12/22/2023  Medication   regadenoson  (LEXISCAN ) injection SOLN 0.4 mg    Allergies (verified) Patient has no known allergies.   History: Past Medical History:  Diagnosis Date   Allergic rhinitis    Allergy    Atrial fibrillation (HCC) 02/10/2009   Centricity Description: FIBRILLATION, ATRIAL Qualifier: Diagnosis of  By: Lavona, MD, CODY Jeremy   Centricity Description: ATRIAL FIBRILLATION Qualifier: Diagnosis of  By: Johnny MD, Garnette LABOR    Blood transfusion without reported diagnosis 2010   after heart surgery   CAD (coronary artery disease)    a. cath 12/09: oD1 50%, EF 65%;  b. negative ETT 7/11   Cataract    removed left eye with lens implant    Constipation    uses Miralax   History of atrial fibrillation    post op AFib   Hx of colonic polyps    Hyperlipidemia    Mitral valve prolapse    a. with severe regurgitation; s/p MV repair 2/10;  b. 07/2011 Echo: EF 50-55%, mild MR.   Nephrolithiasis    hx of   Patent foramen ovale    a. s/p closure at MV repair in 2010   Plantar fasciitis of left foot 07/06/2012   Premature birth ?    twin  was in hosp 30 days feeding lazy eye small weight    Rheumatic fever with heart involvement    MVP per pt due to  this with repair 2010   Sleep apnea    no cpap   Past Surgical History:  Procedure Laterality Date   COLONOSCOPY     HERNIA REPAIR Right 2012   Inguinal 3 years ago   PATENT FORAMEN OVALE CLOSURE  2010   POLYPECTOMY     SPINE SURGERY  12/09/2010   vertebra 5 & 6   THORACOTOMY  2010   Rt miniature for mitral valve repair, quadrangular resection of posterior leaftlet with transposition of  native chordae tendineae x 2 and 34mm Edwards Physio II ring annuloplasty   Family History  Problem Relation Age of Onset   Diabetes Mother    Hyperlipidemia Mother    Hyperlipidemia Father    Heart disease Father        heart surgery 3 ? cabd and valve   Cancer Daughter 77       ovarian   Lung cancer  Maternal Grandfather        lung   Emphysema Maternal Grandfather    Arthritis Paternal Grandmother    Arthritis Paternal Grandfather    Rheumatic fever Unknown        2 sisters   Other Sister        twin hx of substance use   Colon cancer Neg Hx    Colon polyps Neg Hx    Esophageal cancer Neg Hx    Rectal cancer Neg Hx    Stomach cancer Neg Hx    Social History   Socioeconomic History   Marital status: Married    Spouse name: Not on file   Number of children: 5   Years of education: Not on file   Highest education level: Not on file  Occupational History   Occupation: Truck Hospital Doctor  Tobacco Use   Smoking status: Never   Smokeless tobacco: Never  Vaping Use   Vaping status: Never Used  Substance and Sexual Activity   Alcohol use: Yes    Alcohol/week: 0.0 standard drinks of alcohol    Comment: OCC. 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    Sleeps; 7- 8 hours   Married (second marriage) 4 children   Regular exercise- no    Married summer 2007   caffeine 10-12 cups coffee   HH of 2   No pets   Originally from the [place]   Was a twin birth midly  Premature  Fraternal  Twin sister    Social Drivers of Corporate Investment Banker Strain: Low Risk  (12/22/2023)   Overall Financial Resource Strain (CARDIA)    Difficulty of Paying Living Expenses: Not hard at all  Food Insecurity: No Food Insecurity (12/22/2023)   Hunger Vital Sign    Worried About Running Out of Food in the Last Year: Never true    Ran Out of Food in the Last Year: Never true  Transportation Needs: No Transportation Needs (12/22/2023)   PRAPARE - Administrator, Civil Service (Medical): No    Lack of Transportation (Non-Medical): No  Physical Activity: Inactive (12/22/2023)   Exercise Vital  Sign    Days of Exercise per Week: 0 days    Minutes of Exercise per Session: 0 min  Stress: No Stress  Concern Present (12/22/2023)   [employer] of Occupational Health - Occupational Stress Questionnaire    Feeling of Stress : Not at all  Social Connections: Socially Integrated (12/22/2023)   Social Connection and Isolation Panel [NHANES]    Frequency of Communication with Friends and Family: More than three times a week    Frequency of Social Gatherings with Friends and Family: More than three times a week    Attends Religious Services: More than 4 times per year    Active Member of [employer] or Organizations: Yes    Attends Engineer, Structural: More than 4 times per year    Marital Status: Married    Tobacco Counseling Counseling given: Not Answered   Clinical Intake:  Pre-visit preparation completed: Yes  Pain : No/denies pain     BMI - recorded: 30.72 Nutritional Status: BMI > 30  Obese Nutritional Risks: None Diabetes: No  How often do you need to have someone help you when you read instructions, pamphlets, or other written materials from your doctor or pharmacy?: 1 - Never  Interpreter Needed?: No  Information entered by :: Rojelio Blush LPN   Activities of Daily Living    12/22/2023    8:47 AM  In your present state of health, do you have any difficulty performing the following activities:  Hearing? 0  Vision? 0  Difficulty concentrating or making decisions? 0  Walking or climbing stairs? 0  Dressing or bathing? 0  Doing errands, shopping? 0  Preparing Food and eating ? N  Using the Toilet? N  In the past six months, have you accidently leaked urine? N  Do you have problems with loss of bowel control? N  Managing your Medications? N  Managing your Finances? N  Housekeeping or managing your Housekeeping? N    Patient Care Team: Panosh, Apolinar POUR, MD as PCP - General Lavona Agent, MD as PCP - Cardiology (Cardiology) Lavona Agent,  MD (Cardiology) Mavis Purchase, MD (Neurosurgery) Legrand Victory LITTIE MOULD, MD as Consulting Physician (Gastroenterology) Joshua Sieving, MD as Consulting Physician (Dermatology)  Indicate any recent Medical Services you may have received from other than [facility] providers in the past year (date may be approximate).     Assessment:   This is a routine wellness examination for Peter.  Hearing/Vision screen Hearing Screening - Comments:: Denies hearing difficulties   Vision Screening - Comments:: Wears rx glasses - up to date with routine eye exams with  Dr Vannie   Goals Addressed               This Visit's Progress     Complete personal projects (pt-stated)         Depression Screen    12/22/2023    8:47 AM 03/12/2023   11:38 AM 12/16/2022    3:53 PM 12/18/2021    9:45 AM 12/17/2020    8:38 AM 11/16/2019    1:44 PM 10/19/2018   10:19 AM  PHQ 2/9 Scores  PHQ - 2 Score 0 0 0 0 1 0 1  PHQ- 9 Score  6  2       Fall Risk    12/22/2023    8:48 AM 03/12/2023   11:31 AM 12/16/2022    3:55 PM 12/18/2021    9:44 AM 12/17/2020    8:38 AM  Fall Risk   Falls in the past year? 0 0 0 0 0  Number falls in past yr: 0 0 0    Injury with Fall?  0 0 0  0  Risk for fall due to : No Fall Risks No Fall Risks No Fall Risks    Follow up Falls prevention discussed Falls evaluation completed Falls prevention discussed  Falls evaluation completed    MEDICARE RISK AT HOME: Medicare Risk at Home Any stairs in or around the home?: Yes If so, are there any without handrails?: No Home free of loose throw rugs in walkways, pet beds, electrical cords, etc?: Yes Adequate lighting in your home to reduce risk of falls?: Yes Life alert?: No Use of a cane, walker or w/c?: No Grab bars in the bathroom?: No Shower chair or bench in shower?: No Elevated toilet seat or a handicapped toilet?: No  TIMED UP AND GO:  Was the test performed?  No    Cognitive Function:        12/22/2023    8:49 AM  12/16/2022    3:57 PM  6CIT Screen  What Year? 0 points 0 points  What month? 0 points 0 points  What time? 0 points 0 points  Count back from 20 0 points 0 points  Months in reverse 4 points 2 points  Repeat phrase 0 points 0 points  Total Score 4 points 2 points    Immunizations Immunization History  Administered Date(s) Administered   Fluad Quad(high Dose 65+) 12/18/2021   Influenza Whole 10/12/2009   Influenza,inj,Quad PF,6+ Mos 11/08/2013, 09/24/2016, 09/28/2017, 10/19/2018, 11/16/2019, 12/17/2020   Td 12/22/2002   Tdap 07/19/2013    TDAP status: Due, Education has been provided regarding the importance of this vaccine. Advised may receive this vaccine at local pharmacy or Health Dept. Aware to provide a copy of the vaccination record if obtained from local pharmacy or Health Dept. Verbalized acceptance and understanding.  Flu Vaccine status: Declined, Education has been provided regarding the importance of this vaccine but patient still declined. Advised may receive this vaccine at local pharmacy or Health Dept. Aware to provide a copy of the vaccination record if obtained from local pharmacy or Health Dept. Verbalized acceptance and understanding.  Pneumococcal vaccine status: Due, Education has been provided regarding the importance of this vaccine. Advised may receive this vaccine at local pharmacy or Health Dept. Aware to provide a copy of the vaccination record if obtained from local pharmacy or Health Dept. Verbalized acceptance and understanding.  Covid-19 vaccine status: Declined, Education has been provided regarding the importance of this vaccine but patient still declined. Advised may receive this vaccine at local pharmacy or Health Dept.or vaccine clinic. Aware to provide a copy of the vaccination record if obtained from local pharmacy or Health Dept. Verbalized acceptance and understanding.  Qualifies for Shingles Vaccine? Yes   Zostavax completed No   Shingrix  Completed?: No.    Education has been provided regarding the importance of this vaccine. Patient has been advised to call insurance company to determine out of pocket expense if they have not yet received this vaccine. Advised may also receive vaccine at local pharmacy or Health Dept. Verbalized acceptance and understanding.  Screening Tests Health Maintenance  Topic Date Due   COVID-19 Vaccine (1) Never done   Pneumonia Vaccine 51+ Years old (1 of 2 - PCV) Never done   Zoster Vaccines- Shingrix (1 of 2) Never done   DTaP/Tdap/Td (3 - Td or Tdap) 07/20/2023   INFLUENZA VACCINE  07/23/2023   Colonoscopy  10/16/2023   Medicare Annual Wellness (AWV)  12/21/2024   Hepatitis C Screening  Completed   HPV  VACCINES  Aged Out    Health Maintenance  Health Maintenance Due  Topic Date Due   COVID-19 Vaccine (1) Never done   Pneumonia Vaccine 75+ Years old (1 of 2 - PCV) Never done   Zoster Vaccines- Shingrix (1 of 2) Never done   DTaP/Tdap/Td (3 - Td or Tdap) 07/20/2023   INFLUENZA VACCINE  07/23/2023   Colonoscopy  10/16/2023    Colorectal cancer screening: Referral to GI placed 12/22/23. Pt aware the office will call re: appt.    Additional Screening:  Hepatitis C Screening: does qualify; Completed 09/17/16  Vision Screening: Recommended annual ophthalmology exams for early detection of glaucoma and other disorders of the eye. Is the patient up to date with their annual eye exam?  Yes  Who is the provider or what is the name of the office in which the patient attends annual eye exams? Dr Vannie If pt is not established with a provider, would they like to be referred to a provider to establish care? No .   Dental Screening: Recommended annual dental exams for proper oral hygiene    Community Resource Referral / Chronic Care Management:  CRR required this visit?  No   CCM required this visit?  No     Plan:     I have personally reviewed and noted the following in the  patient's chart:   Medical and social history Use of alcohol, tobacco or illicit drugs  Current medications and supplements including opioid prescriptions. Patient is not currently taking opioid prescriptions. Functional ability and status Nutritional status Physical activity Advanced directives List of other physicians Hospitalizations, surgeries, and ER visits in previous 12 months Vitals Screenings to include cognitive, depression, and falls Referrals and appointments  In addition, I have reviewed and discussed with patient certain preventive protocols, quality metrics, and best practice recommendations. A written personalized care plan for preventive services as well as general preventive health recommendations were provided to patient.     Rojelio LELON Blush, LPN   [id number]   After Visit Summary: (MyChart) Due to this being a telephonic visit, the after visit summary with patients personalized plan was offered to patient via MyChart   Nurse Notes: None

## 2023-12-22 NOTE — Patient Instructions (Addendum)
 Mr. Kundinger , Thank you for taking time to come for your Medicare Wellness Visit. I appreciate your ongoing commitment to your health goals. Please review the following plan we discussed and let me know if I can assist you in the future.   Referrals/Orders/Follow-Ups/Clinician Recommendations:   This is a list of the screening recommended for you and due dates:  Health Maintenance  Topic Date Due   COVID-19 Vaccine (1) Never done   Pneumonia Vaccine (1 of 2 - PCV) Never done   Zoster (Shingles) Vaccine (1 of 2) Never done   DTaP/Tdap/Td vaccine (3 - Td or Tdap) 07/20/2023   Flu Shot  07/23/2023   Colon Cancer Screening  10/16/2023   Medicare Annual Wellness Visit  12/21/2024   Hepatitis C Screening  Completed   HPV Vaccine  Aged Out    Advanced directives: (Declined) Advance directive discussed with you today. Even though you declined this today, please call our office should you change your mind, and we can give you the proper paperwork for you to fill out.  Next Medicare Annual Wellness Visit scheduled for next year: Yes

## 2024-02-10 ENCOUNTER — Telehealth: Payer: Self-pay | Admitting: Gastroenterology

## 2024-02-10 NOTE — Telephone Encounter (Signed)
Good afternoon Dr. Myrtie Neither     Patients wife called stating that she wanted patient to switch from Dr. Russella Dar to Dr. Rhea Belton. I advised her that patient has not seen Dr. Russella Dar and he last had a colonoscopy with you in 2019. She stated that he has always been a patient of his and that she did not understand. She still requested the transfer to Dr. Terri Piedra. Will you please review and advise on scheduling patient?   Thank you.

## 2024-02-11 NOTE — Telephone Encounter (Signed)
Ok to transfer care to my panel JMP

## 2024-02-11 NOTE — Telephone Encounter (Signed)
Jeremy Wolf,  Transfer of care request to you.  I saw him once as supervising doc in October 2019  for LLQ pain and constipation. Diminutive tubular adenoma on colonoscopy, due for recall. Former Magazine features editor patient.  Fine with me if they want to change and you accept it.  H Danis

## 2024-02-17 ENCOUNTER — Encounter: Payer: Self-pay | Admitting: Internal Medicine

## 2024-02-17 NOTE — Telephone Encounter (Signed)
 Patient has been scheduled for colonoscopy on 4/21 at 9:30 and PV on 4/7 at 4:00

## 2024-03-15 DIAGNOSIS — H31002 Unspecified chorioretinal scars, left eye: Secondary | ICD-10-CM | POA: Diagnosis not present

## 2024-03-15 DIAGNOSIS — H43393 Other vitreous opacities, bilateral: Secondary | ICD-10-CM | POA: Diagnosis not present

## 2024-03-15 DIAGNOSIS — H25812 Combined forms of age-related cataract, left eye: Secondary | ICD-10-CM | POA: Diagnosis not present

## 2024-03-15 DIAGNOSIS — H52223 Regular astigmatism, bilateral: Secondary | ICD-10-CM | POA: Diagnosis not present

## 2024-03-15 DIAGNOSIS — H43813 Vitreous degeneration, bilateral: Secondary | ICD-10-CM | POA: Diagnosis not present

## 2024-03-15 DIAGNOSIS — H524 Presbyopia: Secondary | ICD-10-CM | POA: Diagnosis not present

## 2024-03-15 DIAGNOSIS — H35372 Puckering of macula, left eye: Secondary | ICD-10-CM | POA: Diagnosis not present

## 2024-03-15 DIAGNOSIS — Z9841 Cataract extraction status, right eye: Secondary | ICD-10-CM | POA: Diagnosis not present

## 2024-03-15 DIAGNOSIS — H5203 Hypermetropia, bilateral: Secondary | ICD-10-CM | POA: Diagnosis not present

## 2024-03-15 DIAGNOSIS — H43313 Vitreous membranes and strands, bilateral: Secondary | ICD-10-CM | POA: Diagnosis not present

## 2024-03-15 DIAGNOSIS — Z961 Presence of intraocular lens: Secondary | ICD-10-CM | POA: Diagnosis not present

## 2024-03-28 ENCOUNTER — Ambulatory Visit (AMBULATORY_SURGERY_CENTER): Payer: PPO | Admitting: *Deleted

## 2024-03-28 VITALS — Ht 69.0 in | Wt 210.0 lb

## 2024-03-28 DIAGNOSIS — Z860100 Personal history of colon polyps, unspecified: Secondary | ICD-10-CM

## 2024-03-28 MED ORDER — SUFLAVE 178.7 G PO SOLR: 1.0000 | Freq: Once | ORAL | 0 refills | Status: AC

## 2024-03-28 NOTE — Progress Notes (Signed)
 Pt's name and DOB verified at the beginning of the pre-visit wit 2 identifiers   Pt denies any difficulty with ambulating,sitting, laying down or rolling side to side  Pt has no issues with ambulation   Pt has no issues moving head neck or swallowing  No egg or soy allergy known to patient   No issues known to pt with past sedation with any surgeries or procedures  Pt denies having issues being intubated  No FH of Malignant Hyperthermia  Pt is not on diet pills or shots  Pt is not on home 02   Pt is not on blood thinners   Pt has frequent issues with constipation on and off  RN instructed pt to use Miralax per bottles instructions a week before prep days. Pt states they will  Pt is not on dialysis  Pt denise any abnormal heart rhythms   Pt HX AFIB  Chart not reviewed by CRNA prior to [facility]  Visit by phone  Pt states weight is 210 lb  IInstructions reviewed. Pt given [facility] main # and MD on call # prior to instructions.  Pt states understanding of instructions. Instructed to review again prior to procedure. Pt states they will.   Informed pt that they will receive a text or  call from [facility] regarding there prep med.

## 2024-04-06 ENCOUNTER — Encounter: Payer: Self-pay | Admitting: Internal Medicine

## 2024-04-11 ENCOUNTER — Ambulatory Visit (AMBULATORY_SURGERY_CENTER): Payer: PPO | Admitting: Internal Medicine

## 2024-04-11 ENCOUNTER — Encounter: Payer: Self-pay | Admitting: Internal Medicine

## 2024-04-11 VITALS — BP 115/78 | HR 78 | Temp 97.9°F | Resp 12 | Ht 69.0 in | Wt 210.0 lb

## 2024-04-11 DIAGNOSIS — Z860100 Personal history of colon polyps, unspecified: Secondary | ICD-10-CM

## 2024-04-11 DIAGNOSIS — D122 Benign neoplasm of ascending colon: Secondary | ICD-10-CM | POA: Diagnosis not present

## 2024-04-11 DIAGNOSIS — G4730 Sleep apnea, unspecified: Secondary | ICD-10-CM | POA: Diagnosis not present

## 2024-04-11 DIAGNOSIS — Z1211 Encounter for screening for malignant neoplasm of colon: Secondary | ICD-10-CM

## 2024-04-11 DIAGNOSIS — K5730 Diverticulosis of large intestine without perforation or abscess without bleeding: Secondary | ICD-10-CM | POA: Diagnosis not present

## 2024-04-11 DIAGNOSIS — I4891 Unspecified atrial fibrillation: Secondary | ICD-10-CM | POA: Diagnosis not present

## 2024-04-11 DIAGNOSIS — D125 Benign neoplasm of sigmoid colon: Secondary | ICD-10-CM | POA: Diagnosis not present

## 2024-04-11 DIAGNOSIS — I2510 Atherosclerotic heart disease of native coronary artery without angina pectoris: Secondary | ICD-10-CM | POA: Diagnosis not present

## 2024-04-11 MED ORDER — SODIUM CHLORIDE 0.9 % IV SOLN: 500.0000 mL | Freq: Once | INTRAVENOUS | Status: DC

## 2024-04-11 NOTE — Progress Notes (Signed)
 Pt's states no medical or surgical changes since previsit or office visit.

## 2024-04-11 NOTE — Progress Notes (Signed)
 Sedate, gd SR, tolerated procedure well, VSS, report to RN

## 2024-04-11 NOTE — Patient Instructions (Signed)
 Thank you for letting us  care for your healthcare needs today! Please see handouts regarding Polyps and Diverticulosis.  Resume previous diet and medications. Await pathology results.  YOU HAD AN ENDOSCOPIC PROCEDURE TODAY AT THE [facility]:   Refer to the procedure report that was given to you for any specific questions about what was found during the examination.  If the procedure report does not answer your questions, please call your gastroenterologist to clarify.  If you requested that your care partner not be given the details of your procedure findings, then the procedure report has been included in a sealed envelope for you to review at your convenience later.  YOU SHOULD EXPECT: Some feelings of bloating in the abdomen. Passage of more gas than usual.  Walking can help get rid of the air that was put into your GI tract during the procedure and reduce the bloating. If you had a lower endoscopy (such as a colonoscopy or flexible sigmoidoscopy) you may notice spotting of blood in your stool or on the toilet paper. If you underwent a bowel prep for your procedure, you may not have a normal bowel movement for a few days.  Please Note:  You might notice some irritation and congestion in your nose or some drainage.  This is from the oxygen used during your procedure.  There is no need for concern and it should clear up in a day or so.  SYMPTOMS TO REPORT IMMEDIATELY:  Following lower endoscopy (colonoscopy or flexible sigmoidoscopy):  Excessive amounts of blood in the stool  Significant tenderness or worsening of abdominal pains  Swelling of the abdomen that is new, acute  Fever of 100F or higher  For urgent or emergent issues, a gastroenterologist can be reached at any hour by calling [phone]. Do not use MyChart messaging for urgent concerns.    DIET:  We do recommend a small meal at first, but then you may proceed to your regular diet.  Drink plenty of fluids but you  should avoid alcoholic beverages for 24 hours.  ACTIVITY:  You should plan to take it easy for the rest of today and you should NOT DRIVE or use heavy machinery until tomorrow (because of the sedation medicines used during the test).    FOLLOW UP: Our staff will call the number listed on your records the next business day following your procedure.  We will call around 7:15- 8:00 am to check on you and address any questions or concerns that you may have regarding the information given to you following your procedure. If we do not reach you, we will leave a message.     If any biopsies were taken you will be contacted by phone or by letter within the next 1-3 weeks.  Please call us  at [phone] if you have not heard about the biopsies in 3 weeks.    SIGNATURES/CONFIDENTIALITY: You and/or your care partner have signed paperwork which will be entered into your electronic medical record.  These signatures attest to the fact that that the information above on your After Visit Summary has been reviewed and is understood.  Full responsibility of the confidentiality of this discharge information lies with you and/or your care-partner.

## 2024-04-11 NOTE — Progress Notes (Signed)
 GASTROENTEROLOGY PROCEDURE H&P NOTE   Primary Care Physician: Reginal Capra, MD    Reason for Procedure:   Hx adenoma of colon  Plan:    colonoscopy  Patient is appropriate for endoscopic procedure(s) in the ambulatory ([facility]) setting.  The nature of the procedure, as well as the risks, benefits, and alternatives were carefully and thoroughly reviewed with the patient. Ample time for discussion and questions allowed. The patient understood, was satisfied, and agreed to proceed.     HPI: Jeremy Wolf is a 68 y.o. male who presents for surveillance colonoscopy.  Medical history as below.  Tolerated the prep.  No recent chest pain or shortness of breath.  No abdominal pain today.  Past Medical History:  Diagnosis Date   Allergic rhinitis    Allergy    Atrial fibrillation (HCC) 02/10/2009   Centricity Description: FIBRILLATION, ATRIAL Qualifier: Diagnosis of  By: Lavonne Prairie, MD, Antoine Bathe   Centricity Description: ATRIAL FIBRILLATION Qualifier: Diagnosis of  By: Alyne Babinski MD, Lenis Quin    Blood transfusion without reported diagnosis 2010   after heart surgery   CAD (coronary artery disease)    a. cath 12/09: oD1 50%, EF 65%;  b. negative ETT 7/11   Cataract    removed left eye with lens implant    Constipation    uses Miralax   Heart murmur    History of atrial fibrillation    post op AFib   Hx of colonic polyps    Hyperlipidemia    Mitral valve prolapse    a. with severe regurgitation; s/p MV repair 2/10;  b. 07/2011 Echo: EF 50-55%, mild MR.   Nephrolithiasis    hx of   Patent foramen ovale    a. s/p closure at MV repair in 2010   Plantar fasciitis of left foot 07/06/2012   Premature birth ?    twin  was in hosp 30 days feeding lazy eye small weight    Rheumatic fever with heart involvement    MVP per pt due to this with repair 2010   Sleep apnea    no cpap    Past Surgical History:  Procedure Laterality Date   COLONOSCOPY     HERNIA REPAIR Right 2012   Inguinal  3 years ago   PATENT FORAMEN OVALE CLOSURE  2010   POLYPECTOMY     SPINE SURGERY  12/09/2010   vertebra 5 & 6   THORACOTOMY  2010   Rt miniature for mitral valve repair, quadrangular resection of posterior leaftlet with transposition of  native chordae tendineae x 2 and 34mm Edwards Physio II ring annuloplasty    Prior to Admission medications   Medication Sig Start Date End Date Taking? Authorizing Provider  aspirin EC 81 MG tablet Take 81 mg by mouth daily.   Yes [provider]  Misc Natural Products (RED WINE EXTRACT) CAPS Take by mouth.   Yes [provider]  Multiple Vitamin (MULTIVITAMIN WITH MINERALS) TABS tablet Take 1 tablet by mouth daily.   Yes [provider]  rosuvastatin  (CRESTOR ) 20 MG tablet TAKE 1 TABLET BY MOUTH EVERY DAY 08/31/23  Yes Eilleen Grates, MD  triamcinolone  ointment (KENALOG ) 0.1 % Apply 1 Application topically 2 (two) times daily. Patient not taking: Reported on 04/11/2024 03/12/23   Panosh, Wanda K, MD    Current Outpatient Medications  Medication Sig Dispense Refill   aspirin EC 81 MG tablet Take 81 mg by mouth daily.     Misc Natural Products (  RED WINE EXTRACT) CAPS Take by mouth.     Multiple Vitamin (MULTIVITAMIN WITH MINERALS) TABS tablet Take 1 tablet by mouth daily.     rosuvastatin  (CRESTOR ) 20 MG tablet TAKE 1 TABLET BY MOUTH EVERY DAY 90 tablet 3   triamcinolone  ointment (KENALOG ) 0.1 % Apply 1 Application topically 2 (two) times daily. (Patient not taking: Reported on 04/11/2024) 30 g 3   Current Facility-Administered Medications  Medication Dose Route Frequency Provider Last Rate Last Admin   0.9 %  sodium chloride  infusion  500 mL Intravenous Once Mallori Araque, Amber Bail, MD       Facility-Administered Medications Ordered in Other Visits  Medication Dose Route Frequency Provider Last Rate Last Admin   regadenoson  (LEXISCAN ) injection SOLN 0.4 mg  0.4 mg Intravenous Once Jann Melody, MD        Allergies as of  04/11/2024   (No Known Allergies)    Family History  Problem Relation Age of Onset   Diabetes Mother    Hyperlipidemia Mother    Hyperlipidemia Father    Heart disease Father        heart surgery 30 ? cabd and valve   Cancer Daughter 76       ovarian   Lung cancer Maternal Grandfather        lung   Emphysema Maternal Grandfather    Arthritis Paternal Grandmother    Arthritis Paternal Grandfather    Rheumatic fever Unknown        2 sisters   Other Sister        twin hx of substance use   Colon cancer Neg Hx    Colon polyps Neg Hx    Esophageal cancer Neg Hx    Rectal cancer Neg Hx    Stomach cancer Neg Hx     Social History   Socioeconomic History   Marital status: Married    Spouse name: Not on file   Number of children: 5   Years of education: Not on file   Highest education level: Not on file  Occupational History   Occupation: Truck [employer] doctor  Tobacco Use   Smoking status: Never   Smokeless tobacco: Never  Vaping Use   Vaping status: Never Used  Substance and Sexual Activity   Alcohol use: Yes    Alcohol/week: 0.0 standard drinks of alcohol    Comment: OCC. 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    Sleeps; 7- 8 hours   Married (second marriage) 4 children   Regular exercise- no    Married summer 2007   caffeine 10-12 cups coffee   HH of 2   No pets   Originally from the [place]   Was a twin birth midly  Premature  Fraternal  Twin sister    Social Drivers of Corporate investment banker Strain: Low Risk  (12/22/2023)   Overall Financial Resource Strain (CARDIA)    Difficulty of Paying Living Expenses: Not hard at all  Food Insecurity: No Food Insecurity (12/22/2023)   Hunger Vital Sign    Worried About Running Out of Food in the Last Year: Never true     Ran Out of Food in the Last Year: Never true  Transportation Needs: No  Transportation Needs (12/22/2023)   PRAPARE - Administrator, Civil Service (Medical): No    Lack of Transportation (Non-Medical): No  Physical Activity: Inactive (12/22/2023)   Exercise Vital Sign    Days of Exercise per Week: 0 days    Minutes of Exercise per Session: 0 min  Stress: No Stress Concern Present (12/22/2023)   [employer] of Occupational Health - Occupational Stress Questionnaire    Feeling of Stress : Not at all  Social Connections: Socially Integrated (12/22/2023)   Social Connection and Isolation Panel [NHANES]    Frequency of Communication with Friends and Family: More than three times a week    Frequency of Social Gatherings with Friends and Family: More than three times a week    Attends Religious Services: More than 4 times per year    Active Member of [employer] or Organizations: Yes    Attends Engineer, structural: More than 4 times per year    Marital Status: Married  Catering manager Violence: Not At Risk (12/22/2023)   Humiliation, Afraid, Rape, and Kick questionnaire    Fear of Current or Ex-Partner: No    Emotionally Abused: No    Physically Abused: No    Sexually Abused: No    Physical Exam: Vital signs in last 24 hours: @BP  134/80   Pulse 87   Temp 97.9 F (36.6 C) (Temporal)   Ht 5\' 9"  (1.753 m)   Wt 210 lb (95.3 kg)   SpO2 98%   BMI 31.01 kg/m  GEN: NAD EYE: Sclerae anicteric ENT: MMM CV: Non-tachycardic Pulm: CTA b/l GI: Soft, NT/ND NEURO:  Alert & Oriented x 3   Laurell Pond, MD [facility]  04/11/2024 10:38 AM

## 2024-04-11 NOTE — Op Note (Signed)
 [facility] Patient Name: Jeremy Wolf Procedure Date: 04/11/2024 10:00 AM MRN: [id number] Endoscopist: Nannette Babe , MD, [id number] Age: 68 Referring MD:  Date of Birth: [birth year] Gender: Male Account #: [id number] Procedure:                Colonoscopy Indications:              High risk colon cancer surveillance: Personal                            history of non-advanced adenoma, Last colonoscopy:                            October 2019 (TA x1) Medicines:                Monitored Anesthesia Care Procedure:                Pre-Anesthesia Assessment:                           - Prior to the procedure, a History and Physical                            was performed, and patient medications and                            allergies were reviewed. The patient's tolerance of                            previous anesthesia was also reviewed. The risks                            and benefits of the procedure and the sedation                            options and risks were discussed with the patient.                            All questions were answered, and informed consent                            was obtained. Prior Anticoagulants: The patient has                            taken no anticoagulant or antiplatelet agents. ASA                            Grade Assessment: III - A patient with severe                            systemic disease. After reviewing the risks and                            benefits, the patient was deemed in satisfactory  condition to undergo the procedure.                           After obtaining informed consent, the colonoscope                            was passed under direct vision. Throughout the                            procedure, the patient's blood pressure, pulse, and                            oxygen saturations were monitored continuously. The                            Olympus Scope [id number] was  introduced through the                            anus and advanced to the cecum, identified by                            appendiceal orifice and ileocecal valve. The                            colonoscopy was performed without difficulty. The                            patient tolerated the procedure well. The quality                            of the bowel preparation was good. The ileocecal                            valve, appendiceal orifice, and rectum were                            photographed. Scope In: 10:50:34 AM Scope Out: 11:08:17 AM Scope Withdrawal Time: 0 hours 15 minutes 33 seconds  Total Procedure Duration: 0 hours 17 minutes 43 seconds  Findings:                 The digital rectal exam was normal.                           A 5 mm polyp was found in the ascending colon. The                            polyp was sessile. The polyp was removed with a                            cold snare. Resection and retrieval were complete.                           Two sessile polyps were found in the sigmoid colon.  The polyps were 3 to 4 mm in size. These polyps                            were removed with a cold snare. Resection and                            retrieval were complete.                           A few small-mouthed diverticula were found in the                            sigmoid colon and ascending colon.                           The retroflexed view of the distal rectum and anal                            verge was normal and showed no anal or rectal                            abnormalities. Complications:            No immediate complications. Estimated Blood Loss:     Estimated blood loss was minimal. Impression:               - One 5 mm polyp in the ascending colon, removed                            with a cold snare. Resected and retrieved.                           - Two 3 to 4 mm polyps in the sigmoid colon,                             removed with a cold snare. Resected and retrieved.                           - Mild diverticulosis in the sigmoid colon and in                            the ascending colon.                           - The distal rectum and anal verge are normal on                            retroflexion view. Recommendation:           - Patient has a contact number available for                            emergencies. The signs and symptoms of potential  delayed complications were discussed with the                            patient. Return to normal activities tomorrow.                            Written discharge instructions were provided to the                            patient.                           - Resume previous diet.                           - Continue present medications.                           - Await pathology results.                           - Repeat colonoscopy is recommended for                            surveillance. The colonoscopy date will be                            determined after pathology results from today's                            exam become available for review. Nannette Babe, MD 04/11/2024 11:09:01 AM This report has been signed electronically.

## 2024-04-12 ENCOUNTER — Telehealth: Payer: Self-pay

## 2024-04-12 NOTE — Telephone Encounter (Signed)
  Follow up Call-     04/11/2024    9:47 AM  Call back number  Post procedure Call Back phone  # [phone]  Permission to leave phone message Yes     Patient questions:  Do you have a fever, pain , or abdominal swelling? No. Pain Score  0 *  Have you tolerated food without any problems? Yes.    Have you been able to return to your normal activities? Yes.    Do you have any questions about your discharge instructions: Diet   No. Medications  No. Follow up visit  No.  Do you have questions or concerns about your Care? No.  Actions: * If pain score is 4 or above: No action needed, pain <4.

## 2024-04-13 LAB — SURGICAL PATHOLOGY

## 2024-04-20 ENCOUNTER — Encounter: Payer: Self-pay | Admitting: Internal Medicine

## 2024-04-27 DIAGNOSIS — Z125 Encounter for screening for malignant neoplasm of prostate: Secondary | ICD-10-CM | POA: Diagnosis not present

## 2024-04-27 DIAGNOSIS — M25551 Pain in right hip: Secondary | ICD-10-CM | POA: Diagnosis not present

## 2024-04-27 DIAGNOSIS — R252 Cramp and spasm: Secondary | ICD-10-CM | POA: Diagnosis not present

## 2024-04-27 DIAGNOSIS — E782 Mixed hyperlipidemia: Secondary | ICD-10-CM | POA: Diagnosis not present

## 2024-04-27 DIAGNOSIS — Z0000 Encounter for general adult medical examination without abnormal findings: Secondary | ICD-10-CM | POA: Diagnosis not present

## 2024-04-27 DIAGNOSIS — M19049 Primary osteoarthritis, unspecified hand: Secondary | ICD-10-CM | POA: Diagnosis not present

## 2024-05-25 DIAGNOSIS — R768 Other specified abnormal immunological findings in serum: Secondary | ICD-10-CM | POA: Diagnosis not present

## 2024-05-25 DIAGNOSIS — E7841 Elevated Lipoprotein(a): Secondary | ICD-10-CM | POA: Diagnosis not present

## 2024-05-25 DIAGNOSIS — Z952 Presence of prosthetic heart valve: Secondary | ICD-10-CM | POA: Diagnosis not present

## 2024-05-25 DIAGNOSIS — R739 Hyperglycemia, unspecified: Secondary | ICD-10-CM | POA: Diagnosis not present

## 2024-05-25 DIAGNOSIS — E782 Mixed hyperlipidemia: Secondary | ICD-10-CM | POA: Diagnosis not present

## 2024-05-25 DIAGNOSIS — Z0000 Encounter for general adult medical examination without abnormal findings: Secondary | ICD-10-CM | POA: Diagnosis not present

## 2024-06-03 ENCOUNTER — Telehealth: Payer: Self-pay | Admitting: Cardiology

## 2024-06-03 NOTE — Telephone Encounter (Signed)
 Returned call- informed that the patient needs an appt with Dr Lavonne Prairie. Appt scheduled for 08/10/24. They can discuss an Echo at that time, but I will also send this message to the provider.

## 2024-06-03 NOTE — Telephone Encounter (Signed)
 Pt spouse called in asking if pt needs to have an echo this year. Please advise.

## 2024-06-09 NOTE — Telephone Encounter (Signed)
 Spoke with pt and notified him that Dr. Lavonne Prairie will decide if he needs an echo when he see him at his upcoming appointment. Pt verbalized understanding. All questions if any were answered.

## 2024-07-12 ENCOUNTER — Emergency Department (HOSPITAL_BASED_OUTPATIENT_CLINIC_OR_DEPARTMENT_OTHER)

## 2024-07-12 ENCOUNTER — Emergency Department (HOSPITAL_BASED_OUTPATIENT_CLINIC_OR_DEPARTMENT_OTHER)
Admission: EM | Admit: 2024-07-12 | Discharge: 2024-07-13 | Disposition: A | Discharge status: Home / Self Care | Attending: Emergency Medicine | Admitting: Emergency Medicine

## 2024-07-12 ENCOUNTER — Emergency Department (HOSPITAL_BASED_OUTPATIENT_CLINIC_OR_DEPARTMENT_OTHER): Admitting: Radiology

## 2024-07-12 ENCOUNTER — Other Ambulatory Visit: Payer: Self-pay

## 2024-07-12 ENCOUNTER — Encounter (HOSPITAL_BASED_OUTPATIENT_CLINIC_OR_DEPARTMENT_OTHER): Payer: Self-pay | Admitting: Emergency Medicine

## 2024-07-12 DIAGNOSIS — M7989 Other specified soft tissue disorders: Secondary | ICD-10-CM | POA: Diagnosis not present

## 2024-07-12 DIAGNOSIS — L03116 Cellulitis of left lower limb: Secondary | ICD-10-CM | POA: Insufficient documentation

## 2024-07-12 DIAGNOSIS — Z7982 Long term (current) use of aspirin: Secondary | ICD-10-CM | POA: Insufficient documentation

## 2024-07-12 DIAGNOSIS — M25562 Pain in left knee: Secondary | ICD-10-CM | POA: Diagnosis not present

## 2024-07-12 DIAGNOSIS — M79605 Pain in left leg: Secondary | ICD-10-CM | POA: Diagnosis present

## 2024-07-12 DIAGNOSIS — M79652 Pain in left thigh: Secondary | ICD-10-CM | POA: Diagnosis not present

## 2024-07-12 DIAGNOSIS — R21 Rash and other nonspecific skin eruption: Secondary | ICD-10-CM | POA: Diagnosis not present

## 2024-07-12 NOTE — ED Triage Notes (Signed)
 Pain in left leg inner knee Pulling feeling,  Some redness/ rash in area Noticed today

## 2024-07-13 ENCOUNTER — Telehealth: Payer: Self-pay

## 2024-07-13 MED ORDER — CEFTRIAXONE SODIUM 1 G IJ SOLR
1.0000 g | Freq: Once | INTRAMUSCULAR | Status: AC
  Administered 2024-07-13: 1 g via INTRAMUSCULAR
  Filled 2024-07-13: qty 10

## 2024-07-13 MED ORDER — DOXYCYCLINE HYCLATE 100 MG PO CAPS: 100.0000 mg | ORAL_CAPSULE | Freq: Two times a day (BID) | ORAL | 0 refills | Status: AC

## 2024-07-13 MED ORDER — DOXYCYCLINE HYCLATE 100 MG PO TABS
100.0000 mg | ORAL_TABLET | Freq: Once | ORAL | Status: AC
  Administered 2024-07-13: 100 mg via ORAL
  Filled 2024-07-13: qty 1

## 2024-07-13 MED ORDER — CEFDINIR 300 MG PO CAPS: 300.0000 mg | ORAL_CAPSULE | Freq: Two times a day (BID) | ORAL | 0 refills | Status: AC

## 2024-07-13 NOTE — Transitions of Care (Post Inpatient/ED Visit) (Signed)
   07/13/2024  Name: Jeremy Wolf MRN: [id number] DOB: 07-12-1956  Today's TOC FU Call Status: Today's TOC FU Call Status:: Successful TOC FU Call Completed TOC FU Call Complete Date: 07/13/24 Patient's Name and Date of Birth confirmed.  Transition Care Management Follow-up Telephone Call Date of Discharge: 07/13/24 Discharge Facility: [facility] ([facility] Type of Discharge: Emergency Department Reason for ED Visit: Other: (Cellulitis) How have you been since you were released from the hospital?: Same Any questions or concerns?: No  Items Reviewed: Did you receive and understand the discharge instructions provided?: Yes Medications obtained,verified, and reconciled?: Yes (Medications Reviewed) Any new allergies since your discharge?: No Dietary orders reviewed?: NA Do you have support at home?: Yes People in Home [RPT]: spouse  Medications Reviewed Today: Medications Reviewed Today     Reviewed by Dionisio Camelia PARAS, RN (Registered Nurse) on 07/13/24 at 1315  Med List Status: <None>   Medication Order Taking? Sig Documenting Provider Last Dose Status Informant  aspirin EC 81 MG tablet [id number] Yes Take 81 mg by mouth daily. [provider]  Active   cefdinir  (OMNICEF ) 300 MG capsule [id number] Yes Take 1 capsule (300 mg total) by mouth 2 (two) times daily. Haze Lonni PARAS, MD  Active   doxycycline  (VIBRAMYCIN ) 100 MG capsule [id number] Yes Take 1 capsule (100 mg total) by mouth 2 (two) times daily. Haze Lonni PARAS, MD  Active   Misc Natural Products (RED WINE EXTRACT) CAPS [id number] Yes Take by mouth. [provider]  Active   Multiple Vitamin (MULTIVITAMIN WITH MINERALS) TABS tablet [id number] Yes Take 1 tablet by mouth daily. [provider]  Active   regadenoson  (LEXISCAN ) injection SOLN 0.4 mg [id number]   Santo Kelly A, MD  Active   rosuvastatin  (CRESTOR ) 20 MG tablet [id number] Yes TAKE 1 TABLET BY MOUTH EVERY DAY  Lavona Lynwood, MD  Active   triamcinolone  ointment (KENALOG ) 0.1 % [id number]  Apply 1 Application topically 2 (two) times daily.  Patient not taking: Reported on 07/13/2024   Charlett Apolinar POUR, MD  Active             Home Care and Equipment/Supplies: Were Home Health Services Ordered?: No Any new equipment or medical supplies ordered?: No  Functional Questionnaire: Do you need assistance with bathing/showering or dressing?: Yes Do you need assistance with meal preparation?: Yes Do you need assistance with eating?: Yes Do you have difficulty maintaining continence: Yes Do you need assistance with getting out of bed/getting out of a chair/moving?: Yes Do you have difficulty managing or taking your medications?: Yes  Follow up appointments reviewed: PCP Follow-up appointment confirmed?: Yes Date of PCP follow-up appointment?: 07/14/24 Follow-up Provider: [facility] Follow-up appointment confirmed?: NA Do you need transportation to your follow-up appointment?: No Do you understand care options if your condition(s) worsen?: Yes-patient verbalized understanding  Spoke with patient regarding his recent ED visit for cellulitis. Patient states he is changing his PCP to [facility]. Patient voiced he was unhappy with Dr. Charlett. Patient did confirm he has appointmen ton 07/14/2024 with new PCP to follow up from ED visit. Patient states he is getting new prescribed medication today and will begin. Patien states the redness on his leg has gotten larger. Encouraged patient to keep appointment with new PCP. Phone got disconnected with patient at the end of the call.    SIGNATURE: Camelia Dionisio, BSN, RN

## 2024-07-13 NOTE — ED Provider Notes (Signed)
 [place] EMERGENCY DEPARTMENT AT [facility] Provider Note   CSN: [id number] Arrival date & time: 07/12/24  2008     Patient presents with: Leg Pain   Jeremy Wolf is a 68 y.o. male.   Presents to the emergency department for evaluation of pain, redness, swelling on the midportion of the left leg.  No known injury.  Patient reports that at first it is felt like a pulling of the skin but now the area is swollen and red.       Prior to Admission medications   Medication Sig Start Date End Date Taking? Authorizing Provider  cefdinir  (OMNICEF ) 300 MG capsule Take 1 capsule (300 mg total) by mouth 2 (two) times daily. 07/13/24  Yes Tandre Conly, Lonni PARAS, MD  doxycycline  (VIBRAMYCIN ) 100 MG capsule Take 1 capsule (100 mg total) by mouth 2 (two) times daily. 07/13/24  Yes Demetress Tift, Lonni PARAS, MD  aspirin EC 81 MG tablet Take 81 mg by mouth daily.    [provider]  Misc Natural Products (RED WINE EXTRACT) CAPS Take by mouth.    [provider]  Multiple Vitamin (MULTIVITAMIN WITH MINERALS) TABS tablet Take 1 tablet by mouth daily.    [provider]  rosuvastatin  (CRESTOR ) 20 MG tablet TAKE 1 TABLET BY MOUTH EVERY DAY 08/31/23   Lavona Agent, MD  triamcinolone  ointment (KENALOG ) 0.1 % Apply 1 Application topically 2 (two) times daily. Patient not taking: Reported on 04/11/2024 03/12/23   Panosh, Wanda K, MD    Allergies: Patient has no known allergies.    Review of Systems  Updated Vital Signs BP (!) 153/87 (BP Location: Right Arm)   Pulse 87   Temp 98.1 F (36.7 C) (Oral)   Resp 18   SpO2 96%   Physical Exam Vitals and nursing note reviewed.  Constitutional:      General: He is not in acute distress.    Appearance: He is well-developed.  HENT:     Head: Normocephalic and atraumatic.     Mouth/Throat:     Mouth: Mucous membranes are moist.  Eyes:     General: Vision grossly intact. Gaze aligned appropriately.     Extraocular  Movements: Extraocular movements intact.     Conjunctiva/sclera: Conjunctivae normal.  Cardiovascular:     Rate and Rhythm: Normal rate and regular rhythm.     Pulses: Normal pulses.          Dorsalis pedis pulses are 2+ on the left side.     Heart sounds: Normal heart sounds, S1 normal and S2 normal. No murmur heard.    No friction rub. No gallop.  Pulmonary:     Effort: Pulmonary effort is normal. No respiratory distress.     Breath sounds: Normal breath sounds.  Abdominal:     Palpations: Abdomen is soft.     Tenderness: There is no abdominal tenderness. There is no guarding or rebound.     Hernia: No hernia is present.  Musculoskeletal:     Cervical back: Full passive range of motion without pain, normal range of motion and neck supple. No pain with movement, spinous process tenderness or muscular tenderness. Normal range of motion.     Left knee: No swelling, deformity or effusion. Normal range of motion. No tenderness.     Right lower leg: No edema.     Left lower leg: Edema present.       Legs:  Skin:    General: Skin is warm and dry.  Capillary Refill: Capillary refill takes less than 2 seconds.     Findings: No ecchymosis, erythema, lesion or wound.  Neurological:     Mental Status: He is alert and oriented to person, place, and time.     GCS: GCS eye subscore is 4. GCS verbal subscore is 5. GCS motor subscore is 6.     Cranial Nerves: Cranial nerves 2-12 are intact.     Sensory: Sensation is intact.     Motor: Motor function is intact. No weakness or abnormal muscle tone.     Coordination: Coordination is intact.  Psychiatric:        Mood and Affect: Mood normal.        Speech: Speech normal.        Behavior: Behavior normal.     (all labs ordered are listed, but only abnormal results are displayed) Labs Reviewed - No data to display  EKG: None  Radiology: DG Knee Complete 4 Views Left Result Date: 07/12/2024 EXAM: 4 VIEW(S) XRAY OF THE LEFT KNEE  07/12/2024 11:02:00 PM COMPARISON: None available. CLINICAL HISTORY: Pain in left leg inner knee; Tight, Pulling feeling; Some redness/rash in area; Noticed today. FINDINGS: BONES AND JOINTS: No acute fracture. No focal osseous lesion. No joint dislocation. No significant joint effusion. No significant degenerative changes. SOFT TISSUES: The soft tissues are unremarkable. IMPRESSION: 1. No significant abnormality. Electronically signed by: Pinkie Pebbles MD 07/12/2024 11:17 PM EDT RP Workstation: [id number]   US  Venous Img Lower Unilateral Left Result Date: 07/12/2024 CLINICAL DATA:  Medial distal left thigh pain, swelling and redness. EXAM: LEFT LOWER EXTREMITY VENOUS DOPPLER ULTRASOUND TECHNIQUE: Gray-scale sonography with compression, as well as color and duplex ultrasound, were performed to evaluate the deep venous system(s) from the level of the common femoral vein through the popliteal and proximal calf veins. COMPARISON:  None Available. FINDINGS: VENOUS Normal compressibility of the common femoral, superficial femoral, and popliteal veins, as well as the visualized calf veins. Visualized portions of profunda femoral vein and great saphenous vein unremarkable, including the greater saphenous vein in the area of concern. No filling defects to suggest DVT on grayscale or color Doppler imaging. Doppler waveforms show normal direction of venous flow, normal respiratory plasticity and response to augmentation. Limited views of the contralateral common femoral vein are unremarkable. OTHER None. Limitations: none IMPRESSION: Negative. Electronically Signed   By: Francis Quam M.D.   On: 07/12/2024 22:15     Procedures   Medications Ordered in the ED  cefTRIAXone  (ROCEPHIN ) injection 1 g (has no administration in time range)  doxycycline  (VIBRA -TABS) tablet 100 mg (has no administration in time range)                                    Medical Decision Making  Differential diagnosis considered  includes, but not limited to: Cellulitis; insect bite; phlebitis; DVT  Presents with area of tenderness, erythema on the distal portion of the posterior left thigh.  Area is slightly indurated to the touch.  No area of fluctuance.  Patient appears well, afebrile, vital signs normal.  No signs of sepsis.  X-ray does not show any abnormality.  Patient has normal range of motion of the knee, no concern for septic arthritis.  There is some lower extremity edema, likely dependent from the upper leg swelling.  He has good pulses, no concern for ischemic limb.  DVT study is negative.  Will treat  as possible cellulitis.  Will mark the skin, return if there is significant progression.     Final diagnoses:  Cellulitis of left lower extremity    ED Discharge Orders          Ordered    cefdinir  (OMNICEF ) 300 MG capsule  2 times daily        07/13/24 0040    doxycycline  (VIBRAMYCIN ) 100 MG capsule  2 times daily        07/13/24 0040               Haze Lonni PARAS, MD 07/13/24 0040

## 2024-07-14 DIAGNOSIS — L03116 Cellulitis of left lower limb: Secondary | ICD-10-CM | POA: Diagnosis not present

## 2024-07-14 DIAGNOSIS — R600 Localized edema: Secondary | ICD-10-CM | POA: Diagnosis not present

## 2024-08-07 NOTE — Progress Notes (Unsigned)
  Cardiology Office Note:   Date:  08/10/2024  ID:  Jeremy Wolf, DOB 07/24/1956, MRN [id number] PCP: Verdia Lombard, MD  [facility] Providers Cardiologist:  Lynwood Schilling, MD {  History of Present Illness:   Jeremy Wolf is a 68 y.o. male who presents for followup of mitral valve repair.  He was seen in our office in 2024 and he had chest pain.  He had a negative perfusion study.  He had palpitations and had a monitor that demonstrated some infrequent SVT.     Since I last saw her she has done well.   His mitral valve surgery was in 2010. The patient denies any new symptoms such as chest discomfort, neck or arm discomfort. There has been no new shortness of breath, PND or orthopnea. There have been no reported palpitations, presyncope or syncope.  He does not exercise routinely.  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  With his physical activities denies any cardiovascular symptoms except occasional palpitations.  ROS: As stated in the HPI and negative for all other systems.  Studies Reviewed:    EKG:   EKG Interpretation Date/Time:  Wednesday August 10 2024 09:02:12 EDT Ventricular Rate:  67 PR Interval:  232 QRS Duration:  88 QT Interval:  428 QTC Calculation: 452 R Axis:   -31  Text Interpretation: Sinus rhythm with marked sinus arrhythmia with 1st degree A-V block Left axis deviation When compared with ECG of 25-May-2023 09:04, Rate is slower.  Mobitz 1 heart block Confirmed by Schilling Lynwood ([id number]) on 08/10/2024 9:07:19 AM     Risk Assessment/Calculations:              Physical Exam:   VS:  BP 120/74 (BP Location: Left Arm, Patient Position: Sitting)   Pulse 61   Ht 5' 9 (1.753 m)   Wt 211 lb (95.7 kg)   SpO2 97%   BMI 31.16 kg/m    Wt Readings from Last 3 Encounters:  08/10/24 211 lb (95.7 kg)  04/11/24 210 lb (95.3 kg)  03/28/24 210 lb (95.3 kg)     GEN: Well nourished, well developed in no  acute distress NECK: No JVD; No carotid bruits CARDIAC: RRR, no murmurs, rubs, gallops RESPIRATORY:  Clear to auscultation without rales, wheezing or rhonchi  ABDOMEN: Soft, non-tender, non-distended EXTREMITIES:  No edema; No deformity   ASSESSMENT AND PLAN:   Mitral Valve Repair -  This was stable in 2023.  No echocardiography is indicated.  No change in therapy.  He understands endocarditis prophylaxis.  He has no symptoms related to this.  No change in therapy.  Mobitz I Heart Block -   CAD - The patient has no new sypmtoms.  No further cardiovascular testing is indicated.  We will continue with aggressive risk reduction and meds as listed.  DYSLIPIDEMIA - LDL is 92.  I will increase the Crestor  to 40 mg and repeat a lipid profile in 3 months.  He had nonobstructive coronary disease 15 years ago.  We should be aggressive with primary prevention.       Follow up with me in 1 year.  Signed, Lynwood Schilling, MD

## 2024-08-10 ENCOUNTER — Encounter: Payer: Self-pay | Admitting: Cardiology

## 2024-08-10 ENCOUNTER — Ambulatory Visit: Attending: Cardiology | Admitting: Cardiology

## 2024-08-10 VITALS — BP 120/74 | HR 61 | Ht 69.0 in | Wt 211.0 lb

## 2024-08-10 DIAGNOSIS — E785 Hyperlipidemia, unspecified: Secondary | ICD-10-CM

## 2024-08-10 DIAGNOSIS — M7989 Other specified soft tissue disorders: Secondary | ICD-10-CM | POA: Diagnosis not present

## 2024-08-10 DIAGNOSIS — I25119 Atherosclerotic heart disease of native coronary artery with unspecified angina pectoris: Secondary | ICD-10-CM | POA: Diagnosis not present

## 2024-08-10 DIAGNOSIS — I059 Rheumatic mitral valve disease, unspecified: Secondary | ICD-10-CM

## 2024-08-10 MED ORDER — ROSUVASTATIN CALCIUM 40 MG PO TABS
40.0000 mg | ORAL_TABLET | Freq: Every day | ORAL | 3 refills | Status: AC
Start: 2024-08-10 — End: ?

## 2024-08-10 NOTE — Patient Instructions (Signed)
 Medication Instructions:  Increase Crestor  to 40 mg once daily *If you need a refill on your cardiac medications before your next appointment, please call your pharmacy*  Lab Work: Fasting lipid panel in 3 months at [facility] If you have labs (blood work) drawn today and your tests are completely normal, you will receive your results only by: MyChart Message (if you have MyChart) OR A paper copy in the mail If you have any lab test that is abnormal or we need to change your treatment, we will call you to review the results.  Testing/Procedures: NONE  Follow-Up: At [facility], you and your health needs are our priority.  As part of our continuing mission to provide you with exceptional heart care, our providers are all part of one team.  This team includes your primary Cardiologist (physician) and Advanced Practice Providers or APPs (Physician Assistants and Nurse Practitioners) who all work together to provide you with the care you need, when you need it.  Your next appointment:   1 year(s)  Provider:   Lynwood Schilling, MD    We recommend signing up for the patient portal called MyChart.  Sign up information is provided on this After Visit Summary.  MyChart is used to connect with patients for Virtual Visits (Telemedicine).  Patients are able to view lab/test results, encounter notes, upcoming appointments, etc.  Non-urgent messages can be sent to your provider as well.   To learn more about what you can do with MyChart, go to [email or web address].

## 2024-08-15 DIAGNOSIS — M2550 Pain in unspecified joint: Secondary | ICD-10-CM | POA: Diagnosis not present

## 2024-08-15 DIAGNOSIS — M25551 Pain in right hip: Secondary | ICD-10-CM | POA: Diagnosis not present

## 2024-08-15 DIAGNOSIS — M79643 Pain in unspecified hand: Secondary | ICD-10-CM | POA: Diagnosis not present

## 2024-08-15 DIAGNOSIS — M1990 Unspecified osteoarthritis, unspecified site: Secondary | ICD-10-CM | POA: Diagnosis not present

## 2024-08-15 DIAGNOSIS — R252 Cramp and spasm: Secondary | ICD-10-CM | POA: Diagnosis not present

## 2024-08-15 DIAGNOSIS — M79641 Pain in right hand: Secondary | ICD-10-CM | POA: Diagnosis not present

## 2024-08-15 DIAGNOSIS — M79642 Pain in left hand: Secondary | ICD-10-CM | POA: Diagnosis not present

## 2024-08-15 DIAGNOSIS — L853 Xerosis cutis: Secondary | ICD-10-CM | POA: Diagnosis not present

## 2024-08-15 DIAGNOSIS — M25512 Pain in left shoulder: Secondary | ICD-10-CM | POA: Diagnosis not present

## 2024-08-15 DIAGNOSIS — E559 Vitamin D deficiency, unspecified: Secondary | ICD-10-CM | POA: Diagnosis not present

## 2024-08-15 DIAGNOSIS — R768 Other specified abnormal immunological findings in serum: Secondary | ICD-10-CM | POA: Diagnosis not present

## 2024-08-15 DIAGNOSIS — L609 Nail disorder, unspecified: Secondary | ICD-10-CM | POA: Diagnosis not present

## 2024-08-15 DIAGNOSIS — M25552 Pain in left hip: Secondary | ICD-10-CM | POA: Diagnosis not present

## 2024-08-15 DIAGNOSIS — M549 Dorsalgia, unspecified: Secondary | ICD-10-CM | POA: Diagnosis not present

## 2024-09-26 DIAGNOSIS — R252 Cramp and spasm: Secondary | ICD-10-CM | POA: Diagnosis not present

## 2024-09-26 DIAGNOSIS — E559 Vitamin D deficiency, unspecified: Secondary | ICD-10-CM | POA: Diagnosis not present

## 2024-09-26 DIAGNOSIS — M2550 Pain in unspecified joint: Secondary | ICD-10-CM | POA: Diagnosis not present

## 2024-09-26 DIAGNOSIS — R7689 Other specified abnormal immunological findings in serum: Secondary | ICD-10-CM | POA: Diagnosis not present

## 2024-09-26 DIAGNOSIS — Z1589 Genetic susceptibility to other disease: Secondary | ICD-10-CM | POA: Diagnosis not present

## 2024-09-26 DIAGNOSIS — L609 Nail disorder, unspecified: Secondary | ICD-10-CM | POA: Diagnosis not present

## 2024-09-26 DIAGNOSIS — L853 Xerosis cutis: Secondary | ICD-10-CM | POA: Diagnosis not present

## 2024-09-26 DIAGNOSIS — M25551 Pain in right hip: Secondary | ICD-10-CM | POA: Diagnosis not present

## 2024-09-26 DIAGNOSIS — M79643 Pain in unspecified hand: Secondary | ICD-10-CM | POA: Diagnosis not present

## 2024-09-26 DIAGNOSIS — M1990 Unspecified osteoarthritis, unspecified site: Secondary | ICD-10-CM | POA: Diagnosis not present

## 2024-09-26 DIAGNOSIS — M549 Dorsalgia, unspecified: Secondary | ICD-10-CM | POA: Diagnosis not present

## 2024-09-26 DIAGNOSIS — M25512 Pain in left shoulder: Secondary | ICD-10-CM | POA: Diagnosis not present

## 2024-11-24 DIAGNOSIS — E782 Mixed hyperlipidemia: Secondary | ICD-10-CM | POA: Diagnosis not present

## 2024-11-24 DIAGNOSIS — R739 Hyperglycemia, unspecified: Secondary | ICD-10-CM | POA: Diagnosis not present

## 2024-11-24 DIAGNOSIS — E7841 Elevated Lipoprotein(a): Secondary | ICD-10-CM | POA: Diagnosis not present
